# Patient Record
Sex: Female | Born: 1950
Health system: Southern US, Community
[De-identification: ages and names within clinical notes are randomized; demographics above are authoritative.]

## PROBLEM LIST (undated history)

## (undated) DIAGNOSIS — M810 Age-related osteoporosis without current pathological fracture: Secondary | ICD-10-CM

## (undated) DIAGNOSIS — M549 Dorsalgia, unspecified: Secondary | ICD-10-CM

## (undated) DIAGNOSIS — K829 Disease of gallbladder, unspecified: Secondary | ICD-10-CM

## (undated) DIAGNOSIS — E7800 Pure hypercholesterolemia, unspecified: Secondary | ICD-10-CM

## (undated) DIAGNOSIS — Z923 Personal history of irradiation: Secondary | ICD-10-CM

## (undated) DIAGNOSIS — K219 Gastro-esophageal reflux disease without esophagitis: Secondary | ICD-10-CM

## (undated) DIAGNOSIS — R609 Edema, unspecified: Secondary | ICD-10-CM

## (undated) DIAGNOSIS — K5790 Diverticulosis of intestine, part unspecified, without perforation or abscess without bleeding: Secondary | ICD-10-CM

## (undated) DIAGNOSIS — I10 Essential (primary) hypertension: Secondary | ICD-10-CM

## (undated) HISTORY — DX: Edema, unspecified: R60.9

## (undated) HISTORY — PX: BREAST BIOPSY: SHX20

## (undated) HISTORY — DX: Dorsalgia, unspecified: M54.9

## (undated) HISTORY — DX: Disease of gallbladder, unspecified: K82.9

## (undated) HISTORY — DX: Essential (primary) hypertension: I10

## (undated) HISTORY — PX: ABDOMINAL HYSTERECTOMY: SHX81

## (undated) HISTORY — DX: Personal history of irradiation: Z92.3

## (undated) HISTORY — PX: CHOLECYSTECTOMY: SHX55

---

## 1999-05-15 ENCOUNTER — Other Ambulatory Visit: Admission: RE | Admit: 1999-05-15 | Discharge: 1999-05-15 | Payer: Self-pay | Admitting: Family Medicine

## 2000-07-25 ENCOUNTER — Ambulatory Visit (HOSPITAL_COMMUNITY): Admission: RE | Admit: 2000-07-25 | Discharge: 2000-07-25 | Payer: Self-pay | Admitting: Gastroenterology

## 2000-07-25 ENCOUNTER — Encounter: Payer: Self-pay | Admitting: Gastroenterology

## 2002-08-04 ENCOUNTER — Encounter: Admission: RE | Admit: 2002-08-04 | Discharge: 2002-08-04 | Payer: Self-pay | Admitting: Family Medicine

## 2002-08-04 ENCOUNTER — Encounter: Payer: Self-pay | Admitting: Family Medicine

## 2002-08-10 ENCOUNTER — Encounter: Admission: RE | Admit: 2002-08-10 | Discharge: 2002-08-10 | Payer: Self-pay | Admitting: Family Medicine

## 2002-08-10 ENCOUNTER — Encounter: Payer: Self-pay | Admitting: Family Medicine

## 2002-10-18 ENCOUNTER — Encounter (INDEPENDENT_AMBULATORY_CARE_PROVIDER_SITE_OTHER): Payer: Self-pay

## 2002-10-18 ENCOUNTER — Ambulatory Visit (HOSPITAL_COMMUNITY): Admission: RE | Admit: 2002-10-18 | Discharge: 2002-10-18 | Payer: Self-pay | Admitting: Gastroenterology

## 2004-01-17 ENCOUNTER — Encounter (INDEPENDENT_AMBULATORY_CARE_PROVIDER_SITE_OTHER): Payer: Self-pay | Admitting: Specialist

## 2004-01-17 ENCOUNTER — Observation Stay (HOSPITAL_COMMUNITY): Admission: RE | Admit: 2004-01-17 | Discharge: 2004-01-18 | Payer: Self-pay | Admitting: *Deleted

## 2004-12-12 ENCOUNTER — Encounter: Admission: RE | Admit: 2004-12-12 | Discharge: 2004-12-12 | Payer: Self-pay | Admitting: Family Medicine

## 2005-01-10 ENCOUNTER — Emergency Department (HOSPITAL_COMMUNITY): Admission: EM | Admit: 2005-01-10 | Discharge: 2005-01-11 | Payer: Self-pay | Admitting: Emergency Medicine

## 2006-05-19 ENCOUNTER — Other Ambulatory Visit: Admission: RE | Admit: 2006-05-19 | Discharge: 2006-05-19 | Payer: Self-pay | Admitting: Family Medicine

## 2006-06-11 ENCOUNTER — Encounter: Admission: RE | Admit: 2006-06-11 | Discharge: 2006-06-11 | Payer: Self-pay | Admitting: Family Medicine

## 2007-06-18 ENCOUNTER — Encounter: Admission: RE | Admit: 2007-06-18 | Discharge: 2007-06-18 | Payer: Self-pay | Admitting: Family Medicine

## 2008-08-17 ENCOUNTER — Encounter: Admission: RE | Admit: 2008-08-17 | Discharge: 2008-08-17 | Payer: Self-pay | Admitting: Family Medicine

## 2009-08-21 ENCOUNTER — Encounter: Admission: RE | Admit: 2009-08-21 | Discharge: 2009-08-21 | Payer: Self-pay | Admitting: Family Medicine

## 2010-07-17 ENCOUNTER — Ambulatory Visit: Payer: Self-pay | Admitting: Family Medicine

## 2010-08-07 ENCOUNTER — Ambulatory Visit: Payer: Self-pay | Admitting: Family Medicine

## 2010-08-22 ENCOUNTER — Encounter: Admission: RE | Admit: 2010-08-22 | Discharge: 2010-08-22 | Payer: Self-pay | Admitting: Obstetrics and Gynecology

## 2011-03-22 NOTE — Op Note (Signed)
NAME:  Jennifer Tyler, Jennifer Tyler                             ACCOUNT NO.:  [id number]   MEDICAL RECORD NO.:  [id number]                   PATIENT TYPE:  OBV   LOCATION:  0357                                 FACILITY:  [facility]   PHYSICIAN:  Vikki Ports, M.D.         DATE OF BIRTH:  August 27, 1951   DATE OF PROCEDURE:  01/17/2004  DATE OF DISCHARGE:                                 OPERATIVE REPORT   PREOPERATIVE DIAGNOSIS:  Symptomatic cholelithiasis.   POSTOPERATIVE DIAGNOSIS:  Symptomatic cholelithiasis.   PROCEDURE:  Laparoscopic cholecystectomy.   SURGEON:  Vikki Ports, M.D.   ASSESSMENT:  [name]. Earlene Plater, M.D.   ANESTHESIA:  General.   PROCEDURE:  Patient was taken to the operating room and placed in a supine  position.  After adequate general anesthesia was induced using an  endotracheal tube, the abdomen was prepped and draped in a normal sterile  fashion.  Using a supraumbilical incision, I dissected down to the fascia.  This was opened vertically.  An 0 Vicryl purse-string suture was placed  around the fascial defect.  A Hasson trocar was placed in the abdomen, and  the abdomen was insufflated with continuous-flow carbon dioxide.  Under  direct visualization, an 11 mm port was placed in the subxiphoid region, and  two 5 mm ports were placed in the right abdomen.  The gallbladder was  identified and retracted cephalad.  The cystic duct at the junction of the  gallbladder was easily visualized and clipped.  The small ductotomy was  made; however, on inspecting the lumen of the cystic duct, it would not even  allow the pin point of the right angle dissector.  It measured clearly less  than 1 mm, and no catheter could be placed within it distally.  For this  reason, I felt it highly unlikely that the patient would have any  choledocholithiasis, and since cholangiogram could not be performed, the  cystic duct was then triply clipped and divided.  The cystic artery was  identified and dissected in all directions, triply clipped and divided.  The  gallbladder was taken off the gallbladder bed and using Bovie  electrocautery, we removed the umbilical port.  Adequate hemostasis was  insured.  The fascial defect was closed with 0 Vicryl purse-string sutures.  Skin increases were closed with staples.  Patient tolerated the procedure  well and went to the PACU in good condition.                                               Vikki Ports, M.D.    KRH/MEDQ  D:  01/17/2004  T:  01/17/2004  Job:  [id number]

## 2011-03-22 NOTE — Op Note (Signed)
   NAME:  Jennifer Tyler, Jennifer Tyler                             ACCOUNT NO.:  [id number]   MEDICAL RECORD NO.:  [id number]                   PATIENT TYPE:  AMB   LOCATION:  ENDO                                 FACILITY:  [facility]   PHYSICIAN:  John C. Madilyn Fireman, M.D.                 DATE OF BIRTH:  07-Nov-1950   DATE OF PROCEDURE:  10/18/2002  DATE OF DISCHARGE:                                 OPERATIVE REPORT   PROCEDURE:  Colonoscopy with polypectomy.   INDICATIONS FOR PROCEDURE:  Heme-positive stool.   DESCRIPTION OF PROCEDURE:  The patient was placed in the left lateral  decubitus position and placed on the pulse monitor with continuous low-flow  oxygen delivered by nasal cannula.  She was sedated with 75 mcg IV fentanyl  and 6 mg IV Demerol.  The Olympus video colonoscope was inserted into the  rectum and advanced to the cecum, confirmed by transillumination at  McBurney's point and visualization of the ileocecal valve and appendiceal  orifice.  The prep was excellent.  The cecum, ascending, transverse, and  descending colon all appeared normal with no masses, polyps, diverticula, or  other mucosal abnormalities.  Within the sigmoid colon, there was a large,  irregular, friable 2-2.5 cm polyp attached by a long stalk.  This was fairly  easily ensnared and fulgurated with the snare loop and was removed in one  piece through the anus.  The remainder of the sigmoid and rectum appeared  normal with no further polyps, masses, diverticula, or other mucosal  abnormalities.  The scope was then withdrawn, and the patient returned to  the recovery room in stable condition.  She tolerated the procedure well,  and there were no immediate complications.   IMPRESSION:  Large sigmoid colon polyp, rule out malignancy.   PLAN:  Await histology.                                               John C. Madilyn Fireman, M.D.    JCH/MEDQ  D:  10/18/2002  T:  10/18/2002  Job:  [id number]   cc:   Miguel Aschoff, M.D.  [address], [address]  [place]  [place]  [address]  Fax: [phone]

## 2011-06-04 ENCOUNTER — Other Ambulatory Visit: Payer: Self-pay | Admitting: Family Medicine

## 2011-06-04 ENCOUNTER — Other Ambulatory Visit (HOSPITAL_COMMUNITY)
Admission: RE | Admit: 2011-06-04 | Discharge: 2011-06-04 | Disposition: A | Discharge status: Home / Self Care | Payer: 59 | Source: Ambulatory Visit | Attending: Family Medicine | Admitting: Family Medicine

## 2011-06-04 DIAGNOSIS — Z01419 Encounter for gynecological examination (general) (routine) without abnormal findings: Secondary | ICD-10-CM | POA: Insufficient documentation

## 2011-07-26 ENCOUNTER — Other Ambulatory Visit: Payer: Self-pay | Admitting: Obstetrics and Gynecology

## 2011-07-26 DIAGNOSIS — Z1231 Encounter for screening mammogram for malignant neoplasm of breast: Secondary | ICD-10-CM

## 2011-08-28 ENCOUNTER — Ambulatory Visit
Admission: RE | Admit: 2011-08-28 | Discharge: 2011-08-28 | Disposition: A | Discharge status: Home / Self Care | Payer: 59 | Source: Ambulatory Visit | Attending: Obstetrics and Gynecology | Admitting: Obstetrics and Gynecology

## 2011-08-28 DIAGNOSIS — Z1231 Encounter for screening mammogram for malignant neoplasm of breast: Secondary | ICD-10-CM

## 2012-07-27 ENCOUNTER — Other Ambulatory Visit: Payer: Self-pay | Admitting: Family Medicine

## 2012-07-27 DIAGNOSIS — Z1231 Encounter for screening mammogram for malignant neoplasm of breast: Secondary | ICD-10-CM

## 2012-08-28 ENCOUNTER — Ambulatory Visit
Admission: RE | Admit: 2012-08-28 | Discharge: 2012-08-28 | Disposition: A | Discharge status: Home / Self Care | Payer: 59 | Source: Ambulatory Visit | Attending: Family Medicine | Admitting: Family Medicine

## 2012-08-28 DIAGNOSIS — Z1231 Encounter for screening mammogram for malignant neoplasm of breast: Secondary | ICD-10-CM

## 2013-07-28 ENCOUNTER — Other Ambulatory Visit: Payer: Self-pay

## 2013-07-28 DIAGNOSIS — Z1231 Encounter for screening mammogram for malignant neoplasm of breast: Secondary | ICD-10-CM

## 2013-08-31 ENCOUNTER — Ambulatory Visit
Admission: RE | Admit: 2013-08-31 | Discharge: 2013-08-31 | Disposition: A | Discharge status: Home / Self Care | Payer: 59 | Source: Ambulatory Visit

## 2013-08-31 DIAGNOSIS — Z1231 Encounter for screening mammogram for malignant neoplasm of breast: Secondary | ICD-10-CM

## 2014-01-07 ENCOUNTER — Other Ambulatory Visit: Payer: Self-pay | Admitting: Gastroenterology

## 2014-01-07 DIAGNOSIS — R1011 Right upper quadrant pain: Secondary | ICD-10-CM

## 2014-01-13 ENCOUNTER — Ambulatory Visit
Admission: RE | Admit: 2014-01-13 | Discharge: 2014-01-13 | Disposition: A | Discharge status: Home / Self Care | Payer: 59 | Source: Ambulatory Visit | Attending: Gastroenterology | Admitting: Gastroenterology

## 2014-01-13 DIAGNOSIS — R1011 Right upper quadrant pain: Secondary | ICD-10-CM

## 2014-06-14 ENCOUNTER — Other Ambulatory Visit: Payer: Self-pay | Admitting: Nurse Practitioner

## 2014-06-14 DIAGNOSIS — C220 Liver cell carcinoma: Secondary | ICD-10-CM

## 2014-07-29 ENCOUNTER — Other Ambulatory Visit: Payer: Self-pay

## 2014-07-29 DIAGNOSIS — Z1231 Encounter for screening mammogram for malignant neoplasm of breast: Secondary | ICD-10-CM

## 2014-08-18 ENCOUNTER — Ambulatory Visit
Admission: RE | Admit: 2014-08-18 | Discharge: 2014-08-18 | Disposition: A | Discharge status: Home / Self Care | Payer: 59 | Source: Ambulatory Visit | Attending: Nurse Practitioner | Admitting: Nurse Practitioner

## 2014-08-18 DIAGNOSIS — C220 Liver cell carcinoma: Secondary | ICD-10-CM

## 2014-09-01 ENCOUNTER — Ambulatory Visit
Admission: RE | Admit: 2014-09-01 | Discharge: 2014-09-01 | Disposition: A | Discharge status: Home / Self Care | Payer: 59 | Source: Ambulatory Visit

## 2014-09-01 DIAGNOSIS — Z1231 Encounter for screening mammogram for malignant neoplasm of breast: Secondary | ICD-10-CM

## 2014-10-25 ENCOUNTER — Other Ambulatory Visit: Payer: Self-pay | Admitting: Nurse Practitioner

## 2014-10-25 DIAGNOSIS — C220 Liver cell carcinoma: Secondary | ICD-10-CM

## 2014-11-04 HISTORY — PX: BUNIONECTOMY: SHX129

## 2015-02-06 ENCOUNTER — Ambulatory Visit
Admission: RE | Admit: 2015-02-06 | Discharge: 2015-02-06 | Disposition: A | Discharge status: Home / Self Care | Payer: 59 | Source: Ambulatory Visit | Attending: Nurse Practitioner | Admitting: Nurse Practitioner

## 2015-02-06 DIAGNOSIS — C220 Liver cell carcinoma: Secondary | ICD-10-CM

## 2015-02-14 ENCOUNTER — Other Ambulatory Visit: Payer: 59

## 2015-02-14 ENCOUNTER — Other Ambulatory Visit: Payer: Self-pay | Admitting: Nurse Practitioner

## 2015-02-14 DIAGNOSIS — K769 Liver disease, unspecified: Secondary | ICD-10-CM

## 2015-03-06 ENCOUNTER — Other Ambulatory Visit: Payer: Self-pay | Admitting: Nurse Practitioner

## 2015-03-06 DIAGNOSIS — K769 Liver disease, unspecified: Secondary | ICD-10-CM

## 2015-03-07 ENCOUNTER — Ambulatory Visit
Admission: RE | Admit: 2015-03-07 | Discharge: 2015-03-07 | Disposition: A | Discharge status: Home / Self Care | Payer: 59 | Source: Ambulatory Visit | Attending: Nurse Practitioner | Admitting: Nurse Practitioner

## 2015-03-07 ENCOUNTER — Other Ambulatory Visit: Payer: 59

## 2015-03-07 DIAGNOSIS — K769 Liver disease, unspecified: Secondary | ICD-10-CM

## 2015-03-07 MED ORDER — IOPAMIDOL (ISOVUE-300) INJECTION 61%
100.0000 mL | Freq: Once | INTRAVENOUS | Status: AC | PRN
  Administered 2015-03-07: 100 mL via INTRAVENOUS

## 2015-04-13 ENCOUNTER — Ambulatory Visit
Admission: RE | Admit: 2015-04-13 | Discharge: 2015-04-13 | Disposition: A | Discharge status: Home / Self Care | Payer: 59 | Source: Ambulatory Visit | Attending: Nurse Practitioner | Admitting: Nurse Practitioner

## 2015-04-13 DIAGNOSIS — K769 Liver disease, unspecified: Secondary | ICD-10-CM

## 2015-04-13 MED ORDER — GADOXETATE DISODIUM 0.25 MMOL/ML IV SOLN
8.0000 mL | Freq: Once | INTRAVENOUS | Status: AC | PRN
  Administered 2015-04-13: 8 mL via INTRAVENOUS

## 2015-08-01 ENCOUNTER — Other Ambulatory Visit: Payer: Self-pay

## 2015-08-01 DIAGNOSIS — Z1231 Encounter for screening mammogram for malignant neoplasm of breast: Secondary | ICD-10-CM

## 2015-09-06 ENCOUNTER — Ambulatory Visit
Admission: RE | Admit: 2015-09-06 | Discharge: 2015-09-06 | Disposition: A | Discharge status: Home / Self Care | Payer: 59 | Source: Ambulatory Visit

## 2015-09-06 DIAGNOSIS — Z1231 Encounter for screening mammogram for malignant neoplasm of breast: Secondary | ICD-10-CM

## 2016-08-26 ENCOUNTER — Other Ambulatory Visit: Payer: Self-pay | Admitting: Family Medicine

## 2016-08-26 DIAGNOSIS — Z1231 Encounter for screening mammogram for malignant neoplasm of breast: Secondary | ICD-10-CM

## 2016-09-16 ENCOUNTER — Ambulatory Visit
Admission: RE | Admit: 2016-09-16 | Discharge: 2016-09-16 | Disposition: A | Discharge status: Home / Self Care | Payer: 59 | Source: Ambulatory Visit | Attending: Family Medicine | Admitting: Family Medicine

## 2016-09-16 DIAGNOSIS — Z1231 Encounter for screening mammogram for malignant neoplasm of breast: Secondary | ICD-10-CM

## 2017-08-15 ENCOUNTER — Other Ambulatory Visit: Payer: Self-pay | Admitting: Family Medicine

## 2017-08-15 DIAGNOSIS — Z1231 Encounter for screening mammogram for malignant neoplasm of breast: Secondary | ICD-10-CM

## 2017-09-17 ENCOUNTER — Ambulatory Visit
Admission: RE | Admit: 2017-09-17 | Discharge: 2017-09-17 | Disposition: A | Discharge status: Home / Self Care | Payer: Medicare Other | Source: Ambulatory Visit | Attending: Family Medicine | Admitting: Family Medicine

## 2017-09-17 DIAGNOSIS — Z1231 Encounter for screening mammogram for malignant neoplasm of breast: Secondary | ICD-10-CM

## 2018-08-18 ENCOUNTER — Other Ambulatory Visit: Payer: Self-pay | Admitting: Family Medicine

## 2018-08-18 DIAGNOSIS — Z1231 Encounter for screening mammogram for malignant neoplasm of breast: Secondary | ICD-10-CM

## 2018-09-25 ENCOUNTER — Ambulatory Visit
Admission: RE | Admit: 2018-09-25 | Discharge: 2018-09-25 | Disposition: A | Discharge status: Home / Self Care | Payer: Medicare Other | Source: Ambulatory Visit | Attending: Family Medicine | Admitting: Family Medicine

## 2018-09-25 DIAGNOSIS — Z1231 Encounter for screening mammogram for malignant neoplasm of breast: Secondary | ICD-10-CM

## 2018-11-20 ENCOUNTER — Encounter: Payer: Self-pay | Admitting: Family Medicine

## 2018-12-01 ENCOUNTER — Other Ambulatory Visit: Payer: Self-pay | Admitting: Family Medicine

## 2018-12-01 DIAGNOSIS — E2839 Other primary ovarian failure: Secondary | ICD-10-CM

## 2018-12-17 ENCOUNTER — Encounter (INDEPENDENT_AMBULATORY_CARE_PROVIDER_SITE_OTHER): Payer: Self-pay

## 2018-12-24 ENCOUNTER — Encounter (INDEPENDENT_AMBULATORY_CARE_PROVIDER_SITE_OTHER): Payer: Self-pay | Admitting: Family Medicine

## 2018-12-24 ENCOUNTER — Ambulatory Visit (INDEPENDENT_AMBULATORY_CARE_PROVIDER_SITE_OTHER): Payer: Medicare Other | Admitting: Family Medicine

## 2018-12-24 VITALS — BP 122/74 | HR 64 | Temp 97.6°F | Ht 59.0 in | Wt 193.0 lb

## 2018-12-24 DIAGNOSIS — Z6839 Body mass index (BMI) 39.0-39.9, adult: Secondary | ICD-10-CM | POA: Diagnosis not present

## 2018-12-24 DIAGNOSIS — I10 Essential (primary) hypertension: Secondary | ICD-10-CM | POA: Diagnosis not present

## 2018-12-24 DIAGNOSIS — R0602 Shortness of breath: Secondary | ICD-10-CM

## 2018-12-24 DIAGNOSIS — R5383 Other fatigue: Secondary | ICD-10-CM

## 2018-12-24 DIAGNOSIS — E559 Vitamin D deficiency, unspecified: Secondary | ICD-10-CM | POA: Diagnosis not present

## 2018-12-24 DIAGNOSIS — Z1331 Encounter for screening for depression: Secondary | ICD-10-CM | POA: Diagnosis not present

## 2018-12-24 DIAGNOSIS — Z0289 Encounter for other administrative examinations: Secondary | ICD-10-CM

## 2018-12-25 LAB — CBC WITH DIFFERENTIAL/PLATELET
BASOS: 1 %
Basophils Absolute: 0 10*3/uL (ref 0.0–0.2)
EOS (ABSOLUTE): 0 10*3/uL (ref 0.0–0.4)
Eos: 1 %
HEMOGLOBIN: 14.2 g/dL (ref 11.1–15.9)
Hematocrit: 42.5 % (ref 34.0–46.6)
IMMATURE GRANS (ABS): 0 10*3/uL (ref 0.0–0.1)
Immature Granulocytes: 0 %
Lymphocytes Absolute: 1.5 10*3/uL (ref 0.7–3.1)
Lymphs: 33 %
MCH: 28.3 pg (ref 26.6–33.0)
MCHC: 33.4 g/dL (ref 31.5–35.7)
MCV: 85 fL (ref 79–97)
MONOCYTES: 9 %
Monocytes Absolute: 0.4 10*3/uL (ref 0.1–0.9)
NEUTROS ABS: 2.6 10*3/uL (ref 1.4–7.0)
Neutrophils: 56 %
Platelets: 271 10*3/uL (ref 150–450)
RBC: 5.02 x10E6/uL (ref 3.77–5.28)
RDW: 12.7 % (ref 11.7–15.4)
WBC: 4.6 10*3/uL (ref 3.4–10.8)

## 2018-12-25 LAB — T3: T3 TOTAL: 124 ng/dL (ref 71–180)

## 2018-12-25 LAB — T4, FREE: Free T4: 1.32 ng/dL (ref 0.82–1.77)

## 2018-12-25 LAB — INSULIN, RANDOM: INSULIN: 11.9 u[IU]/mL (ref 2.6–24.9)

## 2018-12-25 LAB — HEMOGLOBIN A1C
Est. average glucose Bld gHb Est-mCnc: 100 mg/dL
HEMOGLOBIN A1C: 5.1 % (ref 4.8–5.6)

## 2018-12-25 LAB — TSH: TSH: 0.461 u[IU]/mL (ref 0.450–4.500)

## 2018-12-25 LAB — VITAMIN D 25 HYDROXY (VIT D DEFICIENCY, FRACTURES): VIT D 25 HYDROXY: 22.5 ng/mL — AB (ref 30.0–100.0)

## 2018-12-27 NOTE — Progress Notes (Signed)
Office: [phone]  /  Fax: [phone]   Dear Dr. Harrington Challenger,   Thank you for referring Jennifer Tyler to our clinic. The following note includes my evaluation and treatment recommendations.  HPI:   Chief Complaint: OBESITY    Jennifer Tyler has been referred by Lawerance Cruel, MD for consultation regarding her obesity and obesity related comorbidities.    Jennifer Tyler (MR# [id number]) is a 68 y.o. female who presents on 12/24/2018 for obesity evaluation and treatment. Current BMI is Body mass index is 38.98 kg/m.Jennifer Tyler has been struggling with her weight for many years and has been unsuccessful in either losing weight, maintaining weight loss, or reaching her healthy weight goal.     Jennifer Tyler has a history of phentermine use. She has a birthday party this weekend.     Marlaysia attended our information session and states she is currently in the action stage of change and ready to dedicate time achieving and maintaining a healthier weight. [name] is interested in becoming our patient and working on intensive lifestyle modifications including (but not limited to) diet, exercise and weight loss.    Jennifer Tyler states her family eats meals together she thinks her family will eat healthier with  her her desired weight loss is 23-28 lbs she has been heavy most of  her life she started gaining weight after her babies were born her heaviest weight ever was 230 lbs she has significant food cravings issues  she snacks frequently in the evenings she is trying to eat vegetarian she is frequently drinking liquids with calories she frequently makes poor food choices she frequently eats larger portions than normal  she struggles with emotional eating    Jennifer Tyler feels her energy is lower than it should be. This has worsened with weight gain and has not worsened recently. [name] admits to daytime somnolence and  denies waking up still tired. Patient is at risk for obstructive sleep apnea. Patent has a history of  symptoms of daytime Jennifer. Patient generally gets 5 hours of sleep per night, and states they generally have difficulty falling asleep. Snoring is present. Apneic episodes are not present. Epworth Sleepiness Score is 3.  Dyspnea on exertion Jennifer Tyler notes increasing shortness of breath with exercising and seems to be worsening over time with weight gain. She notes getting out of breath sooner with activity than she used to. This has not gotten worse recently. EKG-Normal sinus rhythm with poor R wave progression. Jennifer Tyler denies orthopnea.  Vitamin D Deficiency Jennifer Tyler has a diagnosis of vitamin D deficiency. She is currently taking OTC Vit D and denies nausea, vomiting or muscle weakness.  Hypertension Jennifer Tyler is a 68 y.o. female with hypertension for approximately 10 years. Jennifer Tyler's blood pressure is normally controlled (controlled today). She denies chest pain, chest pressure, or headaches. She is working on weight loss to help control her blood pressure with the goal of decreasing her risk of heart attack and stroke.   Depression Screen Jennifer Tyler's Food and Mood (modified PHQ-9) score was  Depression screen PHQ 2/9 12/24/2018  Decreased Interest 1  Down, Depressed, Hopeless 0  PHQ - 2 Score 1  Altered sleeping 1  Tired, decreased energy 1  Change in appetite 1  Feeling bad or failure about yourself  1  Trouble concentrating 1  Moving slowly or fidgety/restless 0  Suicidal thoughts 0  PHQ-9 Score 6  Difficult doing work/chores Not difficult at all    ASSESSMENT AND PLAN:  Other Jennifer -  Plan: EKG 12-Lead, CBC with Differential/Platelet, Hemoglobin A1c, Insulin, random, T3, T4, free, TSH  Shortness of breath on exertion  Vitamin D deficiency - Plan: VITAMIN D 25 Hydroxy (Vit-D Deficiency, Fractures)  Essential hypertension  Depression screening  Class 2 severe obesity with serious comorbidity and body mass index (BMI) of 39.0 to 39.9 in adult, unspecified obesity type  ([place])  PLAN:  Jennifer Jennifer Tyler was informed that her Jennifer may be related to obesity, depression or many other causes. Labs will be ordered, and in the meanwhile Jennifer Tyler has agreed to work on diet, exercise and weight loss to help with Jennifer. Proper sleep hygiene was discussed including the need for 7-8 hours of quality sleep each night. A sleep study was not ordered based on symptoms and Epworth score.  Dyspnea on exertion Jennifer Tyler's shortness of breath appears to be obesity related and exercise induced. She has agreed to work on weight loss and gradually increase exercise to treat her exercise induced shortness of breath. If Jennifer Tyler follows our instructions and loses weight without improvement of her shortness of breath, we will plan to refer to pulmonology. We will monitor this condition regularly. Jennifer Tyler agrees to this plan.  Vitamin D Deficiency Jennifer Tyler was informed that low vitamin D levels contributes to Jennifer and are associated with obesity, breast, and colon cancer. She will follow up for routine testing of vitamin D, at least 2-3 times per year. She was informed of the risk of over-replacement of vitamin D and agrees to not increase her dose unless she discusses this with [name] first. We will check Vit D level today. Jennifer Tyler agrees to follow up with our clinic in 2 weeks.  Hypertension We discussed sodium restriction, working on healthy weight loss, and a regular exercise program as the means to achieve improved blood pressure control. Jennifer Tyler agreed with this plan and agreed to follow up as directed. We will continue to monitor her blood pressure as well as her progress with the above lifestyle modifications. Jennifer Tyler will continue her medications as prescribed and will watch for signs of hypotension as she continues her lifestyle modifications. Jennifer Tyler agrees to follow up with our clinic in 2 weeks.  Depression Screen Jennifer Tyler had a mildly positive depression screening. Depression is commonly associated with obesity  and often results in emotional eating behaviors. We will monitor this closely and work on CBT to help improve the non-hunger eating patterns. Referral to Psychology may be required if no improvement is seen as she continues in our clinic.  Obesity Madyson is currently in the action stage of change and her goal is to continue with weight loss efforts. I recommend Mahala begin the structured treatment plan as follows:  She has agreed to follow the Category 2 plan  Arnelle was given the option of 2 oz of lean meat instead of eggs and 1 sweet tea on weekends. Vylette has been instructed to eventually work up to a goal of 150 minutes of combined cardio and strengthening exercise per week for weight loss and overall health benefits. We discussed the following Behavioral Modification Strategies today: increasing lean protein intake, increasing vegetables and work on meal planning and easy cooking plans, and planning for success   She was informed of the importance of frequent follow up visits to maximize her success with intensive lifestyle modifications for her multiple health conditions. She was informed we would discuss her lab results at her next visit unless there is a critical issue that needs to be addressed sooner. Lynisha agreed  to keep her next visit at the agreed upon time to discuss these results.  ALLERGIES: No Known Allergies  MEDICATIONS: Current Outpatient Medications on File Prior to Visit  Medication Sig Dispense Refill  . Ascorbic Acid (VITAMIN C) 100 MG tablet Take 100 mg by mouth daily.    Jennifer Kitchen aspirin EC 81 MG tablet Take 81 mg by mouth daily.    . cholecalciferol (VITAMIN D3) 25 MCG (1000 UT) tablet Take 2,000 Units by mouth daily.    . hydrochlorothiazide (HYDRODIURIL) 25 MG tablet Take 25 mg by mouth daily.    Jennifer Kitchen lisinopril (PRINIVIL,ZESTRIL) 10 MG tablet Take 10 mg by mouth daily.    . magnesium gluconate (MAGONATE) 500 MG tablet Take 500 mg by mouth 2 (two) times daily.    . Multiple  Vitamins-Minerals (MULTIVITAMIN WITH MINERALS) tablet Take 1 tablet by mouth daily.    . naproxen sodium (ALEVE) 220 MG tablet Take 220 mg by mouth.    . vitamin B-12 (CYANOCOBALAMIN) 250 MCG tablet Take 250 mcg by mouth daily.     No current facility-administered medications on file prior to visit.     PAST MEDICAL HISTORY: Past Medical History:  Diagnosis Date  . Back pain   . Gallbladder problem   . Hypertension   . Swelling     PAST SURGICAL HISTORY: Past Surgical History:  Procedure Laterality Date  . ABDOMINAL HYSTERECTOMY    . BUNIONECTOMY  2016   R  . CHOLECYSTECTOMY      SOCIAL HISTORY: Social History   Tobacco Use  . Smoking status: Never Smoker  . Smokeless tobacco: Never Used  Substance Use Topics  . Alcohol use: Not Currently  . Drug use: Never    FAMILY HISTORY: Family History  Problem Relation Age of Onset  . Heart disease Mother   . Breast cancer [name]     ROS: Review of Systems  Constitutional: Positive for malaise/Jennifer. Negative for weight loss.       + Trouble sleeping  HENT:       + Dentures + Dry mouth  Eyes:       + Wear glasses or contacts  Respiratory: Positive for shortness of breath (with exertion).   Cardiovascular: Negative for chest pain and orthopnea.       Negative chest pressure + Leg cramping  Gastrointestinal: Negative for nausea and vomiting.  Musculoskeletal:       Negative muscle weakness  Neurological: Negative for headaches.    PHYSICAL EXAM: Blood pressure 122/74, pulse 64, temperature 97.6 F (36.4 C), temperature source Oral, height 4\' 11"  (1.499 m), weight 193 lb (87.5 kg), SpO2 98 %. Body mass index is 38.98 kg/m. Physical Exam Vitals signs reviewed.  Constitutional:      Appearance: Normal appearance. She is obese.  HENT:     Head: Normocephalic and atraumatic.     Nose: Nose normal.  Eyes:     General: No scleral icterus.    Extraocular Movements: Extraocular movements intact.  Neck:      Musculoskeletal: Normal range of motion and neck supple.     Comments: No thyromegaly present Cardiovascular:     Rate and Rhythm: Normal rate and regular rhythm.     Pulses: Normal pulses.     Heart sounds: Normal heart sounds.  Pulmonary:     Effort: Pulmonary effort is normal. No respiratory distress.     Breath sounds: Normal breath sounds.  Abdominal:     Palpations: Abdomen is soft.  Tenderness: There is no abdominal tenderness.     Comments: + Obesity  Musculoskeletal: Normal range of motion.     Right lower leg: No edema.     Left lower leg: No edema.  Skin:    General: Skin is warm and dry.  Neurological:     Mental Status: She is alert and oriented to person, place, and time.     Coordination: Coordination normal.  Psychiatric:        Mood and Affect: Mood normal.        Behavior: Behavior normal.     RECENT LABS AND TESTS: BMET No results found for: NA, K, CL, CO2, GLUCOSE, BUN, CREATININE, CALCIUM, GFRNONAA, GFRAA Lab Results  Component Value Date   HGBA1C 5.1 12/24/2018   Lab Results  Component Value Date   INSULIN 11.9 12/24/2018   CBC    Component Value Date/Time   WBC 4.6 12/24/2018 1456   RBC 5.02 12/24/2018 1456   HGB 14.2 12/24/2018 1456   HCT 42.5 12/24/2018 1456   PLT 271 12/24/2018 1456   MCV 85 12/24/2018 1456   MCH 28.3 12/24/2018 1456   MCHC 33.4 12/24/2018 1456   RDW 12.7 12/24/2018 1456   LYMPHSABS 1.5 12/24/2018 1456   EOSABS 0.0 12/24/2018 1456   BASOSABS 0.0 12/24/2018 1456   Iron/TIBC/Ferritin/ %Sat No results found for: IRON, TIBC, FERRITIN, IRONPCTSAT Lipid Panel  No results found for: CHOL, TRIG, HDL, CHOLHDL, VLDL, LDLCALC, LDLDIRECT Hepatic Function Panel  No results found for: PROT, ALBUMIN, AST, ALT, ALKPHOS, BILITOT, BILIDIR, IBILI    Component Value Date/Time   TSH 0.461 12/24/2018 1456    ECG  shows NSR with a rate of 66 BPM INDIRECT CALORIMETER done today shows a VO2 of 171 and a REE of 1191.  Her  calculated basal metabolic rate is 8469 thus her basal metabolic rate is worse than expected.       OBESITY BEHAVIORAL INTERVENTION VISIT  Today's visit was # 1   Starting weight: 193 lbs Starting date: 12/24/2018 Today's weight : 193 lbs  Today's date: 12/24/2018 Total lbs lost to date: 0 At least 15 minutes were spent on discussing the following behavioral intervention visit.   ASK: We discussed the diagnosis of obesity with Oneal Deputy today and Lashya agreed to give [name] permission to discuss obesity behavioral modification therapy today.  ASSESS: Dejanae has the diagnosis of obesity and her BMI today is 38.96 Nevaya is in the action stage of change   ADVISE: Cecilia was educated on the multiple health risks of obesity as well as the benefit of weight loss to improve her health. She was advised of the need for long term treatment and the importance of lifestyle modifications to improve her current health and to decrease her risk of future health problems.  AGREE: Multiple dietary modification options and treatment options were discussed and  Consuela agreed to follow the recommendations documented in the above note.  ARRANGE: Nicoletta was educated on the importance of frequent visits to treat obesity as outlined per CMS and USPSTF guidelines and agreed to schedule her next follow up appointment today.  I, Trixie Dredge, am acting as transcriptionist for Ilene Qua, MD   I have reviewed the above documentation for accuracy and completeness, and I agree with the above. - Ilene Qua, MD

## 2019-01-07 ENCOUNTER — Ambulatory Visit (INDEPENDENT_AMBULATORY_CARE_PROVIDER_SITE_OTHER): Payer: Medicare Other | Admitting: Family Medicine

## 2019-01-07 ENCOUNTER — Encounter (INDEPENDENT_AMBULATORY_CARE_PROVIDER_SITE_OTHER): Payer: Self-pay | Admitting: Family Medicine

## 2019-01-07 VITALS — BP 113/75 | HR 78 | Temp 98.4°F | Ht 59.0 in | Wt 193.0 lb

## 2019-01-07 DIAGNOSIS — E559 Vitamin D deficiency, unspecified: Secondary | ICD-10-CM

## 2019-01-07 DIAGNOSIS — E8881 Metabolic syndrome: Secondary | ICD-10-CM | POA: Diagnosis not present

## 2019-01-07 DIAGNOSIS — Z6839 Body mass index (BMI) 39.0-39.9, adult: Secondary | ICD-10-CM | POA: Diagnosis not present

## 2019-01-07 MED ORDER — VITAMIN D (ERGOCALCIFEROL) 1.25 MG (50000 UNIT) PO CAPS: 50000.0000 [IU] | ORAL_CAPSULE | ORAL | 0 refills | Status: DC

## 2019-01-09 NOTE — Progress Notes (Signed)
Office: [phone]  /  Fax: [phone]   HPI:   Chief Complaint: OBESITY Jennifer Tyler is here to discuss her progress with her obesity treatment plan. She is on the Category 2 plan and is following her eating plan approximately 80 % of the time. She states she is doing zumba and aerobics for 30-45 minutes 4 times per week. Jennifer Tyler found if she did steamed vegetables it was too much vegetables. She was trying to decrease fruit but would eat oil and vinegar for salad dressing. She denies hunger and all the food the patient likes.  Her weight is 193 lb (87.5 kg) today and has not lost weight since her last visit. She has lost 0 lbs since starting treatment with [name].  Insulin Resistance Jennifer Tyler has a diagnosis of insulin resistance based on her elevated fasting insulin level >5. Insulin level of 11.9. Although Jennifer Tyler's blood glucose readings are still under good control, insulin resistance puts her at greater risk of metabolic syndrome and diabetes. She is not taking metformin currently and continues to work on diet and exercise to decrease risk of diabetes.  Vitamin D Deficiency Jennifer Tyler has a diagnosis of vitamin D deficiency. She is occasionally taking OTC Vit D 2,000 IU daily. She notes fatigue and denies nausea, vomiting or muscle weakness.  ASSESSMENT AND PLAN:  Insulin resistance  Vitamin D deficiency - Plan: Vitamin D, Ergocalciferol, (DRISDOL) 1.25 MG (50000 UT) CAPS capsule  Class 2 severe obesity with serious comorbidity and body mass index (BMI) of 39.0 to 39.9 in adult, unspecified obesity type (HCC)  PLAN:  Insulin Resistance Jennifer Tyler will continue to work on weight loss, exercise, and decreasing simple carbohydrates in her diet to help decrease the risk of diabetes. We dicussed metformin including benefits and risks. She was informed that eating too many simple carbohydrates or too many calories at one sitting increases the likelihood of GI side effects. Jennifer Tyler declined metformin for now and  prescription was not written today. We will retest Hgb A1c and insulin in 3 months. Jennifer Tyler agreed to follow up with [name] as directed to monitor her progress.  Vitamin D Deficiency Jennifer Tyler was informed that low vitamin D levels contributes to fatigue and are associated with obesity, breast, and colon cancer. Jennifer Tyler agrees to stop OTC Vit D and she agrees to start prescription Vit D @50 ,000 IU every week #4 with no refills. She will follow up for routine testing of vitamin D, at least 2-3 times per year. She was informed of the risk of over-replacement of vitamin D and agrees to not increase her dose unless she discusses this with [name] first. Jennifer Tyler agrees to follow up with our clinic in 2 weeks.  Obesity Jennifer Tyler is currently in the action stage of change. As such, her goal is to continue with weight loss efforts She has agreed to follow the Category 2 plan Jennifer Tyler has been instructed to work up to a goal of 150 minutes of combined cardio and strengthening exercise per week for weight loss and overall health benefits. We discussed the following Behavioral Modification Strategies today: increasing lean protein intake, increasing vegetables and work on meal planning and easy cooking plans, and planning for success   Jennifer Tyler has agreed to follow up with our clinic in 2 weeks. She was informed of the importance of frequent follow up visits to maximize her success with intensive lifestyle modifications for her multiple health conditions.  ALLERGIES: No Known Allergies  MEDICATIONS: Current Outpatient Medications on File Prior to Visit  Medication Sig Dispense Refill  . Ascorbic Acid (VITAMIN C) 100 MG tablet Take 100 mg by mouth daily.    Jennifer Tyler aspirin EC 81 MG tablet Take 81 mg by mouth daily.    . Jennifer Tyler (VITAMIN D3) 25 MCG (1000 UT) tablet Take 2,000 Units by mouth daily.    . hydrochlorothiazide (HYDRODIURIL) 25 MG tablet Take 25 mg by mouth daily.    Jennifer Tyler lisinopril (PRINIVIL,ZESTRIL) 10 MG tablet Take 10 mg by  mouth daily.    . magnesium gluconate (MAGONATE) 500 MG tablet Take 500 mg by mouth 2 (two) times daily.    . Multiple Vitamins-Minerals (MULTIVITAMIN WITH MINERALS) tablet Take 1 tablet by mouth daily.    . naproxen sodium (ALEVE) 220 MG tablet Take 220 mg by mouth.    . vitamin B-12 (CYANOCOBALAMIN) 250 MCG tablet Take 250 mcg by mouth daily.     No current facility-administered medications on file prior to visit.     PAST MEDICAL HISTORY: Past Medical History:  Diagnosis Date  . Back pain   . Gallbladder problem   . Hypertension   . Swelling     PAST SURGICAL HISTORY: Past Surgical History:  Procedure Laterality Date  . ABDOMINAL HYSTERECTOMY    . BUNIONECTOMY  2016   R  . CHOLECYSTECTOMY      SOCIAL HISTORY: Social History   Tobacco Use  . Smoking status: Never Smoker  . Smokeless tobacco: Never Used  Substance Use Topics  . Alcohol use: Not Currently  . Drug use: Never    FAMILY HISTORY: Family History  Problem Relation Age of Onset  . Heart disease Mother   . Breast cancer [name]     ROS: Review of Systems  Constitutional: Positive for malaise/fatigue. Negative for weight loss.  Gastrointestinal: Negative for nausea and vomiting.  Musculoskeletal:       Negative muscle weakness    PHYSICAL EXAM: Blood pressure 113/75, pulse 78, temperature 98.4 F (36.9 C), temperature source Oral, height 4\' 11"  (1.499 m), weight 193 lb (87.5 kg), SpO2 97 %. Body mass index is 38.98 kg/m. Physical Exam Vitals signs reviewed.  Constitutional:      Appearance: Normal appearance. She is obese.  Cardiovascular:     Rate and Rhythm: Normal rate.     Pulses: Normal pulses.  Pulmonary:     Effort: Pulmonary effort is normal.     Breath sounds: Normal breath sounds.  Musculoskeletal: Normal range of motion.  Skin:    General: Skin is warm and dry.  Neurological:     Mental Status: She is alert and oriented to person, place, and time.  Psychiatric:        Mood  and Affect: Mood normal.        Behavior: Behavior normal.     RECENT LABS AND TESTS: BMET No results found for: NA, K, CL, CO2, GLUCOSE, BUN, CREATININE, CALCIUM, GFRNONAA, GFRAA Lab Results  Component Value Date   HGBA1C 5.1 12/24/2018   Lab Results  Component Value Date   INSULIN 11.9 12/24/2018   CBC    Component Value Date/Time   WBC 4.6 12/24/2018 1456   RBC 5.02 12/24/2018 1456   HGB 14.2 12/24/2018 1456   HCT 42.5 12/24/2018 1456   PLT 271 12/24/2018 1456   MCV 85 12/24/2018 1456   MCH 28.3 12/24/2018 1456   MCHC 33.4 12/24/2018 1456   RDW 12.7 12/24/2018 1456   LYMPHSABS 1.5 12/24/2018 1456   EOSABS 0.0 12/24/2018 1456  BASOSABS 0.0 12/24/2018 1456   Iron/TIBC/Ferritin/ %Sat No results found for: IRON, TIBC, FERRITIN, IRONPCTSAT Lipid Panel  No results found for: CHOL, TRIG, HDL, CHOLHDL, VLDL, LDLCALC, LDLDIRECT Hepatic Function Panel  No results found for: PROT, ALBUMIN, AST, ALT, ALKPHOS, BILITOT, BILIDIR, IBILI    Component Value Date/Time   TSH 0.461 12/24/2018 1456      OBESITY BEHAVIORAL INTERVENTION VISIT  Today's visit was # 2   Starting weight: 193 lbs Starting date: 12/24/2018 Today's weight : 193 lbs  Today's date: 01/07/2019 Total lbs lost to date: 0 At least 15 minutes were spent on discussing the following behavioral intervention visit.    01/07/2019  Height 4\' 11"  (1.499 m)  Weight 193 lb (87.5 kg)  BMI (Calculated) 38.96  BLOOD PRESSURE - SYSTOLIC 161  BLOOD PRESSURE - DIASTOLIC 75   Body Fat % 09.6 %  Total Body Water (lbs) 70.6 lbs    ASK: We discussed the diagnosis of obesity with Oneal Deputy today and Elena agreed to give [name] permission to discuss obesity behavioral modification therapy today.  ASSESS: Glorya has the diagnosis of obesity and her BMI today is 38.96 Fawne is in the action stage of change   ADVISE: Jnya was educated on the multiple health risks of obesity as well as the benefit of weight loss to improve her  health. She was advised of the need for long term treatment and the importance of lifestyle modifications to improve her current health and to decrease her risk of future health problems.  AGREE: Multiple dietary modification options and treatment options were discussed and  Williette agreed to follow the recommendations documented in the above note.  ARRANGE: Chika was educated on the importance of frequent visits to treat obesity as outlined per CMS and USPSTF guidelines and agreed to schedule her next follow up appointment today.  I, Trixie Dredge, am acting as transcriptionist for Ilene Qua, MD  I have reviewed the above documentation for accuracy and completeness, and I agree with the above. - Ilene Qua, MD

## 2019-01-27 ENCOUNTER — Ambulatory Visit (INDEPENDENT_AMBULATORY_CARE_PROVIDER_SITE_OTHER): Payer: Medicare Other | Admitting: Family Medicine

## 2019-01-28 ENCOUNTER — Encounter (INDEPENDENT_AMBULATORY_CARE_PROVIDER_SITE_OTHER): Payer: Self-pay

## 2019-02-03 ENCOUNTER — Encounter (INDEPENDENT_AMBULATORY_CARE_PROVIDER_SITE_OTHER): Payer: Self-pay

## 2019-02-03 ENCOUNTER — Other Ambulatory Visit (INDEPENDENT_AMBULATORY_CARE_PROVIDER_SITE_OTHER): Payer: Self-pay | Admitting: Family Medicine

## 2019-02-03 DIAGNOSIS — E559 Vitamin D deficiency, unspecified: Secondary | ICD-10-CM

## 2019-02-08 ENCOUNTER — Encounter (INDEPENDENT_AMBULATORY_CARE_PROVIDER_SITE_OTHER): Payer: Self-pay | Admitting: Family Medicine

## 2019-02-08 ENCOUNTER — Other Ambulatory Visit: Payer: Self-pay

## 2019-02-08 ENCOUNTER — Ambulatory Visit (INDEPENDENT_AMBULATORY_CARE_PROVIDER_SITE_OTHER): Payer: Medicare Other | Admitting: Family Medicine

## 2019-02-08 DIAGNOSIS — Z6838 Body mass index (BMI) 38.0-38.9, adult: Secondary | ICD-10-CM | POA: Diagnosis not present

## 2019-02-08 DIAGNOSIS — E8881 Metabolic syndrome: Secondary | ICD-10-CM | POA: Diagnosis not present

## 2019-02-08 DIAGNOSIS — E559 Vitamin D deficiency, unspecified: Secondary | ICD-10-CM

## 2019-02-08 DIAGNOSIS — E88819 Insulin resistance, unspecified: Secondary | ICD-10-CM

## 2019-02-08 DIAGNOSIS — E66812 Obesity, class 2: Secondary | ICD-10-CM

## 2019-02-08 MED ORDER — VITAMIN D (ERGOCALCIFEROL) 1.25 MG (50000 UNIT) PO CAPS: 50000.0000 [IU] | ORAL_CAPSULE | ORAL | 0 refills | Status: DC

## 2019-02-08 NOTE — Progress Notes (Signed)
Office: [phone]  /  Fax: [phone] TeleHealth Visit:  ALANTRA POPOCA has verbally consented to this TeleHealth visit today. The patient is located at home, the provider is located at the [facility] office. The participants in this visit include the listed provider and patient and provider's assistant. The visit was conducted today via webex.  HPI:   Chief Complaint: OBESITY Yatziry is here to discuss her progress with her obesity treatment plan. She is on the Category 2 plan and is following her eating plan approximately 60 % of the time. She states she is walking for 10-15 minutes 7 times per week. Emaly has been trying for the past 2 weeks. She does ok some days and some days she gets off track. She notes stress has definitely played a part in her difficult time following the plan. She has had some indulgent eating.  We were unable to weigh the patient today for this TeleHealth visit. She feels as if she has maintained her weight since her last visit. She has lost 0 lbs since starting treatment with [name].  Vitamin D Deficiency Jennice has a diagnosis of vitamin D deficiency. She is currently taking prescription Vit D. She notes fatigue and denies nausea, vomiting or muscle weakness.  Insulin Resistance Kazuko has a diagnosis of insulin resistance based on her elevated fasting insulin level >5. Although [name]'s blood glucose readings are still under good control, insulin resistance puts her at greater risk of metabolic syndrome and diabetes. She is not taking metformin currently and notes minimal carbohydrate cravings. She continues to work on diet and exercise to decrease risk of diabetes.  ASSESSMENT AND PLAN:  Vitamin D deficiency - Plan: Vitamin D, Ergocalciferol, (DRISDOL) 1.25 MG (50000 UT) CAPS capsule  Insulin resistance  Class 2 severe obesity with serious comorbidity and body mass index (BMI) of 38.0 to 38.9 in adult, unspecified obesity type ([place])  PLAN:  Vitamin D  Deficiency Kateryna was informed that low vitamin D levels contributes to fatigue and are associated with obesity, breast, and colon cancer. Anaston agrees to continue taking prescription Vit D @50 ,000 IU every week #4 and we will refill for 1 month. She will follow up for routine testing of vitamin D, at least 2-3 times per year. She was informed of the risk of over-replacement of vitamin D and agrees to not increase her dose unless she discusses this with [name] first. Jenette agrees to follow up with our clinic in 2 weeks.  Insulin Resistance Parveen will continue to work on weight loss, exercise, and decreasing simple carbohydrates in her diet to help decrease the risk of diabetes. We dicussed metformin including benefits and risks. She was informed that eating too many simple carbohydrates or too many calories at one sitting increases the likelihood of GI side effects. Graycie declined metformin for now and prescription was not written today. We will repeat labs at the end of May. Tariah agrees to follow up with our clinic in 2 weeks as directed to monitor her progress.  Obesity Dorice is currently in the action stage of change. As such, her goal is to continue with weight loss efforts She has agreed to follow the Category 2 plan Trinh has been instructed to work up to a goal of 150 minutes of combined cardio and strengthening exercise per week for weight loss and overall health benefits. We discussed the following Behavioral Modification Strategies today: increasing lean protein intake, work on meal planning, increase H20 intake, and easy cooking  plans and avoiding temptations   Jerilyn has agreed to follow up with our clinic in 2 weeks. She was informed of the importance of frequent follow up visits to maximize her success with intensive lifestyle modifications for her multiple health conditions.  ALLERGIES: No Known Allergies  MEDICATIONS: Current Outpatient Medications on File Prior to Visit  Medication Sig  Dispense Refill  . Ascorbic Acid (VITAMIN C) 100 MG tablet Take 100 mg by mouth daily.    [name] aspirin EC 81 MG tablet Take 81 mg by mouth daily.    . cholecalciferol (VITAMIN D3) 25 MCG (1000 UT) tablet Take 2,000 Units by mouth daily.    . hydrochlorothiazide (HYDRODIURIL) 25 MG tablet Take 25 mg by mouth daily.    [name] lisinopril (PRINIVIL,ZESTRIL) 10 MG tablet Take 10 mg by mouth daily.    . magnesium gluconate (MAGONATE) 500 MG tablet Take 500 mg by mouth 2 (two) times daily.    . Multiple Vitamins-Minerals (MULTIVITAMIN WITH MINERALS) tablet Take 1 tablet by mouth daily.    . naproxen sodium (ALEVE) 220 MG tablet Take 220 mg by mouth.    . vitamin B-12 (CYANOCOBALAMIN) 250 MCG tablet Take 250 mcg by mouth daily.     No current facility-administered medications on file prior to visit.     PAST MEDICAL HISTORY: Past Medical History:  Diagnosis Date  . Back pain   . Gallbladder problem   . Hypertension   . Swelling     PAST SURGICAL HISTORY: Past Surgical History:  Procedure Laterality Date  . ABDOMINAL HYSTERECTOMY    . BUNIONECTOMY  2016   R  . CHOLECYSTECTOMY      SOCIAL HISTORY: Social History   Tobacco Use  . Smoking status: Never Smoker  . Smokeless tobacco: Never Used  Substance Use Topics  . Alcohol use: Not Currently  . Drug use: Never    FAMILY HISTORY: Family History  Problem Relation Age of Onset  . Heart disease Mother   . Breast cancer [name]     ROS: Review of Systems  Constitutional: Positive for malaise/fatigue. Negative for weight loss.  Gastrointestinal: Negative for nausea and vomiting.  Musculoskeletal:       Negative muscle weakness    PHYSICAL EXAM: Pt in no acute distress  RECENT LABS AND TESTS: BMET No results found for: NA, K, CL, CO2, GLUCOSE, BUN, CREATININE, CALCIUM, GFRNONAA, GFRAA Lab Results  Component Value Date   HGBA1C 5.1 12/24/2018   Lab Results  Component Value Date   INSULIN 11.9 12/24/2018   CBC     Component Value Date/Time   WBC 4.6 12/24/2018 1456   RBC 5.02 12/24/2018 1456   HGB 14.2 12/24/2018 1456   HCT 42.5 12/24/2018 1456   PLT 271 12/24/2018 1456   MCV 85 12/24/2018 1456   MCH 28.3 12/24/2018 1456   MCHC 33.4 12/24/2018 1456   RDW 12.7 12/24/2018 1456   LYMPHSABS 1.5 12/24/2018 1456   EOSABS 0.0 12/24/2018 1456   BASOSABS 0.0 12/24/2018 1456   Iron/TIBC/Ferritin/ %Sat No results found for: IRON, TIBC, FERRITIN, IRONPCTSAT Lipid Panel  No results found for: CHOL, TRIG, HDL, CHOLHDL, VLDL, LDLCALC, LDLDIRECT Hepatic Function Panel  No results found for: PROT, ALBUMIN, AST, ALT, ALKPHOS, BILITOT, BILIDIR, IBILI    Component Value Date/Time   TSH 0.461 12/24/2018 1456      I, Trixie Dredge, am acting as transcriptionist for Ilene Qua, MD  I have reviewed the above documentation for accuracy and completeness, and I  agree with the above. - Ilene Qua, MD

## 2019-02-11 ENCOUNTER — Other Ambulatory Visit: Payer: Medicare Other

## 2019-02-22 ENCOUNTER — Other Ambulatory Visit: Payer: Self-pay

## 2019-02-22 ENCOUNTER — Ambulatory Visit (INDEPENDENT_AMBULATORY_CARE_PROVIDER_SITE_OTHER): Payer: Medicare Other | Admitting: Family Medicine

## 2019-02-22 ENCOUNTER — Encounter (INDEPENDENT_AMBULATORY_CARE_PROVIDER_SITE_OTHER): Payer: Self-pay | Admitting: Family Medicine

## 2019-02-22 DIAGNOSIS — E559 Vitamin D deficiency, unspecified: Secondary | ICD-10-CM | POA: Diagnosis not present

## 2019-02-22 DIAGNOSIS — I10 Essential (primary) hypertension: Secondary | ICD-10-CM

## 2019-02-22 DIAGNOSIS — Z6838 Body mass index (BMI) 38.0-38.9, adult: Secondary | ICD-10-CM | POA: Diagnosis not present

## 2019-02-24 NOTE — Progress Notes (Signed)
Office: [phone]  /  Fax: [phone] TeleHealth Visit:  Jennifer Tyler has verbally consented to this TeleHealth visit today. The patient is located at home, the provider is located at the [employer] [facility] office. The participants in this visit include the listed provider and patient. The visit was conducted today via webex.  HPI:   Chief Complaint: OBESITY Jennifer Tyler is here to discuss her progress with her obesity treatment plan. She is on the Category 2 plan and is following her eating plan approximately 80 % of the time. She states she is walking for 15 minutes 3 times per week. [name] is experiencing a good amount of stress secondary to COVID-19. She is eating 80% of what is on the plan. She is able to eat all of her breakfast and lunch, and most of dinner. She is occasionally stress eating. She is getting used to the meal plan.  We were unable to weigh the patient today for this TeleHealth visit. She feels as if she has lost 2 lbs since her last visit. She has lost 0 lbs since starting treatment with [name].  [name] is a 68 y.o. female with hypertension. Eyonna's blood pressure is controlled at home (135/80). She denies chest pain, chest pressure, or headaches. She is working on weight loss to help control her blood pressure with the goal of decreasing her risk of heart attack and stroke.   Vitamin D Deficiency Jennifer Tyler has a diagnosis of vitamin D deficiency. She is currently taking prescription Vit D. She notes fatigue and denies nausea, vomiting or muscle weakness.  ASSESSMENT AND PLAN:  Essential hypertension  Vitamin D deficiency  Class 2 severe obesity with serious comorbidity and body mass index (BMI) of 38.0 to 38.9 in adult, unspecified obesity type ([place])  PLAN:  Hypertension We discussed sodium restriction, working on healthy weight loss, and a regular exercise program as the means to achieve improved blood pressure control. [name] agreed with this plan and  agreed to follow up as directed. We will continue to monitor her blood pressure as well as her progress with the above lifestyle modifications. [name] agrees to continue her current medications and will watch for signs of hypotension as she continues her lifestyle modifications. [name] agrees to follow up with our clinic in 2 weeks.  Vitamin D Deficiency Jennifer Tyler was informed that low vitamin D levels contributes to fatigue and are associated with obesity, breast, and colon cancer. Jennifer Tyler agrees to continue taking prescription Vit D @50 ,000 IU every week, no refill needed. She will follow up for routine testing of vitamin D, at least 2-3 times per year. She was informed of the risk of over-replacement of vitamin D and agrees to not increase her dose unless she discusses this with [name] first. [name] agrees to follow up with our clinic in 2 weeks.  Obesity Jennifer Tyler is currently in the action stage of change. As such, her goal is to continue with weight loss efforts She has agreed to follow the Category 2 plan [name] has been instructed to work up to a goal of 150 minutes of combined cardio and strengthening exercise per week for weight loss and overall health benefits. We discussed the following Behavioral Modification Strategies today: increasing lean protein intake, increasing vegetables, work on meal planning and easy cooking plans, emotional eating strategies, ways to avoid boredom eating, avoiding temptations, better snacking choices, and planning for success   Jennifer Tyler has agreed to follow up with our clinic in 2 weeks.  She was informed of the importance of frequent follow up visits to maximize her success with intensive lifestyle modifications for her multiple health conditions.  ALLERGIES: No Known Allergies  MEDICATIONS: Current Outpatient Medications on File Prior to Visit  Medication Sig Dispense Refill  . Ascorbic Acid (VITAMIN C) 100 MG tablet Take 100 mg by mouth daily.    [name] aspirin EC 81 MG tablet Take 81  mg by mouth daily.    . cholecalciferol (VITAMIN D3) 25 MCG (1000 UT) tablet Take 2,000 Units by mouth daily.    . hydrochlorothiazide (HYDRODIURIL) 25 MG tablet Take 25 mg by mouth daily.    [name] lisinopril (PRINIVIL,ZESTRIL) 10 MG tablet Take 10 mg by mouth daily.    . magnesium gluconate (MAGONATE) 500 MG tablet Take 500 mg by mouth 2 (two) times daily.    . Multiple Vitamins-Minerals (MULTIVITAMIN WITH MINERALS) tablet Take 1 tablet by mouth daily.    . naproxen sodium (ALEVE) 220 MG tablet Take 220 mg by mouth.    . vitamin B-12 (CYANOCOBALAMIN) 250 MCG tablet Take 250 mcg by mouth daily.    . Vitamin D, Ergocalciferol, (DRISDOL) 1.25 MG (50000 UT) CAPS capsule Take 1 capsule (50,000 Units total) by mouth every 7 (seven) days. 4 capsule 0   No current facility-administered medications on file prior to visit.     PAST MEDICAL HISTORY: Past Medical History:  Diagnosis Date  . Back pain   . Gallbladder problem   . Hypertension   . Swelling     PAST SURGICAL HISTORY: Past Surgical History:  Procedure Laterality Date  . ABDOMINAL HYSTERECTOMY    . BUNIONECTOMY  2016   R  . CHOLECYSTECTOMY      SOCIAL HISTORY: Social History   Tobacco Use  . Smoking status: Never Smoker  . Smokeless tobacco: Never Used  Substance Use Topics  . Alcohol use: Not Currently  . Drug use: Never    FAMILY HISTORY: Family History  Problem Relation Age of Onset  . Heart disease Mother   . Breast cancer [name]     ROS: Review of Systems  Constitutional: Positive for malaise/fatigue and weight loss.  Cardiovascular: Negative for chest pain.       Negative chest pressure  Gastrointestinal: Negative for nausea and vomiting.  Musculoskeletal:       Negative muscle weakness  Neurological: Negative for headaches.    PHYSICAL EXAM: Pt in no acute distress  RECENT LABS AND TESTS: BMET No results found for: NA, K, CL, CO2, GLUCOSE, BUN, CREATININE, CALCIUM, GFRNONAA, GFRAA Lab Results   Component Value Date   HGBA1C 5.1 12/24/2018   Lab Results  Component Value Date   INSULIN 11.9 12/24/2018   CBC    Component Value Date/Time   WBC 4.6 12/24/2018 1456   RBC 5.02 12/24/2018 1456   HGB 14.2 12/24/2018 1456   HCT 42.5 12/24/2018 1456   PLT 271 12/24/2018 1456   MCV 85 12/24/2018 1456   MCH 28.3 12/24/2018 1456   MCHC 33.4 12/24/2018 1456   RDW 12.7 12/24/2018 1456   LYMPHSABS 1.5 12/24/2018 1456   EOSABS 0.0 12/24/2018 1456   BASOSABS 0.0 12/24/2018 1456   Iron/TIBC/Ferritin/ %Sat No results found for: IRON, TIBC, FERRITIN, IRONPCTSAT Lipid Panel  No results found for: CHOL, TRIG, HDL, CHOLHDL, VLDL, LDLCALC, LDLDIRECT Hepatic Function Panel  No results found for: PROT, ALBUMIN, AST, ALT, ALKPHOS, BILITOT, BILIDIR, IBILI    Component Value Date/Time   TSH 0.461 12/24/2018 1456  I, Trixie Dredge, am acting as transcriptionist for Ilene Qua, MD  I have reviewed the above documentation for accuracy and completeness, and I agree with the above. - Ilene Qua, MD

## 2019-03-08 ENCOUNTER — Other Ambulatory Visit: Payer: Self-pay

## 2019-03-08 ENCOUNTER — Ambulatory Visit (INDEPENDENT_AMBULATORY_CARE_PROVIDER_SITE_OTHER): Payer: Medicare Other | Admitting: Family Medicine

## 2019-03-08 ENCOUNTER — Encounter (INDEPENDENT_AMBULATORY_CARE_PROVIDER_SITE_OTHER): Payer: Self-pay | Admitting: Family Medicine

## 2019-03-08 DIAGNOSIS — Z6838 Body mass index (BMI) 38.0-38.9, adult: Secondary | ICD-10-CM

## 2019-03-08 DIAGNOSIS — E8881 Metabolic syndrome: Secondary | ICD-10-CM | POA: Diagnosis not present

## 2019-03-08 DIAGNOSIS — E559 Vitamin D deficiency, unspecified: Secondary | ICD-10-CM | POA: Diagnosis not present

## 2019-03-08 MED ORDER — VITAMIN D (ERGOCALCIFEROL) 1.25 MG (50000 UNIT) PO CAPS: 50000.0000 [IU] | ORAL_CAPSULE | ORAL | 0 refills | Status: DC

## 2019-03-08 NOTE — Progress Notes (Signed)
Office: [phone]  /  Fax: [phone] TeleHealth Visit:  Jennifer Tyler has verbally consented to this TeleHealth visit today. The patient is located at home, the provider is located at the [facility] office. The participants in this visit include the listed provider and patient. The visit was conducted today via audio.   HPI:   Chief Complaint: OBESITY Jennifer Tyler is here to discuss her progress with her obesity treatment plan. She is on the Category 2 plan and is following her eating plan approximately 75-80 % of the time. She states she is walking for 20 minutes 3 times per week. Jennifer Tyler is weighing in at 185 lbs today. She is doing some walking around 20 minutes 3 times per week. She is able to find all the food on the plan. She is doing quite a bit of fruit for snacks. She denies hunger and is occasionally not eating all of the food on the plan.  We were unable to weigh the patient today for this TeleHealth visit. She feels as if she has lost 3 lbs since her last visit. She has lost 0 lbs since starting treatment with [name].  Vitamin D Deficiency Jennifer Tyler has a diagnosis of vitamin D deficiency. She is currently taking prescription Vit D. She notes fatigue and denies nausea, vomiting or muscle weakness.  Insulin Resistance Jennifer Tyler has a diagnosis of insulin resistance based on her elevated fasting insulin level >5. Although Jennifer Tyler's blood glucose readings are still under good control, insulin resistance puts her at greater risk of metabolic syndrome and diabetes. She notes minimal carbohydrate cravings and she is not on medications. She continues to work on diet and exercise to decrease risk of diabetes.  ASSESSMENT AND PLAN:  Vitamin D deficiency - Plan: Vitamin D, Ergocalciferol, (DRISDOL) 1.25 MG (50000 UT) CAPS capsule  Insulin resistance  Class 2 severe obesity with serious comorbidity and body mass index (BMI) of 38.0 to 38.9 in adult, unspecified obesity type ([place])  PLAN:   Vitamin D Deficiency Jennifer Tyler was informed that low vitamin D levels contributes to fatigue and are associated with obesity, breast, and colon cancer. Jennifer Tyler agrees to continue taking prescription Vit D @50 ,000 IU every week #4 and we will refill for 1 month. She will follow up for routine testing of vitamin D, at least 2-3 times per year. She was informed of the risk of over-replacement of vitamin D and agrees to not increase her dose unless she discusses this with [name] first. Jennifer Tyler agrees to follow up with our clinic in 2 weeks.  Insulin Resistance Jennifer Tyler will continue to work on weight loss, exercise, and decreasing simple carbohydrates in her diet to help decrease the risk of diabetes. We dicussed metformin including benefits and risks. She was informed that eating too many simple carbohydrates or too many calories at one sitting increases the likelihood of GI side effects. Jennifer Tyler declined metformin for now and prescription was not written today. We will repeat labs in early June. Jennifer Tyler agrees to follow up with our clinic in 2 weeks as directed to monitor her progress.  Obesity Jennifer Tyler is currently in the action stage of change. As such, her goal is to continue with weight loss efforts She has agreed to follow the Category 2 plan Jennifer Tyler has been instructed to work up to a goal of 150 minutes of combined cardio and strengthening exercise per week for weight loss and overall health benefits. We discussed the following Behavioral Modification Strategies today: increasing lean protein intake,  increasing vegetables and work on meal planning and easy cooking plans, keeping healthy foods in the home, and planning for success   Jennifer Tyler has agreed to follow up with our clinic in 2 weeks. She was informed of the importance of frequent follow up visits to maximize her success with intensive lifestyle modifications for her multiple health conditions.  ALLERGIES: No Known Allergies  MEDICATIONS: Current Outpatient Medications  on File Prior to Visit  Medication Sig Dispense Refill  . Ascorbic Acid (VITAMIN C) 100 MG tablet Take 100 mg by mouth daily.    [name] aspirin EC 81 MG tablet Take 81 mg by mouth daily.    . cholecalciferol (VITAMIN D3) 25 MCG (1000 UT) tablet Take 2,000 Units by mouth daily.    . hydrochlorothiazide (HYDRODIURIL) 25 MG tablet Take 25 mg by mouth daily.    [name] lisinopril (PRINIVIL,ZESTRIL) 10 MG tablet Take 10 mg by mouth daily.    . magnesium gluconate (MAGONATE) 500 MG tablet Take 500 mg by mouth 2 (two) times daily.    . Multiple Vitamins-Minerals (MULTIVITAMIN WITH MINERALS) tablet Take 1 tablet by mouth daily.    . naproxen sodium (ALEVE) 220 MG tablet Take 220 mg by mouth.    . vitamin B-12 (CYANOCOBALAMIN) 250 MCG tablet Take 250 mcg by mouth daily.     No current facility-administered medications on file prior to visit.     PAST MEDICAL HISTORY: Past Medical History:  Diagnosis Date  . Back pain   . Gallbladder problem   . Hypertension   . Swelling     PAST SURGICAL HISTORY: Past Surgical History:  Procedure Laterality Date  . ABDOMINAL HYSTERECTOMY    . BUNIONECTOMY  2016   R  . CHOLECYSTECTOMY      SOCIAL HISTORY: Social History   Tobacco Use  . Smoking status: Never Smoker  . Smokeless tobacco: Never Used  Substance Use Topics  . Alcohol use: Not Currently  . Drug use: Never    FAMILY HISTORY: Family History  Problem Relation Age of Onset  . Heart disease Mother   . Breast cancer [name]     ROS: Review of Systems  Constitutional: Positive for malaise/fatigue and weight loss.  Gastrointestinal: Negative for nausea and vomiting.  Musculoskeletal:       Negative muscle weakness    PHYSICAL EXAM: Pt in no acute distress  RECENT LABS AND TESTS: BMET No results found for: NA, K, CL, CO2, GLUCOSE, BUN, CREATININE, CALCIUM, GFRNONAA, GFRAA Lab Results  Component Value Date   HGBA1C 5.1 12/24/2018   Lab Results  Component Value Date   INSULIN 11.9  12/24/2018   CBC    Component Value Date/Time   WBC 4.6 12/24/2018 1456   RBC 5.02 12/24/2018 1456   HGB 14.2 12/24/2018 1456   HCT 42.5 12/24/2018 1456   PLT 271 12/24/2018 1456   MCV 85 12/24/2018 1456   MCH 28.3 12/24/2018 1456   MCHC 33.4 12/24/2018 1456   RDW 12.7 12/24/2018 1456   LYMPHSABS 1.5 12/24/2018 1456   EOSABS 0.0 12/24/2018 1456   BASOSABS 0.0 12/24/2018 1456   Iron/TIBC/Ferritin/ %Sat No results found for: IRON, TIBC, FERRITIN, IRONPCTSAT Lipid Panel  No results found for: CHOL, TRIG, HDL, CHOLHDL, VLDL, LDLCALC, LDLDIRECT Hepatic Function Panel  No results found for: PROT, ALBUMIN, AST, ALT, ALKPHOS, BILITOT, BILIDIR, IBILI    Component Value Date/Time   TSH 0.461 12/24/2018 1456      I, Trixie Dredge, am acting as transcriptionist for  Ilene Qua, MD  I have reviewed the above documentation for accuracy and completeness, and I agree with the above. - Ilene Qua, MD

## 2019-03-22 ENCOUNTER — Ambulatory Visit (INDEPENDENT_AMBULATORY_CARE_PROVIDER_SITE_OTHER): Payer: Medicare Other | Admitting: Family Medicine

## 2019-03-22 ENCOUNTER — Encounter (INDEPENDENT_AMBULATORY_CARE_PROVIDER_SITE_OTHER): Payer: Self-pay | Admitting: Family Medicine

## 2019-03-22 ENCOUNTER — Other Ambulatory Visit: Payer: Self-pay

## 2019-03-22 DIAGNOSIS — E8881 Metabolic syndrome: Secondary | ICD-10-CM

## 2019-03-22 DIAGNOSIS — Z6838 Body mass index (BMI) 38.0-38.9, adult: Secondary | ICD-10-CM

## 2019-03-22 DIAGNOSIS — E559 Vitamin D deficiency, unspecified: Secondary | ICD-10-CM

## 2019-03-22 MED ORDER — VITAMIN D (ERGOCALCIFEROL) 1.25 MG (50000 UNIT) PO CAPS: 50000.0000 [IU] | ORAL_CAPSULE | ORAL | 0 refills | Status: DC

## 2019-03-22 NOTE — Progress Notes (Signed)
Office: [phone]  /  Fax: [phone] TeleHealth Visit:  Jennifer Tyler has verbally consented to this TeleHealth visit today. The patient is located at home, the provider is located at the [facility] office. The participants in this visit include the listed provider and patient. The visit was conducted today via webex.  HPI:   Chief Complaint: OBESITY Jennifer Tyler is here to discuss her progress with her obesity treatment plan. She is on the Category 2 plan and is following her eating plan approximately 70 % of the time. She states she is walking and riding the stationary bike. Jennifer Tyler weighed in at 185 lbs today. She voices that she celebrated Mother's Day and found it hard to get back on track. She tends to follow the plan 3-4 days then will deviate off the plan.  We were unable to weigh the patient today for this TeleHealth visit. She feels as if she has maintained her weight since her last visit. She has lost 3 lbs since starting treatment with [name].  Vitamin D Deficiency Jennifer Tyler has a diagnosis of vitamin D deficiency. She is currently taking prescription Vit D. She notes fatigue and denies nausea, vomiting or muscle weakness.  Insulin Resistance Jennifer Tyler has a diagnosis of insulin resistance based on her elevated fasting insulin level >5. Although Jennifer Tyler's blood glucose readings are still under good control, insulin resistance puts her at greater risk of metabolic syndrome and diabetes. She is not taking metformin currently and notes carbohydrate cravings for starches at meals. She continues to work on diet and exercise to decrease risk of diabetes.  ASSESSMENT AND PLAN:  Vitamin D deficiency - Plan: Vitamin D, Ergocalciferol, (DRISDOL) 1.25 MG (50000 UT) CAPS capsule  Insulin resistance  Class 2 severe obesity with serious comorbidity and body mass index (BMI) of 38.0 to 38.9 in adult, unspecified obesity type ([place])  PLAN:  Vitamin D Deficiency Jennifer Tyler was informed that low vitamin D  levels contributes to fatigue and are associated with obesity, breast, and colon cancer. Jennifer Tyler agrees to continue taking prescription Vit D @50 ,000 IU every week #4 and we will refill for 1 month. She will follow up for routine testing of vitamin D, at least 2-3 times per year. She was informed of the risk of over-replacement of vitamin D and agrees to not increase her dose unless she discusses this with [name] first. Jennifer Tyler agrees to follow up with our clinic in 2 weeks.  Insulin Resistance Jennifer Tyler will continue to work on weight loss, exercise, and decreasing simple carbohydrates in her diet to help decrease the risk of diabetes. We dicussed metformin including benefits and risks. She was informed that eating too many simple carbohydrates or too many calories at one sitting increases the likelihood of GI side effects. Jennifer Tyler declined metformin for now and prescription was not written today. We will repeat labs at her first in person appointment. Jennifer Tyler agrees to follow up with our clinic in 2 weeks as directed to monitor her progress.  Obesity Jennifer Tyler is currently in the action stage of change. As such, her goal is to continue with weight loss efforts She has agreed to follow the Category 2 plan Jennifer Tyler has been instructed to work up to a goal of 150 minutes of combined cardio and strengthening exercise per week for weight loss and overall health benefits. We discussed the following Behavioral Modification Strategies today: increasing lean protein intake, increasing vegetables, work on meal planning and easy cooking plans and emotional eating strategies, keeping healthy  foods in the home, better snacking choices, and planning for success   Jennifer Tyler has agreed to follow up with our clinic in 2 weeks. She was informed of the importance of frequent follow up visits to maximize her success with intensive lifestyle modifications for her multiple health conditions.  ALLERGIES: No Known Allergies  MEDICATIONS: Current  Outpatient Medications on File Prior to Visit  Medication Sig Dispense Refill  . Ascorbic Acid (VITAMIN C) 100 MG tablet Take 100 mg by mouth daily.    [name] aspirin EC 81 MG tablet Take 81 mg by mouth daily.    . cholecalciferol (VITAMIN D3) 25 MCG (1000 UT) tablet Take 2,000 Units by mouth daily.    . hydrochlorothiazide (HYDRODIURIL) 25 MG tablet Take 25 mg by mouth daily.    [name] lisinopril (PRINIVIL,ZESTRIL) 10 MG tablet Take 10 mg by mouth daily.    . magnesium gluconate (MAGONATE) 500 MG tablet Take 500 mg by mouth 2 (two) times daily.    . Multiple Vitamins-Minerals (MULTIVITAMIN WITH MINERALS) tablet Take 1 tablet by mouth daily.    . naproxen sodium (ALEVE) 220 MG tablet Take 220 mg by mouth.    . vitamin B-12 (CYANOCOBALAMIN) 250 MCG tablet Take 250 mcg by mouth daily.     No current facility-administered medications on file prior to visit.     PAST MEDICAL HISTORY: Past Medical History:  Diagnosis Date  . Back pain   . Gallbladder problem   . Hypertension   . Swelling     PAST SURGICAL HISTORY: Past Surgical History:  Procedure Laterality Date  . ABDOMINAL HYSTERECTOMY    . BUNIONECTOMY  2016   R  . CHOLECYSTECTOMY      SOCIAL HISTORY: Social History   Tobacco Use  . Smoking status: Never Smoker  . Smokeless tobacco: Never Used  Substance Use Topics  . Alcohol use: Not Currently  . Drug use: Never    FAMILY HISTORY: Family History  Problem Relation Age of Onset  . Heart disease Mother   . Breast cancer [name]     ROS: Review of Systems  Constitutional: Positive for malaise/fatigue. Negative for weight loss.  Gastrointestinal: Negative for nausea and vomiting.  Musculoskeletal:       Negative muscle weakness    PHYSICAL EXAM: Pt in no acute distress  RECENT LABS AND TESTS: BMET No results found for: NA, K, CL, CO2, GLUCOSE, BUN, CREATININE, CALCIUM, GFRNONAA, GFRAA Lab Results  Component Value Date   HGBA1C 5.1 12/24/2018   Lab Results   Component Value Date   INSULIN 11.9 12/24/2018   CBC    Component Value Date/Time   WBC 4.6 12/24/2018 1456   RBC 5.02 12/24/2018 1456   HGB 14.2 12/24/2018 1456   HCT 42.5 12/24/2018 1456   PLT 271 12/24/2018 1456   MCV 85 12/24/2018 1456   MCH 28.3 12/24/2018 1456   MCHC 33.4 12/24/2018 1456   RDW 12.7 12/24/2018 1456   LYMPHSABS 1.5 12/24/2018 1456   EOSABS 0.0 12/24/2018 1456   BASOSABS 0.0 12/24/2018 1456   Iron/TIBC/Ferritin/ %Sat No results found for: IRON, TIBC, FERRITIN, IRONPCTSAT Lipid Panel  No results found for: CHOL, TRIG, HDL, CHOLHDL, VLDL, LDLCALC, LDLDIRECT Hepatic Function Panel  No results found for: PROT, ALBUMIN, AST, ALT, ALKPHOS, BILITOT, BILIDIR, IBILI    Component Value Date/Time   TSH 0.461 12/24/2018 1456      I, Trixie Dredge, am acting as transcriptionist for Ilene Qua, MD   I have reviewed the  above documentation for accuracy and completeness, and I agree with the above. - Ilene Qua, MD

## 2019-04-05 ENCOUNTER — Ambulatory Visit (INDEPENDENT_AMBULATORY_CARE_PROVIDER_SITE_OTHER): Payer: Medicare Other | Admitting: Family Medicine

## 2019-04-07 ENCOUNTER — Encounter (INDEPENDENT_AMBULATORY_CARE_PROVIDER_SITE_OTHER): Payer: Self-pay | Admitting: Family Medicine

## 2019-04-07 ENCOUNTER — Other Ambulatory Visit: Payer: Self-pay

## 2019-04-07 ENCOUNTER — Ambulatory Visit (INDEPENDENT_AMBULATORY_CARE_PROVIDER_SITE_OTHER): Payer: Medicare Other | Admitting: Family Medicine

## 2019-04-07 DIAGNOSIS — E8881 Metabolic syndrome: Secondary | ICD-10-CM | POA: Diagnosis not present

## 2019-04-07 DIAGNOSIS — Z6838 Body mass index (BMI) 38.0-38.9, adult: Secondary | ICD-10-CM

## 2019-04-07 DIAGNOSIS — I10 Essential (primary) hypertension: Secondary | ICD-10-CM | POA: Diagnosis not present

## 2019-04-07 NOTE — Progress Notes (Signed)
Office: [phone]  /  Fax: [phone] TeleHealth Visit:  Jennifer Tyler has verbally consented to this TeleHealth visit today. The patient is located at home, the provider is located at the [facility] office. The participants in this visit include the listed provider and patient. The visit was conducted today via Webex.  HPI:   Chief Complaint: OBESITY Jennifer Tyler is here to discuss her progress with her obesity treatment plan. She is on the Category 2 plan and is following her eating plan approximately 70% of the time. She states she is walking/biking 90 minutes 3-4 times per week. Jennifer Tyler states her weight is up to 187 lbs. She was 185 lbs at her last visit. She has been off of the plan somewhat since Mother's Day. She is on the plan for breakfast and lunch but is often off of the plan at dinner. She reports eating carbs with dinner and snacking on crackers, peanut butter crackers, and raisins during the day. She is not eating all of her protein. We were unable to weigh the patient today for this TeleHealth visit. She states her weight today is 187 lbs. She has lost 0 lbs since starting treatment with [name].  Hypertension Jennifer Tyler is a 68 y.o. female with hypertension, well controlled on HCTZ and lisinopril. Jennifer Tyler denies chest pain or shortness of breath on exertion. She is working weight loss to help control her blood pressure with the goal of decreasing her risk of heart attack and stroke. Jennifer Tyler's blood pressure today is 128/80. BP Readings from Last 3 Encounters:  01/07/19 113/75  12/24/18 122/74    Insulin Resistance Jennifer Tyler has a diagnosis of insulin resistance based on her elevated fasting insulin level >5. Although Jennifer Tyler's blood glucose readings are still under good control, insulin resistance puts her at greater risk of metabolic syndrome and diabetes. She is not taking metformin currently and continues to work on diet and exercise to decrease risk of diabetes. Lab Results   Component Value Date   HGBA1C 5.1 12/24/2018    ASSESSMENT AND PLAN:  Essential hypertension  Insulin resistance  Class 2 severe obesity with serious comorbidity and body mass index (BMI) of 38.0 to 38.9 in adult, unspecified obesity type (Jennifer Tyler)  PLAN:  Hypertension We discussed sodium restriction, working on healthy weight loss, and a regular exercise program as the means to achieve improved blood pressure control. Jennifer Tyler agreed with this plan and agreed to follow up as directed. We will continue to monitor her blood pressure as well as her progress with the above lifestyle modifications. She will continue her medications as prescribed and will watch for signs of hypotension as she continues her lifestyle modifications.  Insulin Resistance Jennifer Tyler will continue to work on weight loss, exercise, and decreasing simple carbohydrates in her diet to help decrease the risk of diabetes. We dicussed metformin including benefits and risks. She was informed that eating too many simple carbohydrates or too many calories at one sitting increases the likelihood of GI side effects. Jennifer Tyler will continue her meal plan and follow-up with [name] as directed to monitor her progress.  I spent > than 50% of the 15 minute visit on counseling as documented in the note.  Obesity Jennifer Tyler is currently in the action stage of change. As such, her goal is to continue with weight loss efforts. She has agreed to follow the Category 2 plan. She is to plan snacking during the day to include two 100 calorie snacks. Jennifer Tyler was  sent a MyChart message addressing protein intake. Jennifer Tyler has been instructed to continue her current exercise regimen for weight loss and overall health benefits. We discussed the following Behavioral Modification Strategies today: decreasing simple carbohydrates, better snacking choices, and planning for success.   Jennifer Tyler has agreed to follow-up with our clinic in 2 weeks. She was informed of the importance of  frequent follow-up visits to maximize her success with intensive lifestyle modifications for her multiple health conditions.  ALLERGIES: No Known Allergies  MEDICATIONS: Current Outpatient Medications on File Prior to Visit  Medication Sig Dispense Refill  . Ascorbic Acid (VITAMIN C) 100 MG tablet Take 100 mg by mouth daily.    [name] aspirin EC 81 MG tablet Take 81 mg by mouth daily.    . cholecalciferol (VITAMIN D3) 25 MCG (1000 UT) tablet Take 2,000 Units by mouth daily.    . hydrochlorothiazide (HYDRODIURIL) 25 MG tablet Take 25 mg by mouth daily.    [name] lisinopril (PRINIVIL,ZESTRIL) 10 MG tablet Take 10 mg by mouth daily.    . magnesium gluconate (MAGONATE) 500 MG tablet Take 500 mg by mouth 2 (two) times daily.    . Multiple Vitamins-Minerals (MULTIVITAMIN WITH MINERALS) tablet Take 1 tablet by mouth daily.    . naproxen sodium (ALEVE) 220 MG tablet Take 220 mg by mouth.    . vitamin B-12 (CYANOCOBALAMIN) 250 MCG tablet Take 250 mcg by mouth daily.    . Vitamin D, Ergocalciferol, (DRISDOL) 1.25 MG (50000 UT) CAPS capsule Take 1 capsule (50,000 Units total) by mouth every 7 (seven) days. 4 capsule 0   No current facility-administered medications on file prior to visit.     PAST MEDICAL HISTORY: Past Medical History:  Diagnosis Date  . Back pain   . Gallbladder problem   . Hypertension   . Swelling     PAST SURGICAL HISTORY: Past Surgical History:  Procedure Laterality Date  . ABDOMINAL HYSTERECTOMY    . BUNIONECTOMY  2016   R  . CHOLECYSTECTOMY      SOCIAL HISTORY: Social History   Tobacco Use  . Smoking status: Never Smoker  . Smokeless tobacco: Never Used  Substance Use Topics  . Alcohol use: Not Currently  . Drug use: Never    FAMILY HISTORY: Family History  Problem Relation Age of Onset  . Heart disease Mother   . Breast cancer [name]    ROS: Review of Systems  Respiratory: Negative for shortness of breath.   Cardiovascular: Negative for chest pain.    PHYSICAL EXAM: Pt in no acute distress  RECENT LABS AND TESTS: BMET No results found for: NA, K, CL, CO2, GLUCOSE, BUN, CREATININE, CALCIUM, GFRNONAA, GFRAA Lab Results  Component Value Date   HGBA1C 5.1 12/24/2018   Lab Results  Component Value Date   INSULIN 11.9 12/24/2018   CBC    Component Value Date/Time   WBC 4.6 12/24/2018 1456   RBC 5.02 12/24/2018 1456   HGB 14.2 12/24/2018 1456   HCT 42.5 12/24/2018 1456   PLT 271 12/24/2018 1456   MCV 85 12/24/2018 1456   MCH 28.3 12/24/2018 1456   MCHC 33.4 12/24/2018 1456   RDW 12.7 12/24/2018 1456   LYMPHSABS 1.5 12/24/2018 1456   EOSABS 0.0 12/24/2018 1456   BASOSABS 0.0 12/24/2018 1456   Iron/TIBC/Ferritin/ %Sat No results found for: IRON, TIBC, FERRITIN, IRONPCTSAT Lipid Panel  No results found for: CHOL, TRIG, HDL, CHOLHDL, VLDL, LDLCALC, LDLDIRECT Hepatic Function Panel  No results found for: PROT,  ALBUMIN, AST, ALT, ALKPHOS, BILITOT, BILIDIR, IBILI    Component Value Date/Time   TSH 0.461 12/24/2018 1456   Results for CRESCENTIA, BOUTWELL (MRN [id number]) as of 04/07/2019 13:58  Ref. Range 12/24/2018 14:56  Vitamin D, 25-Hydroxy Latest Ref Range: 30.0 - 100.0 ng/mL 22.5 (L)    I, Michaelene Song, am acting as Location manager for [employer], FNP-C.  I have reviewed the above documentation for accuracy and completeness, and I agree with the above.  - Makinzee Durley, FNP-C.

## 2019-04-08 ENCOUNTER — Encounter (INDEPENDENT_AMBULATORY_CARE_PROVIDER_SITE_OTHER): Payer: Self-pay | Admitting: Family Medicine

## 2019-04-08 DIAGNOSIS — E88819 Insulin resistance, unspecified: Secondary | ICD-10-CM | POA: Insufficient documentation

## 2019-04-08 DIAGNOSIS — E8881 Metabolic syndrome: Secondary | ICD-10-CM | POA: Insufficient documentation

## 2019-04-08 DIAGNOSIS — E66811 Obesity, class 1: Secondary | ICD-10-CM | POA: Insufficient documentation

## 2019-04-08 DIAGNOSIS — Z6833 Body mass index (BMI) 33.0-33.9, adult: Secondary | ICD-10-CM | POA: Insufficient documentation

## 2019-04-08 DIAGNOSIS — I10 Essential (primary) hypertension: Secondary | ICD-10-CM | POA: Insufficient documentation

## 2019-04-08 DIAGNOSIS — E669 Obesity, unspecified: Secondary | ICD-10-CM | POA: Insufficient documentation

## 2019-04-20 ENCOUNTER — Telehealth (INDEPENDENT_AMBULATORY_CARE_PROVIDER_SITE_OTHER): Payer: Medicare Other | Admitting: Physician Assistant

## 2019-04-20 ENCOUNTER — Encounter (INDEPENDENT_AMBULATORY_CARE_PROVIDER_SITE_OTHER): Payer: Self-pay | Admitting: Physician Assistant

## 2019-04-20 ENCOUNTER — Other Ambulatory Visit: Payer: Self-pay

## 2019-04-20 ENCOUNTER — Ambulatory Visit
Admission: RE | Admit: 2019-04-20 | Discharge: 2019-04-20 | Disposition: A | Discharge status: Home / Self Care | Payer: Medicare Other | Source: Ambulatory Visit | Attending: Family Medicine | Admitting: Family Medicine

## 2019-04-20 DIAGNOSIS — Z6838 Body mass index (BMI) 38.0-38.9, adult: Secondary | ICD-10-CM

## 2019-04-20 DIAGNOSIS — E559 Vitamin D deficiency, unspecified: Secondary | ICD-10-CM

## 2019-04-20 DIAGNOSIS — E8881 Metabolic syndrome: Secondary | ICD-10-CM

## 2019-04-20 DIAGNOSIS — E2839 Other primary ovarian failure: Secondary | ICD-10-CM

## 2019-04-21 NOTE — Progress Notes (Signed)
Office: [phone]  /  Fax: [phone] TeleHealth Visit:  Jennifer Tyler has verbally consented to this TeleHealth visit today. The patient is located at home, the provider is located at the [facility] office. The participants in this visit include the listed provider and patient. The visit was conducted today via Webex.  HPI:   Chief Complaint: OBESITY Jennifer Tyler is here to discuss her progress with her obesity treatment plan. She is on the Category 2 plan. She states she is exercising on a treadmill bike 15 minutes 6 times per week. [name] reports her most recent weight to be 188 lbs. She is having a difficult time getting all of the protein in, especially at dinner. We were unable to weigh the patient today for this TeleHealth visit. She feels as if she has gained 1 lb since her last visit. She has lost 0 lbs since starting treatment with [name].  Vitamin D deficiency Jennifer Tyler has a diagnosis of Vitamin D deficiency. She is currently taking prescription Vit D and denies nausea, vomiting or muscle weakness.  Insulin Resistance Jennifer Tyler has a diagnosis of insulin resistance based on her elevated fasting insulin level >5. Although Jennifer Tyler's blood glucose readings are still under good control, insulin resistance puts her at greater risk of metabolic syndrome and diabetes. She is on no medications currently and continues to work on diet and exercise to decrease risk of diabetes. No polyphagia.  ASSESSMENT AND PLAN:  No diagnosis found.  PLAN:  Vitamin D Deficiency Jennifer Tyler was informed that low Vitamin D levels contributes to fatigue and are associated with obesity, breast, and colon cancer. She agrees to continue to take prescription Vit D @ 50,000 IU every week #4 with 0 refills and will follow-up for routine testing of Vitamin D, at least 2-3 times per year. She was informed of the risk of over-replacement of Vitamin D and agrees to not increase her dose unless she discusses this with [name] first.  Jennifer Tyler agrees to follow-up with our clinic in 2 weeks.  Insulin Resistance Jennifer Tyler will continue to work on weight loss, exercise, and decreasing simple carbohydrates in her diet to help decrease the risk of diabetes. We dicussed metformin including benefits and risks. She was informed that eating too many simple carbohydrates or too many calories at one sitting increases the likelihood of GI side effects. Jennifer Tyler will continue with weight loss and follow-up with [name] as directed to monitor her progress.  Obesity Jennifer Tyler is currently in the action stage of change. As such, her goal is to continue with weight loss efforts.  She has agreed to follow the Category 2 plan. Jennifer Tyler has been instructed to work up to a goal of 150 minutes of combined cardio and strengthening exercise per week for weight loss and overall health benefits. We discussed the following Behavioral Modification Strategies today: increasing lean protein intake, work on meal planning and easy cooking plans.  Jennifer Tyler has agreed to follow-up with our clinic in 2 weeks. She was informed of the importance of frequent follow-up visits to maximize her success with intensive lifestyle modifications for her multiple health conditions.  ALLERGIES: No Known Allergies  MEDICATIONS: Current Outpatient Medications on File Prior to Visit  Medication Sig Dispense Refill   Ascorbic Acid (VITAMIN C) 100 MG tablet Take 100 mg by mouth daily.     aspirin EC 81 MG tablet Take 81 mg by mouth daily.     cholecalciferol (VITAMIN D3) 25 MCG (1000 UT) tablet Take 2,000 Units  by mouth daily.     hydrochlorothiazide (HYDRODIURIL) 25 MG tablet Take 25 mg by mouth daily.     lisinopril (PRINIVIL,ZESTRIL) 10 MG tablet Take 10 mg by mouth daily.     magnesium gluconate (MAGONATE) 500 MG tablet Take 500 mg by mouth 2 (two) times daily.     Multiple Vitamins-Minerals (MULTIVITAMIN WITH MINERALS) tablet Take 1 tablet by mouth daily.     naproxen sodium (ALEVE) 220 MG  tablet Take 220 mg by mouth.     vitamin B-12 (CYANOCOBALAMIN) 250 MCG tablet Take 250 mcg by mouth daily.     Vitamin D, Ergocalciferol, (DRISDOL) 1.25 MG (50000 UT) CAPS capsule Take 1 capsule (50,000 Units total) by mouth every 7 (seven) days. 4 capsule 0   No current facility-administered medications on file prior to visit.     PAST MEDICAL HISTORY: Past Medical History:  Diagnosis Date   Back pain    Gallbladder problem    Hypertension    Swelling     PAST SURGICAL HISTORY: Past Surgical History:  Procedure Laterality Date   ABDOMINAL HYSTERECTOMY     BUNIONECTOMY  2016   R   CHOLECYSTECTOMY      SOCIAL HISTORY: Social History   Tobacco Use   Smoking status: Never Smoker   Smokeless tobacco: Never Used  Substance Use Topics   Alcohol use: Not Currently   Drug use: Never    FAMILY HISTORY: Family History  Problem Relation Age of Onset   Heart disease Mother    Breast cancer [name]    ROS: Review of Systems  Gastrointestinal: Negative for nausea and vomiting.  Musculoskeletal:       Negative for muscle weakness.  Endo/Heme/Allergies:       Negative for polyphagia.   PHYSICAL EXAM: Pt in no acute distress  RECENT LABS AND TESTS: BMET No results found for: NA, K, CL, CO2, GLUCOSE, BUN, CREATININE, CALCIUM, GFRNONAA, GFRAA Lab Results  Component Value Date   HGBA1C 5.1 12/24/2018   Lab Results  Component Value Date   INSULIN 11.9 12/24/2018   CBC    Component Value Date/Time   WBC 4.6 12/24/2018 1456   RBC 5.02 12/24/2018 1456   HGB 14.2 12/24/2018 1456   HCT 42.5 12/24/2018 1456   PLT 271 12/24/2018 1456   MCV 85 12/24/2018 1456   MCH 28.3 12/24/2018 1456   MCHC 33.4 12/24/2018 1456   RDW 12.7 12/24/2018 1456   LYMPHSABS 1.5 12/24/2018 1456   EOSABS 0.0 12/24/2018 1456   BASOSABS 0.0 12/24/2018 1456   Iron/TIBC/Ferritin/ %Sat No results found for: IRON, TIBC, FERRITIN, IRONPCTSAT Lipid Panel  No results found for:  CHOL, TRIG, HDL, CHOLHDL, VLDL, LDLCALC, LDLDIRECT Hepatic Function Panel  No results found for: PROT, ALBUMIN, AST, ALT, ALKPHOS, BILITOT, BILIDIR, IBILI    Component Value Date/Time   TSH 0.461 12/24/2018 1456   Results for ANNALICIA, RENFREW (MRN [id number]) as of 04/21/2019 16:05  Ref. Range 12/24/2018 14:56  Vitamin D, 25-Hydroxy Latest Ref Range: 30.0 - 100.0 ng/mL 22.5 (L)    I, Michaelene Song, am acting as Location manager for [employer], PA-C I, [name], PA-C have reviewed above note and agree with its content

## 2019-04-22 MED ORDER — VITAMIN D (ERGOCALCIFEROL) 1.25 MG (50000 UNIT) PO CAPS: 50000.0000 [IU] | ORAL_CAPSULE | ORAL | 0 refills | Status: DC

## 2019-04-28 ENCOUNTER — Other Ambulatory Visit: Payer: Self-pay | Admitting: *Deleted

## 2019-04-28 DIAGNOSIS — Z20822 Contact with and (suspected) exposure to covid-19: Secondary | ICD-10-CM

## 2019-05-03 LAB — NOVEL CORONAVIRUS, NAA: SARS-CoV-2, NAA: NOT DETECTED

## 2019-05-04 ENCOUNTER — Other Ambulatory Visit: Payer: Self-pay

## 2019-05-04 ENCOUNTER — Encounter (INDEPENDENT_AMBULATORY_CARE_PROVIDER_SITE_OTHER): Payer: Self-pay | Admitting: Physician Assistant

## 2019-05-04 ENCOUNTER — Telehealth (INDEPENDENT_AMBULATORY_CARE_PROVIDER_SITE_OTHER): Payer: Medicare Other | Admitting: Physician Assistant

## 2019-05-04 DIAGNOSIS — Z6838 Body mass index (BMI) 38.0-38.9, adult: Secondary | ICD-10-CM | POA: Diagnosis not present

## 2019-05-04 DIAGNOSIS — E559 Vitamin D deficiency, unspecified: Secondary | ICD-10-CM | POA: Diagnosis not present

## 2019-05-05 ENCOUNTER — Encounter (INDEPENDENT_AMBULATORY_CARE_PROVIDER_SITE_OTHER): Payer: Self-pay | Admitting: Physician Assistant

## 2019-05-05 NOTE — Progress Notes (Signed)
Office: [phone]  /  Fax: [phone] TeleHealth Visit:  Jennifer Tyler has verbally consented to this TeleHealth visit today. The patient is located at home, the provider is located at the [facility] office. The participants in this visit include the listed provider and patient. The visit was conducted today via Webex.  HPI:   Chief Complaint: OBESITY Jennifer Tyler is here to discuss her progress with her obesity treatment plan. She is on the Category 2 plan and is following her eating plan approximately 80% of the time. She states she is walking 20-30 minutes 3 times per week. Jennifer Tyler reports her weight to be 187 lbs. She continues to have trouble getting in all of the protein and does not want to eat that amount of meat. We were unable to weigh the patient today for this TeleHealth visit. She feels as if she has maintained her weight since her last visit. She has lost 0 lbs since starting treatment with [name].  Vitamin D deficiency Jennifer Tyler has a diagnosis of Vitamin D deficiency. She is currently taking weekly prescription Vit D and denies nausea, vomiting or muscle weakness.  ASSESSMENT AND PLAN:  Vitamin D deficiency  Class 2 severe obesity with serious comorbidity and body mass index (BMI) of 38.0 to 38.9 in adult, unspecified obesity type ([place])  PLAN:  Vitamin D Deficiency Jennifer Tyler was informed that low Vitamin D levels contributes to fatigue and are associated with obesity, breast, and colon cancer. She agrees to continue taking weekly prescription Vit D and will follow-up for routine testing of Vitamin D, at least 2-3 times per year. She was informed of the risk of over-replacement of Vitamin D and agrees to not increase her dose unless she discusses this with [name] first. Jennifer Tyler agrees to follow-up with our clinic in 2 weeks.  Obesity Jennifer Tyler is currently in the action stage of change. As such, her goal is to continue with weight loss efforts. She has agreed to follow the Pescatarian  plan. Jennifer Tyler has been instructed to work up to a goal of 150 minutes of combined cardio and strengthening exercise per week for weight loss and overall health benefits. We discussed the following Behavioral Modification Strategies today: work on meal planning, easy cooking plans, and keeping healthy foods in the home.  Jennifer Tyler has agreed to follow-up with our clinic in 2 weeks. She was informed of the importance of frequent follow-up visits to maximize her success with intensive lifestyle modifications for her multiple health conditions.  ALLERGIES: No Known Allergies  MEDICATIONS: Current Outpatient Medications on File Prior to Visit  Medication Sig Dispense Refill   Ascorbic Acid (VITAMIN C) 100 MG tablet Take 100 mg by mouth daily.     aspirin EC 81 MG tablet Take 81 mg by mouth daily.     cholecalciferol (VITAMIN D3) 25 MCG (1000 UT) tablet Take 2,000 Units by mouth daily.     hydrochlorothiazide (HYDRODIURIL) 25 MG tablet Take 25 mg by mouth daily.     lisinopril (PRINIVIL,ZESTRIL) 10 MG tablet Take 10 mg by mouth daily.     magnesium gluconate (MAGONATE) 500 MG tablet Take 500 mg by mouth 2 (two) times daily.     Multiple Vitamins-Minerals (MULTIVITAMIN WITH MINERALS) tablet Take 1 tablet by mouth daily.     naproxen sodium (ALEVE) 220 MG tablet Take 220 mg by mouth.     vitamin B-12 (CYANOCOBALAMIN) 250 MCG tablet Take 250 mcg by mouth daily.     Vitamin D, Ergocalciferol, (DRISDOL)  1.25 MG (50000 UT) CAPS capsule Take 1 capsule (50,000 Units total) by mouth every 7 (seven) days. 4 capsule 0   No current facility-administered medications on file prior to visit.     PAST MEDICAL HISTORY: Past Medical History:  Diagnosis Date   Back pain    Gallbladder problem    Hypertension    Swelling     PAST SURGICAL HISTORY: Past Surgical History:  Procedure Laterality Date   ABDOMINAL HYSTERECTOMY     BUNIONECTOMY  2016   R   CHOLECYSTECTOMY      SOCIAL  HISTORY: Social History   Tobacco Use   Smoking status: Never Smoker   Smokeless tobacco: Never Used  Substance Use Topics   Alcohol use: Not Currently   Drug use: Never    FAMILY HISTORY: Family History  Problem Relation Age of Onset   Heart disease Mother    Breast cancer [name]    ROS: Review of Systems  Gastrointestinal: Negative for nausea and vomiting.  Musculoskeletal:       Negative for muscle weakness.   PHYSICAL EXAM: Pt in no acute distress  RECENT LABS AND TESTS: BMET No results found for: NA, K, CL, CO2, GLUCOSE, BUN, CREATININE, CALCIUM, GFRNONAA, GFRAA Lab Results  Component Value Date   HGBA1C 5.1 12/24/2018   Lab Results  Component Value Date   INSULIN 11.9 12/24/2018   CBC    Component Value Date/Time   WBC 4.6 12/24/2018 1456   RBC 5.02 12/24/2018 1456   HGB 14.2 12/24/2018 1456   HCT 42.5 12/24/2018 1456   PLT 271 12/24/2018 1456   MCV 85 12/24/2018 1456   MCH 28.3 12/24/2018 1456   MCHC 33.4 12/24/2018 1456   RDW 12.7 12/24/2018 1456   LYMPHSABS 1.5 12/24/2018 1456   EOSABS 0.0 12/24/2018 1456   BASOSABS 0.0 12/24/2018 1456   Iron/TIBC/Ferritin/ %Sat No results found for: IRON, TIBC, FERRITIN, IRONPCTSAT Lipid Panel  No results found for: CHOL, TRIG, HDL, CHOLHDL, VLDL, LDLCALC, LDLDIRECT Hepatic Function Panel  No results found for: PROT, ALBUMIN, AST, ALT, ALKPHOS, BILITOT, BILIDIR, IBILI    Component Value Date/Time   TSH 0.461 12/24/2018 1456   Results for Jennifer Tyler (MRN [id number]) as of 05/05/2019 12:03  Ref. Range 12/24/2018 14:56  Vitamin D, 25-Hydroxy Latest Ref Range: 30.0 - 100.0 ng/mL 22.5 (L)   I, Michaelene Song, am acting as Location manager for [employer], PA-C I, [name], PA-C have reviewed above note and agree with its content

## 2019-05-19 ENCOUNTER — Encounter (INDEPENDENT_AMBULATORY_CARE_PROVIDER_SITE_OTHER): Payer: Self-pay | Admitting: Physician Assistant

## 2019-05-19 ENCOUNTER — Other Ambulatory Visit: Payer: Self-pay

## 2019-05-19 ENCOUNTER — Ambulatory Visit (INDEPENDENT_AMBULATORY_CARE_PROVIDER_SITE_OTHER): Payer: Medicare Other | Admitting: Physician Assistant

## 2019-05-19 VITALS — BP 125/79 | HR 66 | Temp 98.6°F | Ht 59.0 in | Wt 184.0 lb

## 2019-05-19 DIAGNOSIS — Z6837 Body mass index (BMI) 37.0-37.9, adult: Secondary | ICD-10-CM

## 2019-05-19 DIAGNOSIS — E559 Vitamin D deficiency, unspecified: Secondary | ICD-10-CM

## 2019-05-19 DIAGNOSIS — E8881 Metabolic syndrome: Secondary | ICD-10-CM

## 2019-05-19 MED ORDER — VITAMIN D (ERGOCALCIFEROL) 1.25 MG (50000 UNIT) PO CAPS: 50000.0000 [IU] | ORAL_CAPSULE | ORAL | 0 refills | Status: DC

## 2019-05-20 NOTE — Progress Notes (Signed)
Office: [phone]  /  Fax: [phone]   HPI:   Chief Complaint: OBESITY Jennifer Tyler is here to discuss her progress with her obesity treatment plan. She is on the Pescatarian eating plan and is following her eating plan approximately 80 % of the time. She states she is exercising 0 minutes 0 times per week. Lasean reports that she is enjoying the pescatarian plan and she feels that she is getting all of her food in. Her weight is 184 lb (83.5 kg) today and has had a weight loss of 9 pounds since her last in-office visit. She has lost 9 lbs since starting treatment with [name].  Vitamin D deficiency Jennifer Tyler has a diagnosis of vitamin D deficiency. Jennifer Tyler is currently taking weekly vit D and she denies nausea, vomiting or muscle weakness.  Insulin Resistance Jennifer Tyler has a diagnosis of insulin resistance based on her elevated fasting insulin level >5. Although Vida's blood glucose readings are still under good control, insulin resistance puts her at greater risk of metabolic syndrome and diabetes. She is not taking medications currently and continues to work on diet and exercise to decrease risk of diabetes. Jennifer Tyler denies polyphagia.  ASSESSMENT AND PLAN:  Vitamin D deficiency - Plan: Vitamin D, Ergocalciferol, (DRISDOL) 1.25 MG (50000 UT) CAPS capsule  Insulin resistance  Class 2 severe obesity with serious comorbidity and body mass index (BMI) of 37.0 to 37.9 in adult, unspecified obesity type (Jennifer Tyler)  PLAN:  Vitamin D Deficiency Jennifer Tyler was informed that low vitamin D levels contributes to fatigue and are associated with obesity, breast, and colon cancer. She agrees to continue to take prescription Vit D @50 ,000 IU every week #4 with no refills and will follow up for routine testing of vitamin D, at least 2-3 times per year. She was informed of the risk of over-replacement of vitamin D and agrees to not increase her dose unless she discusses this with [name] first. Jennifer Tyler agrees to follow up as  directed.  Insulin Resistance Jennifer Tyler will continue to work on weight loss, exercise, and decreasing simple carbohydrates in her diet to help decrease the risk of diabetes. She was informed that eating too many simple carbohydrates or too many calories at one sitting increases the likelihood of GI side effects. Jennifer Tyler agreed to follow up with [name] as directed to monitor her progress.  Obesity Jennifer Tyler is currently in the action stage of change. As such, her goal is to continue with weight loss efforts She has agreed to follow the Jennifer Tyler has been instructed to work up to a goal of 150 minutes of combined cardio and strengthening exercise per week for weight loss and overall health benefits. We discussed the following Behavioral Modification Strategies today: keeping healthy foods in the home and work on meal planning and easy cooking plans  Jennifer Tyler has agreed to follow up with our clinic in 3 weeks. She was informed of the importance of frequent follow up visits to maximize her success with intensive lifestyle modifications for her Jennifer health conditions.  ALLERGIES: No Known Allergies  MEDICATIONS: Current Outpatient Medications on File Prior to Visit  Medication Sig Dispense Refill   Ascorbic Acid (VITAMIN C) 100 MG tablet Take 100 mg by mouth daily.     aspirin EC 81 MG tablet Take 81 mg by mouth daily.     cholecalciferol (VITAMIN D3) 25 MCG (1000 UT) tablet Take 2,000 Units by mouth daily.     hydrochlorothiazide (HYDRODIURIL) 25 MG tablet Take 25 mg by  mouth daily.     lisinopril (PRINIVIL,ZESTRIL) 10 MG tablet Take 10 mg by mouth daily.     magnesium gluconate (MAGONATE) 500 MG tablet Take 500 mg by mouth 2 (two) times daily.     Jennifer Vitamins-Minerals (MULTIVITAMIN WITH MINERALS) tablet Take 1 tablet by mouth daily.     naproxen sodium (ALEVE) 220 MG tablet Take 220 mg by mouth.     vitamin B-12 (CYANOCOBALAMIN) 250 MCG tablet Take 250 mcg by mouth daily.      No current facility-administered medications on file prior to visit.     PAST MEDICAL HISTORY: Past Medical History:  Diagnosis Date   Back pain    Gallbladder problem    Hypertension    Swelling     PAST SURGICAL HISTORY: Past Surgical History:  Procedure Laterality Date   ABDOMINAL HYSTERECTOMY     BUNIONECTOMY  2016   R   CHOLECYSTECTOMY      SOCIAL HISTORY: Social History   Tobacco Use   Smoking status: Never Smoker   Smokeless tobacco: Never Used  Substance Use Topics   Alcohol use: Not Currently   Drug use: Never    FAMILY HISTORY: Family History  Problem Relation Age of Onset   Heart disease Mother    Breast cancer [name]     ROS: Review of Systems  Constitutional: Positive for weight loss.  Gastrointestinal: Negative for nausea and vomiting.  Musculoskeletal:       Negative for muscle weakness  Endo/Heme/Allergies:       Negative for polyphagia    PHYSICAL EXAM: Blood pressure 125/79, pulse 66, temperature 98.6 F (37 C), temperature source Oral, height 4\' 11"  (1.499 m), weight 184 lb (83.5 kg), SpO2 99 %. Body mass index is 37.16 kg/m. Physical Exam Vitals signs reviewed.  Constitutional:      Appearance: Normal appearance. She is well-developed. She is obese.  Cardiovascular:     Rate and Rhythm: Normal rate.  Pulmonary:     Effort: Pulmonary effort is normal.  Musculoskeletal: Normal range of motion.  Skin:    General: Skin is warm and dry.  Neurological:     Mental Status: She is alert and oriented to person, place, and time.  Psychiatric:        Mood and Affect: Mood normal.        Behavior: Behavior normal.     RECENT LABS AND TESTS: BMET No results found for: NA, K, CL, CO2, GLUCOSE, BUN, CREATININE, CALCIUM, GFRNONAA, GFRAA Lab Results  Component Value Date   HGBA1C 5.1 12/24/2018   Lab Results  Component Value Date   INSULIN 11.9 12/24/2018   CBC    Component Value Date/Time   WBC 4.6 12/24/2018  1456   RBC 5.02 12/24/2018 1456   HGB 14.2 12/24/2018 1456   HCT 42.5 12/24/2018 1456   PLT 271 12/24/2018 1456   MCV 85 12/24/2018 1456   MCH 28.3 12/24/2018 1456   MCHC 33.4 12/24/2018 1456   RDW 12.7 12/24/2018 1456   LYMPHSABS 1.5 12/24/2018 1456   EOSABS 0.0 12/24/2018 1456   BASOSABS 0.0 12/24/2018 1456   Iron/TIBC/Ferritin/ %Sat No results found for: IRON, TIBC, FERRITIN, IRONPCTSAT Lipid Panel  No results found for: CHOL, TRIG, HDL, CHOLHDL, VLDL, LDLCALC, LDLDIRECT Hepatic Function Panel  No results found for: PROT, ALBUMIN, AST, ALT, ALKPHOS, BILITOT, BILIDIR, IBILI    Component Value Date/Time   TSH 0.461 12/24/2018 1456     Ref. Range 12/24/2018 14:56  Vitamin D,  25-Hydroxy Latest Ref Range: 30.0 - 100.0 ng/mL 22.5 (L)    OBESITY BEHAVIORAL INTERVENTION VISIT  Today's visit was # 8   Starting weight: 193 lbs Starting date: 12/24/2018 Today's weight : 184 lbs Today's date: 05/19/2019 Total lbs lost to date: 9    05/19/2019  Height 4\' 11"  (1.499 m)  Weight 184 lb (83.5 kg)  BMI (Calculated) 37.14  BLOOD PRESSURE - SYSTOLIC 606  BLOOD PRESSURE - DIASTOLIC 79   Body Fat % 00.4 %  Total Body Water (lbs) 69.2 lbs    ASK: We discussed the diagnosis of obesity with Jennifer Tyler today and Jennifer Tyler agreed to give [name] permission to discuss obesity behavioral modification therapy today.  ASSESS: Jennifer Tyler has the diagnosis of obesity and her BMI today is 37.14 Jennifer Tyler is in the action stage of change   ADVISE: Jennifer Tyler was educated on the Jennifer health risks of obesity as well as the benefit of weight loss to improve her health. She was advised of the need for long term treatment and the importance of lifestyle modifications to improve her current health and to decrease her risk of future health problems.  AGREE: Jennifer dietary modification options and treatment options were discussed and  Jennifer Tyler agreed to follow the recommendations documented in the above  note.  ARRANGE: Jennifer Tyler was educated on the importance of frequent visits to treat obesity as outlined per CMS and USPSTF guidelines and agreed to schedule her next follow up appointment today.  Jennifer Tyler, am acting as transcriptionist for [name], PA-C I, [name], PA-C have reviewed above note and agree with its content

## 2019-06-08 ENCOUNTER — Other Ambulatory Visit: Payer: Self-pay

## 2019-06-08 ENCOUNTER — Encounter (INDEPENDENT_AMBULATORY_CARE_PROVIDER_SITE_OTHER): Payer: Self-pay | Admitting: Physician Assistant

## 2019-06-08 ENCOUNTER — Ambulatory Visit (INDEPENDENT_AMBULATORY_CARE_PROVIDER_SITE_OTHER): Payer: Medicare Other | Admitting: Physician Assistant

## 2019-06-08 VITALS — BP 115/74 | HR 65 | Temp 98.8°F | Ht 59.0 in | Wt 184.0 lb

## 2019-06-08 DIAGNOSIS — E559 Vitamin D deficiency, unspecified: Secondary | ICD-10-CM

## 2019-06-08 DIAGNOSIS — Z6837 Body mass index (BMI) 37.0-37.9, adult: Secondary | ICD-10-CM

## 2019-06-08 DIAGNOSIS — E8881 Metabolic syndrome: Secondary | ICD-10-CM | POA: Diagnosis not present

## 2019-06-08 NOTE — Progress Notes (Signed)
Office: [phone]  /  Fax: [phone]   HPI:   Chief Complaint: OBESITY Jennifer Tyler is here to discuss her progress with her obesity treatment plan. She is on the Pescatarian eating plan and is following her eating plan approximately 70% of the time. She states she is walking 15-30 minutes 2 times per week. Jennifer Tyler reports that she continues to enjoy the [employer]. She states she is not always getting all of her protein in but reports her hunger is controlled. Her weight is 184 lb (83.5 kg) today and has not lost weight since her last visit. She has lost 9 lbs since starting treatment with [name].  Vitamin D deficiency Jennifer Tyler has a diagnosis of Vitamin D deficiency. She is currently taking Vit D and denies nausea, vomiting or muscle weakness.  Insulin Resistance Jennifer Tyler has a diagnosis of insulin resistance based on her elevated fasting insulin level >5. Although Jennifer Tyler's blood glucose readings are still under good control, insulin resistance puts her at greater risk of metabolic syndrome and diabetes. She is on no medications currently and continues to work on diet and exercise to decrease risk of diabetes. No polyphagia.  ASSESSMENT AND PLAN:  Vitamin D deficiency - Plan: VITAMIN D 25 Hydroxy (Vit-D Deficiency, Fractures)  Insulin resistance - Plan: Comprehensive metabolic panel, Hemoglobin A1c, Insulin, random  Class 2 severe obesity with serious comorbidity and body mass index (BMI) of 37.0 to 37.9 in adult, unspecified obesity type ([place])  PLAN:  Vitamin D Deficiency Jennifer Tyler was informed that low Vitamin D levels contributes to fatigue and are associated with obesity, breast, and colon cancer. She agrees to continue taking Vit D and will have routine testing of Vitamin D. She was informed of the risk of over-replacement of Vitamin D and agrees to not increase her dose unless she discusses this with [name] first. Jennifer Tyler agrees to follow-up with our clinic in 3 weeks.  Insulin Resistance Jennifer Tyler will  continue to work on weight loss, exercise, and decreasing simple carbohydrates in her diet to help decrease the risk of diabetes. We dicussed metformin including benefits and risks. She was informed that eating too many simple carbohydrates or too many calories at one sitting increases the likelihood of GI side effects. Jennifer Tyler will continue with weight loss and will have labs checked. She will follow-up with [name] as directed to monitor her progress.  Obesity Jennifer Tyler is currently in the action stage of change. As such, her goal is to continue with weight loss efforts. She has agreed to follow the Pescatarian eating plan. Jennifer Tyler has been instructed to work up to a goal of 150 minutes of combined cardio and strengthening exercise per week for weight loss and overall health benefits. We discussed the following Behavioral Modification Strategies today: work on meal planning and easy cooking plans, and keeping healthy foods in the home.  Jennifer Tyler has agreed to follow-up with our clinic in 3 weeks. She was informed of the importance of frequent follow-up visits to maximize her success with intensive lifestyle modifications for her multiple health conditions.  ALLERGIES: No Known Allergies  MEDICATIONS: Current Outpatient Medications on File Prior to Visit  Medication Sig Dispense Refill   Ascorbic Acid (VITAMIN C) 100 MG tablet Take 100 mg by mouth daily.     aspirin EC 81 MG tablet Take 81 mg by mouth daily.     cholecalciferol (VITAMIN D3) 25 MCG (1000 UT) tablet Take 2,000 Units by mouth daily.     hydrochlorothiazide (HYDRODIURIL) 25 MG tablet Take  25 mg by mouth daily.     lisinopril (PRINIVIL,ZESTRIL) 10 MG tablet Take 10 mg by mouth daily.     magnesium gluconate (MAGONATE) 500 MG tablet Take 500 mg by mouth 2 (two) times daily.     Multiple Vitamins-Minerals (MULTIVITAMIN WITH MINERALS) tablet Take 1 tablet by mouth daily.     naproxen sodium (ALEVE) 220 MG tablet Take 220 mg by mouth.      vitamin B-12 (CYANOCOBALAMIN) 250 MCG tablet Take 250 mcg by mouth daily.     Vitamin D, Ergocalciferol, (DRISDOL) 1.25 MG (50000 UT) CAPS capsule Take 1 capsule (50,000 Units total) by mouth every 7 (seven) days. 4 capsule 0   No current facility-administered medications on file prior to visit.     PAST MEDICAL HISTORY: Past Medical History:  Diagnosis Date   Back pain    Gallbladder problem    Hypertension    Swelling     PAST SURGICAL HISTORY: Past Surgical History:  Procedure Laterality Date   ABDOMINAL HYSTERECTOMY     BUNIONECTOMY  2016   R   CHOLECYSTECTOMY      SOCIAL HISTORY: Social History   Tobacco Use   Smoking status: Never Smoker   Smokeless tobacco: Never Used  Substance Use Topics   Alcohol use: Not Currently   Drug use: Never    FAMILY HISTORY: Family History  Problem Relation Age of Onset   Heart disease Mother    Breast cancer [name]    ROS: Review of Systems  Gastrointestinal: Negative for nausea and vomiting.  Musculoskeletal:       Negative for muscle weakness.  Endo/Heme/Allergies:       Negative for polyphagia.   PHYSICAL EXAM: Blood pressure 115/74, pulse 65, temperature 98.8 F (37.1 C), temperature source Oral, height 4\' 11"  (1.499 m), weight 184 lb (83.5 kg), SpO2 98 %. Body mass index is 37.16 kg/m. Physical Exam Vitals signs reviewed.  Constitutional:      Appearance: Normal appearance. She is obese.  Cardiovascular:     Rate and Rhythm: Normal rate.     Pulses: Normal pulses.  Pulmonary:     Effort: Pulmonary effort is normal.     Breath sounds: Normal breath sounds.  Musculoskeletal: Normal range of motion.  Skin:    General: Skin is warm and dry.  Neurological:     Mental Status: She is alert and oriented to person, place, and time.  Psychiatric:        Behavior: Behavior normal.   RECENT LABS AND TESTS: BMET No results found for: NA, K, CL, CO2, GLUCOSE, BUN, CREATININE, CALCIUM, GFRNONAA,  GFRAA Lab Results  Component Value Date   HGBA1C 5.1 12/24/2018   Lab Results  Component Value Date   INSULIN 11.9 12/24/2018   CBC    Component Value Date/Time   WBC 4.6 12/24/2018 1456   RBC 5.02 12/24/2018 1456   HGB 14.2 12/24/2018 1456   HCT 42.5 12/24/2018 1456   PLT 271 12/24/2018 1456   MCV 85 12/24/2018 1456   MCH 28.3 12/24/2018 1456   MCHC 33.4 12/24/2018 1456   RDW 12.7 12/24/2018 1456   LYMPHSABS 1.5 12/24/2018 1456   EOSABS 0.0 12/24/2018 1456   BASOSABS 0.0 12/24/2018 1456   Iron/TIBC/Ferritin/ %Sat No results found for: IRON, TIBC, FERRITIN, IRONPCTSAT Lipid Panel  No results found for: CHOL, TRIG, HDL, CHOLHDL, VLDL, LDLCALC, LDLDIRECT Hepatic Function Panel  No results found for: PROT, ALBUMIN, AST, ALT, ALKPHOS, BILITOT, BILIDIR, IBILI  Component Value Date/Time   TSH 0.461 12/24/2018 1456   Results for SAMIKA, VETSCH (MRN [id number]) as of 06/08/2019 13:45  Ref. Range 12/24/2018 14:56  Vitamin D, 25-Hydroxy Latest Ref Range: 30.0 - 100.0 ng/mL 22.5 (L)   OBESITY BEHAVIORAL INTERVENTION VISIT  Today's visit was #11   Starting weight: 193 lbs Starting date: 12/24/2018 Today's weight: 184 lbs  Today's date: 06/08/2019 Total lbs lost to date: 9 At least 15 minutes were spent on discussing the following behavioral intervention visit.    06/08/2019  Height 4\' 11"  (1.499 m)  Weight 184 lb (83.5 kg)  BMI (Calculated) 37.14  BLOOD PRESSURE - SYSTOLIC 517  BLOOD PRESSURE - DIASTOLIC 74   Body Fat % 00.1 %  Total Body Water (lbs) 67.6 lbs   ASK: We discussed the diagnosis of obesity with Jennifer Tyler today and Jennifer Tyler agreed to give [name] permission to discuss obesity behavioral modification therapy today.  ASSESS: Jennifer Tyler has the diagnosis of obesity and her BMI today is 37.2. Jennifer Tyler is in the action stage of change.   ADVISE: Jennifer Tyler was educated on the multiple health risks of obesity as well as the benefit of weight loss to improve her health. She was advised  of the need for long term treatment and the importance of lifestyle modifications to improve her current health and to decrease her risk of future health problems.  AGREE: Multiple dietary modification options and treatment options were discussed and  Jennifer Tyler agreed to follow the recommendations documented in the above note.  ARRANGE: Jennifer Tyler was educated on the importance of frequent visits to treat obesity as outlined per CMS and USPSTF guidelines and agreed to schedule her next follow up appointment today.  IMichaelene Song, am acting as Location manager for [employer], PA-C

## 2019-06-09 LAB — COMPREHENSIVE METABOLIC PANEL
ALT: 13 IU/L (ref 0–32)
AST: 18 IU/L (ref 0–40)
Albumin/Globulin Ratio: 1.7 (ref 1.2–2.2)
Albumin: 4.3 g/dL (ref 3.8–4.8)
Alkaline Phosphatase: 82 IU/L (ref 39–117)
BUN/Creatinine Ratio: 25 (ref 12–28)
BUN: 13 mg/dL (ref 8–27)
Bilirubin Total: 0.3 mg/dL (ref 0.0–1.2)
CO2: 27 mmol/L (ref 20–29)
Calcium: 9.9 mg/dL (ref 8.7–10.3)
Chloride: 98 mmol/L (ref 96–106)
Creatinine, Ser: 0.51 mg/dL — ABNORMAL LOW (ref 0.57–1.00)
GFR calc Af Amer: 115 mL/min/{1.73_m2} (ref >59)
GFR calc non Af Amer: 100 mL/min/{1.73_m2} (ref >59)
Globulin, Total: 2.5 g/dL (ref 1.5–4.5)
Glucose: 75 mg/dL (ref 65–99)
Potassium: 4.2 mmol/L (ref 3.5–5.2)
Sodium: 139 mmol/L (ref 134–144)
Total Protein: 6.8 g/dL (ref 6.0–8.5)

## 2019-06-09 LAB — INSULIN, RANDOM: INSULIN: 8 u[IU]/mL (ref 2.6–24.9)

## 2019-06-09 LAB — HEMOGLOBIN A1C
Est. average glucose Bld gHb Est-mCnc: 100 mg/dL
Hgb A1c MFr Bld: 5.1 % (ref 4.8–5.6)

## 2019-06-09 LAB — VITAMIN D 25 HYDROXY (VIT D DEFICIENCY, FRACTURES): Vit D, 25-Hydroxy: 37.4 ng/mL (ref 30.0–100.0)

## 2019-06-29 ENCOUNTER — Other Ambulatory Visit: Payer: Self-pay

## 2019-06-29 ENCOUNTER — Ambulatory Visit (INDEPENDENT_AMBULATORY_CARE_PROVIDER_SITE_OTHER): Payer: Medicare Other | Admitting: Physician Assistant

## 2019-06-29 ENCOUNTER — Encounter (INDEPENDENT_AMBULATORY_CARE_PROVIDER_SITE_OTHER): Payer: Self-pay | Admitting: Physician Assistant

## 2019-06-29 VITALS — BP 144/80 | HR 63 | Temp 98.6°F | Ht 59.0 in | Wt 184.0 lb

## 2019-06-29 DIAGNOSIS — E559 Vitamin D deficiency, unspecified: Secondary | ICD-10-CM | POA: Diagnosis not present

## 2019-06-29 DIAGNOSIS — Z6837 Body mass index (BMI) 37.0-37.9, adult: Secondary | ICD-10-CM | POA: Diagnosis not present

## 2019-06-29 DIAGNOSIS — E8881 Metabolic syndrome: Secondary | ICD-10-CM | POA: Diagnosis not present

## 2019-06-29 MED ORDER — VITAMIN D (ERGOCALCIFEROL) 1.25 MG (50000 UNIT) PO CAPS: 50000.0000 [IU] | ORAL_CAPSULE | ORAL | 0 refills | Status: DC

## 2019-06-29 NOTE — Progress Notes (Signed)
Office: [phone]  /  Fax: [phone]   HPI:   Chief Complaint: OBESITY Jennifer Tyler is here to discuss her progress with her obesity treatment plan. She is on the Jennifer Tyler eating plan and is following her eating plan approximately 50% of the time. She states she is exercising 0 minutes 0 times per week. Jennifer Tyler reports that she has not been following the plan because she has been taking care of her grand kids. She is ready to get back on track.  Her weight is 184 lb (83.5 kg) today and has not lost weight since her last visit. She has lost 9 lbs since starting treatment with [name].  Vitamin D deficiency Jennifer Tyler has a diagnosis of Vitamin D deficiency. She is currently taking prescription Vit D and denies nausea, vomiting or muscle weakness.  Insulin Resistance Jennifer Tyler has a diagnosis of insulin resistance based on her elevated fasting insulin level >5. Her last level was improved but not yet normal. Although Jennifer Tyler's blood glucose readings are still under good control, insulin resistance puts her at greater risk of metabolic syndrome and diabetes. She is on no medications currently and continues to work on diet and exercise to decrease risk of diabetes.  ASSESSMENT AND PLAN:  Vitamin D deficiency - Plan: Vitamin D, Ergocalciferol, (DRISDOL) 1.25 MG (50000 UT) CAPS capsule  Insulin resistance  Class 2 severe obesity with serious comorbidity and body mass index (BMI) of 37.0 to 37.9 in adult, unspecified obesity type (Jennifer Tyler)  PLAN:  Vitamin D Deficiency Jennifer Tyler was informed that low Vitamin D levels contributes to fatigue and are associated with obesity, breast, and colon cancer. She agrees to continue to take prescription Vit D @ 50,000 IU every week #4 with 0 refills and will follow-up for routine testing of Vitamin D, at least 2-3 times per year. She was informed of the risk of over-replacement of Vitamin D and agrees to not increase her dose unless she discusses this with [name] first. Jennifer Tyler agrees to  follow-up with our clinic in 3 weeks.  Insulin Resistance Jennifer Tyler will continue to work on weight loss, exercise, and decreasing simple carbohydrates in her diet to help decrease the risk of diabetes. We dicussed metformin including benefits and risks. She was informed that eating too many simple carbohydrates or too many calories at one sitting increases the likelihood of GI side effects. Jennifer Tyler will continue with weight loss and follow-up with [name] as directed to monitor her progress.  Obesity Jennifer Tyler is currently in the action stage of change. As such, her goal is to continue with weight loss efforts. She has agreed to follow the Jennifer Tyler eating plan. Jennifer Tyler has been instructed to work up to a goal of 150 minutes of combined cardio and strengthening exercise per week for weight loss and overall health benefits. We discussed the following Behavioral Modification Strategies today: no skipping meals,  Jennifer Tyler has agreed to follow-up with our clinic in 3 weeks. She was informed of the importance of frequent follow-up visits to maximize her success with intensive lifestyle modifications for her multiple health conditions.  ALLERGIES: No Known Allergies  MEDICATIONS: Current Outpatient Medications on File Prior to Visit  Medication Sig Dispense Refill   Ascorbic Acid (VITAMIN C) 100 MG tablet Take 100 mg by mouth daily.     aspirin EC 81 MG tablet Take 81 mg by mouth daily.     hydrochlorothiazide (HYDRODIURIL) 25 MG tablet Take 25 mg by mouth daily.     lisinopril (PRINIVIL,ZESTRIL) 10 MG tablet Take  10 mg by mouth daily.     magnesium gluconate (MAGONATE) 500 MG tablet Take 500 mg by mouth 2 (two) times daily.     Multiple Vitamins-Minerals (MULTIVITAMIN WITH MINERALS) tablet Take 1 tablet by mouth daily.     naproxen sodium (ALEVE) 220 MG tablet Take 220 mg by mouth.     vitamin B-12 (CYANOCOBALAMIN) 250 MCG tablet Take 250 mcg by mouth daily.     No current facility-administered medications  on file prior to visit.     PAST MEDICAL HISTORY: Past Medical History:  Diagnosis Date   Back pain    Gallbladder problem    Hypertension    Swelling     PAST SURGICAL HISTORY: Past Surgical History:  Procedure Laterality Date   ABDOMINAL HYSTERECTOMY     BUNIONECTOMY  2016   R   CHOLECYSTECTOMY      SOCIAL HISTORY: Social History   Tobacco Use   Smoking status: Never Smoker   Smokeless tobacco: Never Used  Substance Use Topics   Alcohol use: Not Currently   Drug use: Never    FAMILY HISTORY: Family History  Problem Relation Age of Onset   Heart disease Mother    Breast cancer [name]    ROS: Review of Systems  Gastrointestinal: Negative for nausea and vomiting.  Musculoskeletal:       Negative for muscle weakness.   PHYSICAL EXAM: Blood pressure (!) 144/80, pulse 63, temperature 98.6 F (37 C), temperature source Oral, height 4\' 11"  (1.499 m), weight 184 lb (83.5 kg), SpO2 98 %. Body mass index is 37.16 kg/m. Physical Exam Vitals signs reviewed.  Constitutional:      Appearance: Normal appearance. She is obese.  Cardiovascular:     Rate and Rhythm: Normal rate.     Pulses: Normal pulses.  Pulmonary:     Effort: Pulmonary effort is normal.     Breath sounds: Normal breath sounds.  Musculoskeletal: Normal range of motion.  Skin:    General: Skin is warm and dry.  Neurological:     Mental Status: She is alert and oriented to person, place, and time.  Psychiatric:        Behavior: Behavior normal.   RECENT LABS AND TESTS: BMET    Component Value Date/Time   NA 139 06/08/2019 1118   K 4.2 06/08/2019 1118   CL 98 06/08/2019 1118   CO2 27 06/08/2019 1118   GLUCOSE 75 06/08/2019 1118   BUN 13 06/08/2019 1118   CREATININE 0.51 (L) 06/08/2019 1118   CALCIUM 9.9 06/08/2019 1118   GFRNONAA 100 06/08/2019 1118   GFRAA 115 06/08/2019 1118   Lab Results  Component Value Date   HGBA1C 5.1 06/08/2019   HGBA1C 5.1 12/24/2018   Lab  Results  Component Value Date   INSULIN 8.0 06/08/2019   INSULIN 11.9 12/24/2018   CBC    Component Value Date/Time   WBC 4.6 12/24/2018 1456   RBC 5.02 12/24/2018 1456   HGB 14.2 12/24/2018 1456   HCT 42.5 12/24/2018 1456   PLT 271 12/24/2018 1456   MCV 85 12/24/2018 1456   MCH 28.3 12/24/2018 1456   MCHC 33.4 12/24/2018 1456   RDW 12.7 12/24/2018 1456   LYMPHSABS 1.5 12/24/2018 1456   EOSABS 0.0 12/24/2018 1456   BASOSABS 0.0 12/24/2018 1456   Iron/TIBC/Ferritin/ %Sat No results found for: IRON, TIBC, FERRITIN, IRONPCTSAT Lipid Panel  No results found for: CHOL, TRIG, HDL, CHOLHDL, VLDL, LDLCALC, LDLDIRECT Hepatic Function Panel  Component Value Date/Time   PROT 6.8 06/08/2019 1118   ALBUMIN 4.3 06/08/2019 1118   AST 18 06/08/2019 1118   ALT 13 06/08/2019 1118   ALKPHOS 82 06/08/2019 1118   BILITOT 0.3 06/08/2019 1118      Component Value Date/Time   TSH 0.461 12/24/2018 1456   Results for CESILY, COSTENBADER (MRN [id number]) as of 06/29/2019 16:20  Ref. Range 06/08/2019 11:18  Vitamin D, 25-Hydroxy Latest Ref Range: 30.0 - 100.0 ng/mL 37.4   OBESITY BEHAVIORAL INTERVENTION VISIT  Today's visit was #12  Starting weight: 193 lbs Starting date: 12/24/2018 Today's weight: 184 lbs Today's date: 06/29/2019 Total lbs lost to date: 9 At least 15 minutes were spent on discussing the following behavioral intervention visit.    06/29/2019  Height 4\' 11"  (1.499 m)  Weight 184 lb (83.5 kg)  BMI (Calculated) 37.14  BLOOD PRESSURE - SYSTOLIC [id number]  BLOOD PRESSURE - DIASTOLIC 80   Body Fat % 123XX123 %  Total Body Water (lbs) 69.4 lbs   ASK: We discussed the diagnosis of obesity with Jennifer Tyler today and Jennifer Tyler agreed to give [name] permission to discuss obesity behavioral modification therapy today.  ASSESS: Jennifer Tyler has the diagnosis of obesity and her BMI today is 37.3. Jennifer Tyler is in the action stage of change.   ADVISE: Jennifer Tyler was educated on the multiple health risks of obesity as  well as the benefit of weight loss to improve her health. She was advised of the need for long term treatment and the importance of lifestyle modifications to improve her current health and to decrease her risk of future health problems.  AGREE: Multiple dietary modification options and treatment options were discussed and  Jennifer Tyler agreed to follow the recommendations documented in the above note.  ARRANGE: Jennifer Tyler was educated on the importance of frequent visits to treat obesity as outlined per CMS and USPSTF guidelines and agreed to schedule her next follow up appointment today.  Migdalia Dk, am acting as transcriptionist for [name], PA-C I, [name], PA-C have reviewed above note and agree with its content

## 2019-07-06 ENCOUNTER — Other Ambulatory Visit: Payer: Self-pay

## 2019-07-06 DIAGNOSIS — Z20822 Contact with and (suspected) exposure to covid-19: Secondary | ICD-10-CM

## 2019-07-08 LAB — NOVEL CORONAVIRUS, NAA: SARS-CoV-2, NAA: NOT DETECTED

## 2019-07-20 ENCOUNTER — Other Ambulatory Visit: Payer: Self-pay

## 2019-07-20 ENCOUNTER — Encounter (INDEPENDENT_AMBULATORY_CARE_PROVIDER_SITE_OTHER): Payer: Self-pay | Admitting: Physician Assistant

## 2019-07-20 ENCOUNTER — Ambulatory Visit (INDEPENDENT_AMBULATORY_CARE_PROVIDER_SITE_OTHER): Payer: Medicare Other | Admitting: Physician Assistant

## 2019-07-20 VITALS — BP 120/80 | HR 65 | Temp 98.4°F | Ht 59.0 in | Wt 185.0 lb

## 2019-07-20 DIAGNOSIS — E559 Vitamin D deficiency, unspecified: Secondary | ICD-10-CM | POA: Diagnosis not present

## 2019-07-20 DIAGNOSIS — E8881 Metabolic syndrome: Secondary | ICD-10-CM | POA: Diagnosis not present

## 2019-07-20 DIAGNOSIS — Z6837 Body mass index (BMI) 37.0-37.9, adult: Secondary | ICD-10-CM

## 2019-07-20 DIAGNOSIS — E88819 Insulin resistance, unspecified: Secondary | ICD-10-CM

## 2019-07-20 MED ORDER — VITAMIN D (ERGOCALCIFEROL) 1.25 MG (50000 UNIT) PO CAPS: 50000.0000 [IU] | ORAL_CAPSULE | ORAL | 0 refills | Status: DC

## 2019-07-22 NOTE — Progress Notes (Signed)
Office: [phone]  /  Fax: [phone]   HPI:   Chief Complaint: OBESITY Brinklee is here to discuss her progress with her obesity treatment plan. She is on the Pescatarian eating plan and is following her eating plan approximately 50 % of the time. She states she is walking 20 minutes 2 times per week. Olanna reports that she eats on the plan for breakfast and lunch, but she has trouble with dinner, because of what she is cooking for her husband. Her weight is 185 lb (83.9 kg) today and has had a weight gain of 1 pound over a period of 3 weeks since her last visit. She has lost 8 lbs since starting treatment with [name].  Vitamin D deficiency Vernica has a diagnosis of vitamin D deficiency. She is currently taking vit D and denies nausea, vomiting or muscle weakness.  Insulin Resistance Camani has a diagnosis of insulin resistance based on her elevated fasting insulin level >5. Although Ercelle's blood glucose readings are still under good control, insulin resistance puts her at greater risk of metabolic syndrome and diabetes. She is not taking medications currently and continues to work on diet and exercise to decrease risk of diabetes. Aviv denies polyphagia.   ASSESSMENT AND PLAN:  Vitamin D deficiency - Plan: Vitamin D, Ergocalciferol, (DRISDOL) 1.25 MG (50000 UT) CAPS capsule  Insulin resistance  Class 2 severe obesity with serious comorbidity and body mass index (BMI) of 37.0 to 37.9 in adult, unspecified obesity type ([place])  PLAN:  Vitamin D Deficiency Lincy was informed that low vitamin D levels contributes to fatigue and are associated with obesity, breast, and colon cancer. [name] agrees to continue to take prescription Vit D @50 ,000 IU every week #4 with no refills and she will follow up for routine testing of vitamin D, at least 2-3 times per year. She was informed of the risk of over-replacement of vitamin D and agrees to not increase her dose unless she discusses this with [name] first. Elysha  agrees to follow up with our clinic in 2 weeks,  Insulin Resistance Layah will continue to work on weight loss, exercise, and decreasing simple carbohydrates in her diet to help decrease the risk of diabetes. She was informed that eating too many simple carbohydrates or too many calories at one sitting increases the likelihood of GI side effects. Artina agreed to follow up with [name] as directed to monitor her progress.  Obesity Annarose is currently in the action stage of change. As such, her goal is to continue with weight loss efforts She has agreed to follow the [place] has been instructed to work up to a goal of 150 minutes of combined cardio and strengthening exercise per week for weight loss and overall health benefits. We discussed the following Behavioral Modification Strategies today: keeping healthy foods in the home and work on meal planning and easy cooking plans  Maddigan has agreed to follow up with our clinic in 2 weeks. She was informed of the importance of frequent follow up visits to maximize her success with intensive lifestyle modifications for her multiple health conditions.  ALLERGIES: No Known Allergies  MEDICATIONS: Current Outpatient Medications on File Prior to Visit  Medication Sig Dispense Refill   Ascorbic Acid (VITAMIN C) 100 MG tablet Take 100 mg by mouth daily.     aspirin EC 81 MG tablet Take 81 mg by mouth daily.     hydrochlorothiazide (HYDRODIURIL) 25 MG tablet Take 25 mg by mouth daily.  lisinopril (PRINIVIL,ZESTRIL) 10 MG tablet Take 10 mg by mouth daily.     magnesium gluconate (MAGONATE) 500 MG tablet Take 500 mg by mouth 2 (two) times daily.     Multiple Vitamins-Minerals (MULTIVITAMIN WITH MINERALS) tablet Take 1 tablet by mouth daily.     naproxen sodium (ALEVE) 220 MG tablet Take 220 mg by mouth.     vitamin B-12 (CYANOCOBALAMIN) 250 MCG tablet Take 250 mcg by mouth daily.     No current facility-administered medications on  file prior to visit.     PAST MEDICAL HISTORY: Past Medical History:  Diagnosis Date   Back pain    Gallbladder problem    Hypertension    Swelling     PAST SURGICAL HISTORY: Past Surgical History:  Procedure Laterality Date   ABDOMINAL HYSTERECTOMY     BUNIONECTOMY  2016   R   CHOLECYSTECTOMY      SOCIAL HISTORY: Social History   Tobacco Use   Smoking status: Never Smoker   Smokeless tobacco: Never Used  Substance Use Topics   Alcohol use: Not Currently   Drug use: Never    FAMILY HISTORY: Family History  Problem Relation Age of Onset   Heart disease Mother    Breast cancer [name]     ROS: Review of Systems  Constitutional: Negative for weight loss.  Gastrointestinal: Negative for nausea and vomiting.  Musculoskeletal:       Negative for muscle weakness  Endo/Heme/Allergies:       Negative for polyphagia    PHYSICAL EXAM: Blood pressure 120/80, pulse 65, temperature 98.4 F (36.9 C), temperature source Oral, height 4\' 11"  (1.499 m), weight 185 lb (83.9 kg), SpO2 99 %. Body mass index is 37.37 kg/m. Physical Exam Vitals signs reviewed.  Constitutional:      Appearance: Normal appearance. She is well-developed. She is obese.  Cardiovascular:     Rate and Rhythm: Normal rate.  Pulmonary:     Effort: Pulmonary effort is normal.  Musculoskeletal: Normal range of motion.  Skin:    General: Skin is warm and dry.  Neurological:     Mental Status: She is alert and oriented to person, place, and time.  Psychiatric:        Mood and Affect: Mood normal.        Behavior: Behavior normal.     RECENT LABS AND TESTS: BMET    Component Value Date/Time   NA 139 06/08/2019 1118   K 4.2 06/08/2019 1118   CL 98 06/08/2019 1118   CO2 27 06/08/2019 1118   GLUCOSE 75 06/08/2019 1118   BUN 13 06/08/2019 1118   CREATININE 0.51 (L) 06/08/2019 1118   CALCIUM 9.9 06/08/2019 1118   GFRNONAA 100 06/08/2019 1118   GFRAA 115 06/08/2019 1118   Lab  Results  Component Value Date   HGBA1C 5.1 06/08/2019   HGBA1C 5.1 12/24/2018   Lab Results  Component Value Date   INSULIN 8.0 06/08/2019   INSULIN 11.9 12/24/2018   CBC    Component Value Date/Time   WBC 4.6 12/24/2018 1456   RBC 5.02 12/24/2018 1456   HGB 14.2 12/24/2018 1456   HCT 42.5 12/24/2018 1456   PLT 271 12/24/2018 1456   MCV 85 12/24/2018 1456   MCH 28.3 12/24/2018 1456   MCHC 33.4 12/24/2018 1456   RDW 12.7 12/24/2018 1456   LYMPHSABS 1.5 12/24/2018 1456   EOSABS 0.0 12/24/2018 1456   BASOSABS 0.0 12/24/2018 1456   Iron/TIBC/Ferritin/ %Sat No results  found for: IRON, TIBC, FERRITIN, IRONPCTSAT Lipid Panel  No results found for: CHOL, TRIG, HDL, CHOLHDL, VLDL, LDLCALC, LDLDIRECT Hepatic Function Panel     Component Value Date/Time   PROT 6.8 06/08/2019 1118   ALBUMIN 4.3 06/08/2019 1118   AST 18 06/08/2019 1118   ALT 13 06/08/2019 1118   ALKPHOS 82 06/08/2019 1118   BILITOT 0.3 06/08/2019 1118      Component Value Date/Time   TSH 0.461 12/24/2018 1456     Ref. Range 06/08/2019 11:18  Vitamin D, 25-Hydroxy Latest Ref Range: 30.0 - 100.0 ng/mL 37.4    OBESITY BEHAVIORAL INTERVENTION VISIT  Today's visit was # 13   Starting weight: 193 lbs Starting date: 12/24/2018 Today's weight : 185 lbs Today's date: 07/20/2019 Total lbs lost to date: 8    07/20/2019  Height 4\' 11"  (1.499 m)  Weight 185 lb (83.9 kg)  BMI (Calculated) 37.35  BLOOD PRESSURE - SYSTOLIC [id number]  BLOOD PRESSURE - DIASTOLIC 80   Body Fat % 123456 %  Total Body Water (lbs) 68.8 lbs    ASK: We discussed the diagnosis of obesity with Oneal Deputy today and Sonja agreed to give [name] permission to discuss obesity behavioral modification therapy today.  ASSESS: Welcome has the diagnosis of obesity and her BMI today is 37.35 Adriyana is in the action stage of change   ADVISE: Shataya was educated on the multiple health risks of obesity as well as the benefit of weight loss to improve her health. She  was advised of the need for long term treatment and the importance of lifestyle modifications to improve her current health and to decrease her risk of future health problems.  AGREE: Multiple dietary modification options and treatment options were discussed and  Tangia agreed to follow the recommendations documented in the above note.  ARRANGE: Syd was educated on the importance of frequent visits to treat obesity as outlined per CMS and USPSTF guidelines and agreed to schedule her next follow up appointment today.  Corey Skains, am acting as transcriptionist for [name], PA-C I, [name], PA-C have reviewed above note and agree with its content

## 2019-08-03 ENCOUNTER — Ambulatory Visit (INDEPENDENT_AMBULATORY_CARE_PROVIDER_SITE_OTHER): Payer: Medicare Other | Admitting: Physician Assistant

## 2019-08-10 ENCOUNTER — Other Ambulatory Visit: Payer: Self-pay

## 2019-08-10 ENCOUNTER — Ambulatory Visit (INDEPENDENT_AMBULATORY_CARE_PROVIDER_SITE_OTHER): Payer: Medicare Other | Admitting: Physician Assistant

## 2019-08-10 ENCOUNTER — Encounter (INDEPENDENT_AMBULATORY_CARE_PROVIDER_SITE_OTHER): Payer: Self-pay | Admitting: Physician Assistant

## 2019-08-10 VITALS — BP 126/76 | HR 64 | Temp 98.5°F | Ht 59.0 in | Wt 184.0 lb

## 2019-08-10 DIAGNOSIS — E8881 Metabolic syndrome: Secondary | ICD-10-CM | POA: Diagnosis not present

## 2019-08-10 DIAGNOSIS — Z6837 Body mass index (BMI) 37.0-37.9, adult: Secondary | ICD-10-CM

## 2019-08-10 DIAGNOSIS — E559 Vitamin D deficiency, unspecified: Secondary | ICD-10-CM | POA: Diagnosis not present

## 2019-08-10 MED ORDER — VITAMIN D (ERGOCALCIFEROL) 1.25 MG (50000 UNIT) PO CAPS: 50000.0000 [IU] | ORAL_CAPSULE | ORAL | 0 refills | Status: DC

## 2019-08-11 NOTE — Progress Notes (Signed)
Office: [phone]  /  Fax: [phone]   HPI:   Chief Complaint: OBESITY Jennifer Tyler is here to discuss her progress with her obesity treatment plan. She is on the Pescatarian eating plan and is following her eating plan approximately 70% of the time. She states she is walking 20-25 minutes 3 times per week. Jennifer Tyler reports that she is not getting all of her protein in daily. She sometimes only feels like eating 2 meals daily. Her weight is 184 lb (83.5 kg) today and has had a weight loss of 1 pound over a period of 3 weeks since her last visit. She has lost 9 lbs since starting treatment with [name].  Vitamin D deficiency Jennifer Tyler has a diagnosis of Vitamin D deficiency. She is currently taking prescription Vit D and denies nausea, vomiting or muscle weakness.  Insulin Resistance Jennifer Tyler has a diagnosis of insulin resistance based on her elevated fasting insulin level >5. Although Jennifer Tyler's blood glucose readings are still under good control, insulin resistance puts her at greater risk of metabolic syndrome and diabetes. She is on no medications currently and continues to work on diet and exercise to decrease risk of diabetes. No polyphagia.  ASSESSMENT AND PLAN:  Vitamin D deficiency - Plan: Vitamin D, Ergocalciferol, (DRISDOL) 1.25 MG (50000 UT) CAPS capsule  Insulin resistance  Class 2 severe obesity with serious comorbidity and body mass index (BMI) of 37.0 to 37.9 in adult, unspecified obesity type ([place])  PLAN:  Vitamin D Deficiency Jennifer Tyler was informed that low Vitamin D levels contributes to fatigue and are associated with obesity, breast, and colon cancer. She agrees to continue to take prescription Vit D @ 50,000 IU every week #4 with 0 refills and will follow-up for routine testing of Vitamin D, at least 2-3 times per year. She was informed of the risk of over-replacement of Vitamin D and agrees to not increase her dose unless she discusses this with [name] first. Jennifer Tyler agrees to follow-up with our  clinic in 3 weeks.  Insulin Resistance Jennifer Tyler will continue to work on weight loss, exercise, and decreasing simple carbohydrates in her diet to help decrease the risk of diabetes. We dicussed metformin including benefits and risks. She was informed that eating too many simple carbohydrates or too many calories at one sitting increases the likelihood of GI side effects. Jennifer Tyler will continue with weight loss and follow-up with [name] as directed to monitor her progress.  Obesity Jennifer Tyler is currently in the action stage of change. As such, her goal is to continue with weight loss efforts. She has agreed to follow the Pescatarian eating plan. Jennifer Tyler has been instructed to work up to a goal of 150 minutes of combined cardio and strengthening exercise per week for weight loss and overall health benefits. We discussed the following Behavioral Modification Strategies today: work on meal planning and easy cooking plans, and keeping healthy foods in the home.  Jennifer Tyler has agreed to follow-up with our clinic in 3 weeks. She was informed of the importance of frequent follow-up visits to maximize her success with intensive lifestyle modifications for her multiple health conditions.  ALLERGIES: No Known Allergies  MEDICATIONS: Current Outpatient Medications on File Prior to Visit  Medication Sig Dispense Refill   Ascorbic Acid (VITAMIN C) 100 MG tablet Take 100 mg by mouth daily.     aspirin EC 81 MG tablet Take 81 mg by mouth daily.     hydrochlorothiazide (HYDRODIURIL) 25 MG tablet Take 25 mg by mouth daily.  lisinopril (PRINIVIL,ZESTRIL) 10 MG tablet Take 10 mg by mouth daily.     magnesium gluconate (MAGONATE) 500 MG tablet Take 500 mg by mouth 2 (two) times daily.     Multiple Vitamins-Minerals (MULTIVITAMIN WITH MINERALS) tablet Take 1 tablet by mouth daily.     naproxen sodium (ALEVE) 220 MG tablet Take 220 mg by mouth.     vitamin B-12 (CYANOCOBALAMIN) 250 MCG tablet Take 250 mcg by mouth daily.       No current facility-administered medications on file prior to visit.     PAST MEDICAL HISTORY: Past Medical History:  Diagnosis Date   Back pain    Gallbladder problem    Hypertension    Swelling     PAST SURGICAL HISTORY: Past Surgical History:  Procedure Laterality Date   ABDOMINAL HYSTERECTOMY     BUNIONECTOMY  2016   R   CHOLECYSTECTOMY      SOCIAL HISTORY: Social History   Tobacco Use   Smoking status: Never Smoker   Smokeless tobacco: Never Used  Substance Use Topics   Alcohol use: Not Currently   Drug use: Never    FAMILY HISTORY: Family History  Problem Relation Age of Onset   Heart disease Mother    Breast cancer [name]    ROS: Review of Systems  Gastrointestinal: Negative for nausea and vomiting.  Musculoskeletal:       Negative for muscle weakness.  Endo/Heme/Allergies:       Negative for polyphagia.   PHYSICAL EXAM: Blood pressure 126/76, pulse 64, temperature 98.5 F (36.9 C), temperature source Oral, height 4\' 11"  (1.499 m), weight 184 lb (83.5 kg), SpO2 99 %. Body mass index is 37.16 kg/m. Physical Exam Vitals signs reviewed.  Constitutional:      Appearance: Normal appearance. She is obese.  Cardiovascular:     Rate and Rhythm: Normal rate.     Pulses: Normal pulses.  Pulmonary:     Effort: Pulmonary effort is normal.     Breath sounds: Normal breath sounds.  Musculoskeletal: Normal range of motion.  Skin:    General: Skin is warm and dry.  Neurological:     Mental Status: She is alert and oriented to person, place, and time.  Psychiatric:        Behavior: Behavior normal.   RECENT LABS AND TESTS: BMET    Component Value Date/Time   NA 139 06/08/2019 1118   K 4.2 06/08/2019 1118   CL 98 06/08/2019 1118   CO2 27 06/08/2019 1118   GLUCOSE 75 06/08/2019 1118   BUN 13 06/08/2019 1118   CREATININE 0.51 (L) 06/08/2019 1118   CALCIUM 9.9 06/08/2019 1118   GFRNONAA 100 06/08/2019 1118   GFRAA 115 06/08/2019  1118   Lab Results  Component Value Date   HGBA1C 5.1 06/08/2019   HGBA1C 5.1 12/24/2018   Lab Results  Component Value Date   INSULIN 8.0 06/08/2019   INSULIN 11.9 12/24/2018   CBC    Component Value Date/Time   WBC 4.6 12/24/2018 1456   RBC 5.02 12/24/2018 1456   HGB 14.2 12/24/2018 1456   HCT 42.5 12/24/2018 1456   PLT 271 12/24/2018 1456   MCV 85 12/24/2018 1456   MCH 28.3 12/24/2018 1456   MCHC 33.4 12/24/2018 1456   RDW 12.7 12/24/2018 1456   LYMPHSABS 1.5 12/24/2018 1456   EOSABS 0.0 12/24/2018 1456   BASOSABS 0.0 12/24/2018 1456   Iron/TIBC/Ferritin/ %Sat No results found for: IRON, TIBC, FERRITIN, IRONPCTSAT Lipid Panel  No results found for: CHOL, TRIG, HDL, CHOLHDL, VLDL, LDLCALC, LDLDIRECT Hepatic Function Panel     Component Value Date/Time   PROT 6.8 06/08/2019 1118   ALBUMIN 4.3 06/08/2019 1118   AST 18 06/08/2019 1118   ALT 13 06/08/2019 1118   ALKPHOS 82 06/08/2019 1118   BILITOT 0.3 06/08/2019 1118      Component Value Date/Time   TSH 0.461 12/24/2018 1456   Results for SOUA, MCMENEMY (MRN [id number]) as of 08/11/2019 09:19  Ref. Range 06/08/2019 11:18  Vitamin D, 25-Hydroxy Latest Ref Range: 30.0 - 100.0 ng/mL 37.4   OBESITY BEHAVIORAL INTERVENTION VISIT  Today's visit was #14  Starting weight: 193 lbs Starting date: 12/24/2018 Today's weight: 184 lbs  Today's date: 08/10/2019 Total lbs lost to date: 9 At least 15 minutes were spent on discussing the following behavioral intervention visit.    08/10/2019  Height 4\' 11"  (1.499 m)  Weight 184 lb (83.5 kg)  BMI (Calculated) 37.14  BLOOD PRESSURE - SYSTOLIC [id number]  BLOOD PRESSURE - DIASTOLIC 76   Body Fat % 123XX123 %   ASK: We discussed the diagnosis of obesity with Jennifer Tyler today and Jennifer Tyler agreed to give [name] permission to discuss obesity behavioral modification therapy today.  ASSESS: Jennifer Tyler has the diagnosis of obesity and her BMI today is 37.2. Jennifer Tyler is in the action stage of change.    ADVISE: Jennifer Tyler was educated on the multiple health risks of obesity as well as the benefit of weight loss to improve her health. She was advised of the need for long term treatment and the importance of lifestyle modifications to improve her current health and to decrease her risk of future health problems.  AGREE: Multiple dietary modification options and treatment options were discussed and  Jennifer Tyler agreed to follow the recommendations documented in the above note.  ARRANGE: Jennifer Tyler was educated on the importance of frequent visits to treat obesity as outlined per CMS and USPSTF guidelines and agreed to schedule her next follow up appointment today.  Jennifer Tyler, am acting as Location manager for [name], PA-C I, [name], PA-C have reviewed above note and agree with its content

## 2019-08-31 ENCOUNTER — Ambulatory Visit (INDEPENDENT_AMBULATORY_CARE_PROVIDER_SITE_OTHER): Payer: Medicare Other | Admitting: Physician Assistant

## 2019-08-31 ENCOUNTER — Encounter (INDEPENDENT_AMBULATORY_CARE_PROVIDER_SITE_OTHER): Payer: Self-pay | Admitting: Physician Assistant

## 2019-08-31 ENCOUNTER — Other Ambulatory Visit: Payer: Self-pay

## 2019-08-31 VITALS — BP 121/79 | HR 64 | Temp 98.4°F | Ht 59.0 in | Wt 183.0 lb

## 2019-08-31 DIAGNOSIS — Z6837 Body mass index (BMI) 37.0-37.9, adult: Secondary | ICD-10-CM | POA: Diagnosis not present

## 2019-08-31 DIAGNOSIS — F3289 Other specified depressive episodes: Secondary | ICD-10-CM

## 2019-08-31 DIAGNOSIS — E559 Vitamin D deficiency, unspecified: Secondary | ICD-10-CM | POA: Diagnosis not present

## 2019-09-01 NOTE — Progress Notes (Signed)
Office: [phone]  /  Fax: [phone]   HPI:   Chief Complaint: OBESITY Jennifer Tyler is here to discuss her progress with her obesity treatment plan. She is on the Pescatarian eating plan and is following her eating plan approximately 50 % of the time. She states she is walking 30 minutes 2 times per week. Jennifer Tyler reports that she is on the plan for her meals, but she is doing some excessive snacking, due to stress. She states her husband questions her on why she continues her visits when she is not fully eating on the plan, which is upsetting to her. Her weight is 183 lb (83 kg) today and has had a weight loss of 1 pound over a period of 3 weeks since her last visit. She has lost 10 lbs since starting treatment with [name].  Vitamin D deficiency Jennifer Tyler has a diagnosis of vitamin D deficiency. Jennifer Tyler is currently taking vit D and she denies nausea, vomiting or muscle weakness.  Depression with emotional eating behaviors Jennifer Tyler reports that she eats in response to stress. She is struggling with emotional eating and using food for comfort to the extent that it is negatively impacting her health. She often snacks when she is not hungry. Jennifer Tyler sometimes feels she is out of control and then feels guilty that she made poor food choices. She has been working on behavior modification techniques to help reduce her emotional eating and has been somewhat successful. She denies suicidal or homicidal ideations, and she denies depression.  ASSESSMENT AND PLAN:  Vitamin D deficiency  Other depression - with emotional eating  Class 2 severe obesity with serious comorbidity and body mass index (BMI) of 37.0 to 37.9 in adult, unspecified obesity type ([place])  PLAN:  Vitamin D Deficiency Jennifer Tyler was informed that low vitamin D levels contributes to fatigue and are associated with obesity, breast, and colon cancer. [name] will continue to take prescription Vit D @50 ,000 IU every week and she will follow up for routine testing of  vitamin D, at least 2-3 times per year. She was informed of the risk of over-replacement of vitamin D and agrees to not increase her dose unless she discusses this with [name] first.  Depression with Emotional Eating Behaviors We discussed behavior modification techniques today to help Jennifer Tyler deal with her emotional eating and depression. We will refer patient to Dr. Mallie Mussel our bariatric psychologist.  Obesity Jennifer Tyler is currently in the action stage of change. As such, her goal is to continue with weight loss efforts She has agreed to follow the Jennifer Tyler has been instructed to work up to a goal of 150 minutes of combined cardio and strengthening exercise per week for weight loss and overall health benefits. We discussed the following Behavioral Modification Strategies today: dealing with family or coworker sabotage and ways to avoid night time snacking  Jennifer Tyler has agreed to follow up with our clinic in 3 weeks. She was informed of the importance of frequent follow up visits to maximize her success with intensive lifestyle modifications for her multiple health conditions.  ALLERGIES: No Known Allergies  MEDICATIONS: Current Outpatient Medications on File Prior to Visit  Medication Sig Dispense Refill   Ascorbic Acid (VITAMIN C) 100 MG tablet Take 100 mg by mouth daily.     aspirin EC 81 MG tablet Take 81 mg by mouth daily.     hydrochlorothiazide (HYDRODIURIL) 25 MG tablet Take 25 mg by mouth daily.     lisinopril (PRINIVIL,ZESTRIL) 10 MG  tablet Take 10 mg by mouth daily.     magnesium gluconate (MAGONATE) 500 MG tablet Take 500 mg by mouth 2 (two) times daily.     Multiple Vitamins-Minerals (MULTIVITAMIN WITH MINERALS) tablet Take 1 tablet by mouth daily.     naproxen sodium (ALEVE) 220 MG tablet Take 220 mg by mouth.     vitamin B-12 (CYANOCOBALAMIN) 250 MCG tablet Take 250 mcg by mouth daily.     Vitamin D, Ergocalciferol, (DRISDOL) 1.25 MG (50000 UT) CAPS capsule  Take 1 capsule (50,000 Units total) by mouth every 7 (seven) days. 4 capsule 0   No current facility-administered medications on file prior to visit.     PAST MEDICAL HISTORY: Past Medical History:  Diagnosis Date   Back pain    Gallbladder problem    Hypertension    Swelling     PAST SURGICAL HISTORY: Past Surgical History:  Procedure Laterality Date   ABDOMINAL HYSTERECTOMY     BUNIONECTOMY  2016   R   CHOLECYSTECTOMY      SOCIAL HISTORY: Social History   Tobacco Use   Smoking status: Never Smoker   Smokeless tobacco: Never Used  Substance Use Topics   Alcohol use: Not Currently   Drug use: Never    FAMILY HISTORY: Family History  Problem Relation Age of Onset   Heart disease Mother    Breast cancer [name]     ROS: Review of Systems  Constitutional: Positive for weight loss.  Gastrointestinal: Negative for nausea and vomiting.  Musculoskeletal:       Negative for muscle weakness  Psychiatric/Behavioral: Positive for depression. Negative for suicidal ideas.    PHYSICAL EXAM: Blood pressure 121/79, pulse 64, temperature 98.4 F (36.9 C), temperature source Oral, height 4\' 11"  (1.499 m), weight 183 lb (83 kg), SpO2 99 %. Body mass index is 36.96 kg/m. Physical Exam Vitals signs reviewed.  Constitutional:      Appearance: Normal appearance. She is well-developed. She is obese.  Cardiovascular:     Rate and Rhythm: Normal rate.  Pulmonary:     Effort: Pulmonary effort is normal.  Musculoskeletal: Normal range of motion.  Skin:    General: Skin is warm and dry.  Neurological:     Mental Status: She is alert and oriented to person, place, and time.  Psychiatric:        Mood and Affect: Mood normal.        Behavior: Behavior normal.     RECENT LABS AND TESTS: BMET    Component Value Date/Time   NA 139 06/08/2019 1118   K 4.2 06/08/2019 1118   CL 98 06/08/2019 1118   CO2 27 06/08/2019 1118   GLUCOSE 75 06/08/2019 1118   BUN 13  06/08/2019 1118   CREATININE 0.51 (L) 06/08/2019 1118   CALCIUM 9.9 06/08/2019 1118   GFRNONAA 100 06/08/2019 1118   GFRAA 115 06/08/2019 1118   Lab Results  Component Value Date   HGBA1C 5.1 06/08/2019   HGBA1C 5.1 12/24/2018   Lab Results  Component Value Date   INSULIN 8.0 06/08/2019   INSULIN 11.9 12/24/2018   CBC    Component Value Date/Time   WBC 4.6 12/24/2018 1456   RBC 5.02 12/24/2018 1456   HGB 14.2 12/24/2018 1456   HCT 42.5 12/24/2018 1456   PLT 271 12/24/2018 1456   MCV 85 12/24/2018 1456   MCH 28.3 12/24/2018 1456   MCHC 33.4 12/24/2018 1456   RDW 12.7 12/24/2018 1456   LYMPHSABS  1.5 12/24/2018 1456   EOSABS 0.0 12/24/2018 1456   BASOSABS 0.0 12/24/2018 1456   Iron/TIBC/Ferritin/ %Sat No results found for: IRON, TIBC, FERRITIN, IRONPCTSAT Lipid Panel  No results found for: CHOL, TRIG, HDL, CHOLHDL, VLDL, LDLCALC, LDLDIRECT Hepatic Function Panel     Component Value Date/Time   PROT 6.8 06/08/2019 1118   ALBUMIN 4.3 06/08/2019 1118   AST 18 06/08/2019 1118   ALT 13 06/08/2019 1118   ALKPHOS 82 06/08/2019 1118   BILITOT 0.3 06/08/2019 1118      Component Value Date/Time   TSH 0.461 12/24/2018 1456     Ref. Range 06/08/2019 11:18  Vitamin D, 25-Hydroxy Latest Ref Range: 30.0 - 100.0 ng/mL 37.4    OBESITY BEHAVIORAL INTERVENTION VISIT  Today's visit was # 15   Starting weight: 193 lbs Starting date: 12/24/2018 Today's weight : 183 lbs Today's date: 08/31/2019 Total lbs lost to date: 10    08/31/2019  Height 4\' 11"  (1.499 m)  Weight 183 lb (83 kg)  BMI (Calculated) 36.94  BLOOD PRESSURE - SYSTOLIC [id number]  BLOOD PRESSURE - DIASTOLIC 79   Body Fat % 123456 %  Total Body Water (lbs) 67.4 lbs    ASK: We discussed the diagnosis of obesity with Jennifer Tyler today and Jennifer Tyler agreed to give [name] permission to discuss obesity behavioral modification therapy today.  ASSESS: Bryant has the diagnosis of obesity and her BMI today is 36.94 Jennifer Tyler is in the  action stage of change   ADVISE: Jennifer Tyler was educated on the multiple health risks of obesity as well as the benefit of weight loss to improve her health. She was advised of the need for long term treatment and the importance of lifestyle modifications to improve her current health and to decrease her risk of future health problems.  AGREE: Multiple dietary modification options and treatment options were discussed and  Jennifer Tyler agreed to follow the recommendations documented in the above note.  ARRANGE: Jennifer Tyler was educated on the importance of frequent visits to treat obesity as outlined per CMS and USPSTF guidelines and agreed to schedule her next follow up appointment today.  Corey Skains, am acting as transcriptionist for Jennifer Potash, PA-C I, Jennifer Potash, PA-C have reviewed above note and agree with its content

## 2019-09-09 NOTE — Progress Notes (Signed)
Office: [phone]  /  Fax: [phone]    Date: September 21, 2019  Time Seen: 10:57am Duration: 33 minutes Provider: [name], PsyD Type of Session: Intake for Individual Therapy  Type of Contact: Face-to-face  Informed Consent for In-Person Services During COVID-19: During today's appointment, information about the decision to initiate in-person services in light of the Jennifer Tyler public health crisis was discussed. Jennifer Tyler and this provider agreed to meet in person for some or all future appointments. If there is Jennifer resurgence of the pandemic or other health concerns arise, telepsychological services may be initiated and any related concerns will be discussed and an attempt to address them will be made. Jennifer Tyler verbally acknowledged understanding that if necessary, this provider may determine there is Jennifer need to initiate telepsychological services for everyone's well-being. Jennifer Tyler expressed understanding she may request to initiate telepsychological services, and that request will be respected as long as it is feasible and clinically appropriate. Regarding telepsychological services, Jennifer Tyler acknowledged she is ultimately responsible for understanding her insurance benefits as it relates to reimbursement of telepsychological services. Moreover, the risks for opting for in-person services was discussed. Jennifer Tyler verbally acknowledged understanding that by coming to the office, she is assuming the risk of exposure to the coronavirus or other public risk, and the risk may increase if Jennifer Tyler travels by public transportation, cab, or ridesharing service. To obtain in-person services, Jennifer Tyler verbally agreed to taking certain precautions (e.g., screening prior to appointment; universal masking; social distancing of 6 feet; proper hand hygiene; no visitors) set forth by Jennifer Tyler to keep everyone safe from exposure, sickness, and possible death. This information was shared by front desk staff either at the time of  scheduling and/or during the check-in process. Jennifer Tyler expressed understanding that should she not adhere to these safeguards, it may result in starting/returning to Jennifer telepsychological service arrangement and/or the exploration of other options for treatment. Jennifer Tyler acknowledged understanding that Jennifer Tyler will follow the protocol set forth by Jennifer Tyler should Jennifer patient present with Jennifer fever or other symptoms or disclose recent exposure, which will include rescheduling the appointment. Furthermore, Jennifer Tyler acknowledged understanding that precautions may change if additional local, state or federal orders or guidelines are published. This provider also shared that if Jennifer Tyler tests positive for the coronavirus, this provider may be required to notify local health authorities that Jennifer Tyler was in the Jennifer Tyler clinic. Only minimum information necessary for data collection will be disclosed. This provider will follow Jennifer Tyler's disclosure policy should this provider or staff test positive for the coronavirus. To avoid handling of paper/writing instruments and increasing likelihood of touching, verbal consent was obtained by Jennifer Tyler during today's appointment prior to proceeding. Jennifer Tyler provided verbal consent to proceed, and acknowledged understanding that by verbally consenting to proceed, she is agreeable to all information noted above.   Informed Consent: The provider's role was explained to Jennifer Tyler. The provider reviewed and discussed issues of confidentiality, privacy, and limits therein (e.g., reporting obligations). In addition to verbal informed consent, written informed consent for psychological services was obtained from Jennifer Tyler prior to the initial intake interview. Written consent included information concerning the practice, financial arrangements, and confidentiality and patients' rights. Since the clinic is not Jennifer 24/7 crisis center, mental health emergency resources were shared, and  the provider explained MyChart, e-mail, voicemail, and/or other messaging systems should be utilized only for non-emergency reasons. This provider also explained that information obtained during appointments will be placed in Jennifer Tyler's medical  record in Jennifer confidential manner and relevant information will be shared with other providers at Jennifer Tyler that she meets with for coordination of care. Jennifer Tyler verbally acknowledged understanding of the aforementioned, and agreed to use mental health emergency resources discussed if needed. Moreover, Jennifer Tyler agreed information may be shared with other Jennifer Tyler providers as needed for coordination of care. By signing the service agreement document, Jennifer Tyler provided written consent for coordination of care.   Chief Complaint/HPI: Jennifer Tyler was referred by Jennifer Potash, PA-C due to depression with emotional eating behaviors. Per the note for the visit with Jennifer Potash, PA-C on August 31, 2019, "Jennifer Tyler reports that she eats in response to stress. She is struggling with emotional eating and using food for comfort to the extent that it is negatively impacting her health. She often snacks when she is not hungry. Jennifer Tyler sometimes feels she is out of control and then feels guilty that she made poor food choices. She has been working on behavior modification techniques to help reduce her emotional eating and has been somewhat successful. She denies suicidal or homicidal ideations, and she denies depression." During the initial appointment with Jennifer [facility] at [facility] on December 24, 2018, Jennifer Tyler reported experiencing the following: significant food cravings issues , snacking frequently in the evenings, frequently drinking liquids with calories, frequently making poor food choices, frequently eating larger portions than normal  and struggling with emotional eating. Jennifer Tyler's Food and Mood (modified PHQ-9) score was 6.   During today's  appointment, Jennifer Tyler shared she has hit Jennifer plateau with her weight loss and described experiencing fluctuations with her eating habits. [place] was verbally administered Jennifer questionnaire assessing various behaviors related to emotional eating. Jennifer Tyler endorsed the following: overeat when you are celebrating, experience food cravings on Jennifer regular basis, eat certain foods when you are anxious, stressed, depressed, or your feelings are hurt, use food to help you cope with emotional situations, find food is comforting to you, overeat when you are angry or upset, overeat when you are worried about something and eat as Jennifer reward. She shared she craves crunchy foods, such as crackers. Emmabelle is unsure of the onset of emotional eating and described the current frequency of emotional eating as "twice Jennifer week." In addition, Jennifer Tyler denied Jennifer history of binge eating. Jennifer Tyler denied Jennifer history of restricting food intake, purging and engagement in other compensatory strategies, and has never been diagnosed with an eating disorder. She also denied Jennifer history of treatment for emotional eating. Moreover, Jennifer Tyler indicated being upset about different things triggers emotional eating. She is unsure what makes emotional eating better. She further described engaging in grazing behaviors. Furthermore, Jennifer Tyler denied other problems of concern.    Mental Status Examination:  Appearance: neat Behavior: cooperative Mood: euthymic Affect: mood congruent Speech: normal in rate, volume, and tone Eye Contact: appropriate Psychomotor Activity: appropriate Thought Process: linear, logical, and goal directed  Content/Perceptual Disturbances: denies suicidal and homicidal ideation, plan, and intent and no hallucinations, delusions, bizarre thinking or behavior reported or observed Orientation: time, person, place and purpose of appointment Cognition/Sensorium: memory, attention, language, and fund of knowledge intact  Insight: good Judgment: good  Family &  Psychosocial History: Jennifer Tyler reported she is married and she has two adult sons and one adult step-daughter. She indicated she retired in 2018. Additionally, Jennifer Tyler shared her highest level of education obtained is Jennifer high school diploma "and Jennifer couple years at [employer] [facility]." Currently, Sherrilyn's social support system consists  of her husband, children, sisters, neighbors, and friends. Moreover, Perla stated she resides with her husband.   Medical History:  Past Medical History:  Diagnosis Date   Back pain    Gallbladder problem    Hypertension    Swelling    Past Surgical History:  Procedure Laterality Date   ABDOMINAL HYSTERECTOMY     BUNIONECTOMY  2016   R   CHOLECYSTECTOMY     Current Outpatient Medications on File Prior to Visit  Medication Sig Dispense Refill   Ascorbic Acid (VITAMIN C) 100 MG tablet Take 100 mg by mouth daily.     aspirin EC 81 MG tablet Take 81 mg by mouth daily.     hydrochlorothiazide (HYDRODIURIL) 25 MG tablet Take 25 mg by mouth daily.     lisinopril (PRINIVIL,ZESTRIL) 10 MG tablet Take 10 mg by mouth daily.     magnesium gluconate (MAGONATE) 500 MG tablet Take 500 mg by mouth 2 (two) times daily.     Multiple Vitamins-Minerals (MULTIVITAMIN WITH MINERALS) tablet Take 1 tablet by mouth daily.     naproxen sodium (ALEVE) 220 MG tablet Take 220 mg by mouth.     vitamin B-12 (CYANOCOBALAMIN) 250 MCG tablet Take 250 mcg by mouth daily.     Vitamin D, Ergocalciferol, (DRISDOL) 1.25 MG (50000 UT) CAPS capsule Take 1 capsule (50,000 Units total) by mouth every 7 (seven) days. 4 capsule 0   No current facility-administered medications on file prior to visit.   Quintella denied Jennifer history of head injuries and loss of consciousness. She recalled experiencing "transient global amnesia" approximately 10 years ago. Grae believes it was secondary to stress and she received medical attention.   Mental Health History: Malon denied Jennifer history of therapeutic services. Verlee denied Jennifer  history of hospitalizations for psychiatric concerns, and has never met with Jennifer psychiatrist. Darcell stated she has never been prescribed psychotropic medications. Lashuna endorsed Jennifer family history of mental health related concerns. She shared her older sister was diagnosed with schizophrenia. Danise denied Jennifer trauma history, including psychological, physical  and sexual abuse, as well as neglect.   Alexxis described her typical mood as "alright," but described sometimes becoming annoyed. Aside from concerns noted above and endorsed on the PHQ-9 and GAD-7, Tallula reported experiencing decreased motivation and decreased self-esteem due to eating habits and weight. Danean endorsed current alcohol use. She noted she consumes Jennifer glass of wine "once or twice Jennifer month." She denied tobacco use. She denied illicit/recreational substance use. Regarding caffeine intake, Joellyn reported she consumes Jennifer cup of coffee daily. Furthermore, Aritha denied experiencing the following: hopelessness, memory concerns, hallucinations and delusions, paranoia, symptoms of mania (e.g., expansive mood, flighty ideas, decreased need for sleep, engagement in risky behaviors), angry outbursts, social withdrawal, crying spells and panic attacks. She also denied history of and current suicidal ideation, plan, and intent; history of and current homicidal ideation, plan, and intent; and history of and current engagement in self-harm.  The following strengths were reported by Kaeleigh: ability to follow-through with tasks, good wife, good friend, good mother, and caring. The following strengths were observed by this provider: ability to express thoughts and feelings during the therapeutic session, ability to establish and benefit from Jennifer therapeutic relationship, ability to learn and practice coping skills, willingness to work toward established goal(s) with the clinic and ability to engage in reciprocal conversation.  Legal History: Tarren denied Jennifer history of legal  involvement.   Structured Assessment Results: The Patient Health Questionnaire-9 (PHQ-9)  is Jennifer self-report measure that assesses symptoms and severity of depression over the course of the last two weeks. Vira obtained Jennifer score of 8 suggesting mild depression. Reya finds the endorsed symptoms to be not difficult at all. Little interest or pleasure in doing things 2  Feeling down, depressed, or hopeless 0  Trouble falling or staying asleep, or sleeping too much 3  Feeling tired or having little energy 0  Poor appetite or overeating 1  Feeling bad about yourself --- or that you are Jennifer failure or have let yourself or your family down 2  Trouble concentrating on things, such as reading the newspaper or watching television 0  Moving or speaking so slowly that other people could have noticed? Or the opposite --- being so fidgety or restless that you have been moving around Jennifer lot more than usual 0  Thoughts that you would be better off dead or hurting yourself in some way 0  PHQ-9 Score 8    The Generalized Anxiety Disorder-7 (GAD-7) is Jennifer brief self-report measure that assesses symptoms of anxiety over the course of the last two weeks. Tayten obtained Jennifer score of 2 suggesting minimal anxiety. Cayli finds the endorsed symptoms to be not difficult at all. Feeling nervous, anxious, on edge 0  Not being able to stop or control worrying 0  Worrying too much about different things 0  Trouble relaxing 0  Being so restless that it's hard to sit still 0  Becoming easily annoyed or irritable 2  Feeling afraid as if something awful might happen 0  GAD-7 Score 2   Interventions: Jennifer chart review was conducted prior to the clinical intake interview. The PHQ-9, and GAD-7 were verbally administered and Jennifer clinical intake interview was completed. In addition, Harriet was verbally administered Jennifer Mood and Food questionnaire to assess various behaviors related to emotional eating. Throughout session, empathic reflections and  validation was provided. Continuing treatment with this provider was discussed and Jennifer treatment goal was established. Psychoeducation regarding emotional versus physical hunger was provided. Pavneet was given Jennifer handout to utilize between now and the next appointment to increase awareness of hunger patterns and subsequent eating.   Provisional DSM-5 Diagnosis: 311 (F32.8) Other Specified Depressive Disorder, Emotional Eating Behaviors  Plan: Jaki appears able and willing to participate as evidenced by collaboration on Jennifer treatment goal, engagement in reciprocal conversation, and asking questions as needed for clarification. The next appointment will be scheduled in 2-3 weeks. The following treatment goal was established: decrease emotional eating. For the aforementioned goal, Marciel can benefit from individual therapy sessions that are brief in duration for approximately four to six sessions. The treatment modality will be individual therapeutic services, including an eclectic therapeutic approach utilizing techniques from Cognitive Behavioral Therapy, Patient Centered Therapy, Dialectical Behavior Therapy, Acceptance and Commitment Therapy, Interpersonal Therapy, and Cognitive Restructuring. Therapeutic approach will include various interventions as appropriate, such as validation, support, mindfulness, thought defusion, reframing, psychoeducation, values assessment, and role playing. This provider will regularly review the treatment plan and medical chart to keep informed of status changes. Lazara expressed understanding and agreement with the initial treatment plan of care.

## 2019-09-21 ENCOUNTER — Other Ambulatory Visit: Payer: Self-pay

## 2019-09-21 ENCOUNTER — Ambulatory Visit (INDEPENDENT_AMBULATORY_CARE_PROVIDER_SITE_OTHER): Payer: Medicare Other | Admitting: Psychology

## 2019-09-21 ENCOUNTER — Ambulatory Visit (INDEPENDENT_AMBULATORY_CARE_PROVIDER_SITE_OTHER): Payer: Medicare Other | Admitting: Physician Assistant

## 2019-09-21 ENCOUNTER — Encounter (INDEPENDENT_AMBULATORY_CARE_PROVIDER_SITE_OTHER): Payer: Self-pay | Admitting: Physician Assistant

## 2019-09-21 VITALS — BP 127/81 | HR 65 | Temp 98.1°F | Ht 59.0 in | Wt 183.0 lb

## 2019-09-21 DIAGNOSIS — Z6837 Body mass index (BMI) 37.0-37.9, adult: Secondary | ICD-10-CM

## 2019-09-21 DIAGNOSIS — F3289 Other specified depressive episodes: Secondary | ICD-10-CM

## 2019-09-21 DIAGNOSIS — E8881 Metabolic syndrome: Secondary | ICD-10-CM | POA: Diagnosis not present

## 2019-09-21 DIAGNOSIS — E559 Vitamin D deficiency, unspecified: Secondary | ICD-10-CM

## 2019-09-21 MED ORDER — VITAMIN D (ERGOCALCIFEROL) 1.25 MG (50000 UNIT) PO CAPS: 50000.0000 [IU] | ORAL_CAPSULE | ORAL | 0 refills | Status: DC

## 2019-09-21 MED ORDER — BUPROPION HCL ER (SR) 150 MG PO TB12: 150.0000 mg | ORAL_TABLET | Freq: Every day | ORAL | 0 refills | Status: DC

## 2019-09-22 NOTE — Progress Notes (Signed)
Office: [phone]  /  Fax: [phone]   HPI:   Chief Complaint: OBESITY Jennifer Tyler is here to discuss her progress with her obesity treatment plan. She is on the Jennifer Tyler eating plan and is following her eating plan approximately 50 % of the time. She states she is walking 20 minutes 2 times per week. Jennifer Tyler reports that she is overeating her snack calories, due to carbohydrate cravings. She is not eating all of the food on the plan. Her weight is 183 lb (83 kg) today and she has maintained weight since her last visit. She has lost 10 lbs since starting treatment with [name].  Insulin Resistance Jennifer Tyler has a diagnosis of insulin resistance based on her elevated fasting insulin level >5. Although Jennifer Tyler's blood glucose readings are still under good control, insulin resistance puts her at greater risk of metabolic syndrome and diabetes. She is not on medications currently. Jennifer Tyler denies polyphagia, but she reports cravings. Jennifer Tyler continues to work on diet and exercise to decrease risk of diabetes.  Vitamin D deficiency Jennifer Tyler has a diagnosis of vitamin D deficiency. Jennifer Tyler is currently taking vit D and she denies nausea, vomiting or muscle weakness. Jennifer Tyler has no excessive fatigue.  Depression with emotional eating behaviors Jennifer Tyler reports cravings for carbohydrates. She is struggling with emotional eating and using food for comfort to the extent that it is negatively impacting her health. She often snacks when she is not hungry. Jennifer Tyler sometimes feels she is out of control and then feels guilty that she made poor food choices. She has been working on behavior modification techniques to help reduce her emotional eating and has been somewhat successful. Her blood pressure is normal. She shows no sign of suicidal or homicidal ideations.  ASSESSMENT AND PLAN:  Vitamin D deficiency - Plan: Vitamin D, Ergocalciferol, (DRISDOL) 1.25 MG (50000 UT) CAPS capsule  Insulin resistance  Other depression - with emotional  eating - Plan: buPROPion (WELLBUTRIN SR) 150 MG 12 hr tablet  Class 2 severe obesity with serious comorbidity and body mass index (BMI) of 37.0 to 37.9 in adult, unspecified obesity type (Jennifer Tyler)  PLAN:  Insulin Resistance Jennifer Tyler will continue to work on weight loss, exercise, and decreasing simple carbohydrates in her diet to help decrease the risk of diabetes. She was informed that eating too many simple carbohydrates or too many calories at one sitting increases the likelihood of GI side effects. Jennifer Tyler agreed to follow up with [name] as directed to monitor her progress.  Vitamin D Deficiency Jennifer Tyler was informed that low vitamin D levels contributes to fatigue and are associated with obesity, breast, and colon cancer. Jennifer Tyler agrees to continue to take prescription Vit D @50 ,000 IU every week #4 with no refills and she will follow up for routine testing of vitamin D, at least 2-3 times per year. She was informed of the risk of over-replacement of vitamin D and agrees to not increase her dose unless she discusses this with [name] first. Jennifer Tyler agrees to follow up with our clinic in 3 weeks.  Depression with Emotional Eating Behaviors We discussed behavior modification techniques today to help Jennifer Tyler deal with her emotional eating and depression. She has agreed to start Wellbutrin SR 150 mg qAM #30 with no refills and follow up as directed.  Obesity Jennifer Tyler is currently in the action stage of change. As such, her goal is to continue with weight loss efforts She has agreed to follow the Jennifer Tyler eating plan Jennifer Tyler has been instructed to work up to a  goal of 150 minutes of combined cardio and strengthening exercise per week for weight loss and overall health benefits. We discussed the following Behavioral Modification Strategies today: better snacking choices and ways to avoid night time snacking  Jennifer Tyler has agreed to follow up with our clinic in 3 weeks. She was informed of the importance of frequent follow up visits to  maximize her success with intensive lifestyle modifications for her multiple health conditions.  ALLERGIES: No Known Allergies  MEDICATIONS: Current Outpatient Medications on File Prior to Visit  Medication Sig Dispense Refill   Ascorbic Acid (VITAMIN C) 100 MG tablet Take 100 mg by mouth daily.     aspirin EC 81 MG tablet Take 81 mg by mouth daily.     hydrochlorothiazide (HYDRODIURIL) 25 MG tablet Take 25 mg by mouth daily.     lisinopril (PRINIVIL,ZESTRIL) 10 MG tablet Take 10 mg by mouth daily.     magnesium gluconate (MAGONATE) 500 MG tablet Take 500 mg by mouth 2 (two) times daily.     Multiple Vitamins-Minerals (MULTIVITAMIN WITH MINERALS) tablet Take 1 tablet by mouth daily.     naproxen sodium (ALEVE) 220 MG tablet Take 220 mg by mouth.     vitamin B-12 (CYANOCOBALAMIN) 250 MCG tablet Take 250 mcg by mouth daily.     No current facility-administered medications on file prior to visit.     PAST MEDICAL HISTORY: Past Medical History:  Diagnosis Date   Back pain    Gallbladder problem    Hypertension    Swelling     PAST SURGICAL HISTORY: Past Surgical History:  Procedure Laterality Date   ABDOMINAL HYSTERECTOMY     BUNIONECTOMY  2016   R   CHOLECYSTECTOMY      SOCIAL HISTORY: Social History   Tobacco Use   Smoking status: Never Smoker   Smokeless tobacco: Never Used  Substance Use Topics   Alcohol use: Not Currently   Drug use: Never    FAMILY HISTORY: Family History  Problem Relation Age of Onset   Heart disease Mother    Breast cancer [name]     ROS: Review of Systems  Constitutional: Negative for malaise/fatigue and weight loss.  Gastrointestinal: Negative for nausea and vomiting.  Musculoskeletal:       Negative for muscle weakness  Endo/Heme/Allergies:       Positive for cravings Negative for polyphagia  Psychiatric/Behavioral: Positive for depression. Negative for suicidal ideas.    PHYSICAL EXAM: Blood pressure  127/81, pulse 65, temperature 98.1 F (36.7 C), temperature source Oral, height 4\' 11"  (1.499 m), weight 183 lb (83 kg), SpO2 99 %. Body mass index is 36.96 kg/m. Physical Exam Vitals signs reviewed.  Constitutional:      Appearance: Normal appearance. She is well-developed. She is obese.  Cardiovascular:     Rate and Rhythm: Normal rate.  Pulmonary:     Effort: Pulmonary effort is normal.  Musculoskeletal: Normal range of motion.  Skin:    General: Skin is warm and dry.  Neurological:     Mental Status: She is alert and oriented to person, place, and time.  Psychiatric:        Mood and Affect: Mood normal.        Behavior: Behavior normal.        Thought Content: Thought content does not include homicidal or suicidal ideation.     RECENT LABS AND TESTS: BMET    Component Value Date/Time   NA 139 06/08/2019 1118   K  4.2 06/08/2019 1118   CL 98 06/08/2019 1118   CO2 27 06/08/2019 1118   GLUCOSE 75 06/08/2019 1118   BUN 13 06/08/2019 1118   CREATININE 0.51 (L) 06/08/2019 1118   CALCIUM 9.9 06/08/2019 1118   GFRNONAA 100 06/08/2019 1118   GFRAA 115 06/08/2019 1118   Lab Results  Component Value Date   HGBA1C 5.1 06/08/2019   HGBA1C 5.1 12/24/2018   Lab Results  Component Value Date   INSULIN 8.0 06/08/2019   INSULIN 11.9 12/24/2018   CBC    Component Value Date/Time   WBC 4.6 12/24/2018 1456   RBC 5.02 12/24/2018 1456   HGB 14.2 12/24/2018 1456   HCT 42.5 12/24/2018 1456   PLT 271 12/24/2018 1456   MCV 85 12/24/2018 1456   MCH 28.3 12/24/2018 1456   MCHC 33.4 12/24/2018 1456   RDW 12.7 12/24/2018 1456   LYMPHSABS 1.5 12/24/2018 1456   EOSABS 0.0 12/24/2018 1456   BASOSABS 0.0 12/24/2018 1456   Iron/TIBC/Ferritin/ %Sat No results found for: IRON, TIBC, FERRITIN, IRONPCTSAT Lipid Panel  No results found for: CHOL, TRIG, HDL, CHOLHDL, VLDL, LDLCALC, LDLDIRECT Hepatic Function Panel     Component Value Date/Time   PROT 6.8 06/08/2019 1118   ALBUMIN  4.3 06/08/2019 1118   AST 18 06/08/2019 1118   ALT 13 06/08/2019 1118   ALKPHOS 82 06/08/2019 1118   BILITOT 0.3 06/08/2019 1118      Component Value Date/Time   TSH 0.461 12/24/2018 1456     Ref. Range 06/08/2019 11:18  Vitamin D, 25-Hydroxy Latest Ref Range: 30.0 - 100.0 ng/mL 37.4    OBESITY BEHAVIORAL INTERVENTION VISIT  Today's visit was # 16   Starting weight: 193 lbs Starting date: 12/24/2018 Today's weight : 183 lbs  Today's date: 09/21/2019 Total lbs lost to date: 10    09/21/2019  Height 4\' 11"  (1.499 m)  Weight 183 lb (83 kg)  BMI (Calculated) 36.94  BLOOD PRESSURE - SYSTOLIC [id number]  BLOOD PRESSURE - DIASTOLIC 81   Body Fat % [id number] %  Total Body Water (lbs) 66.2 lbs    ASK: We discussed the diagnosis of obesity with Jennifer Tyler today and Jennifer Tyler agreed to give [name] permission to discuss obesity behavioral modification therapy today.  ASSESS: Jennifer Tyler has the diagnosis of obesity and her BMI today is 36.94 Jennifer Tyler is in the action stage of change   ADVISE: Jennifer Tyler was educated on the multiple health risks of obesity as well as the benefit of weight loss to improve her health. She was advised of the need for long term treatment and the importance of lifestyle modifications to improve her current health and to decrease her risk of future health problems.  AGREE: Multiple dietary modification options and treatment options were discussed and  Jennifer Tyler agreed to follow the recommendations documented in the above note.  ARRANGE: Jennifer Tyler was educated on the importance of frequent visits to treat obesity as outlined per CMS and USPSTF guidelines and agreed to schedule her next follow up appointment today.  Corey Skains, am acting as transcriptionist for [name], PA-C I, [name], PA-C have reviewed above note and agree with its content

## 2019-09-27 NOTE — Progress Notes (Unsigned)
  Office: [phone]  /  Fax: [phone]    Date: October 07, 2019   Time Seen:*** Duration: *** minutes Provider: [name], Psy.D. Type of Session: Individual Therapy  Type of Contact: Face-to-face  Session Content: Jennifer Tyler is a 68 y.o. female presenting for a follow-up appointment to address the previously established treatment goal of decreasing emotional eating. The session was initiated with the administration of the PHQ-9 and GAD-7, as well as a brief check-in. *** [name] was receptive to today's session as evidenced by openness to sharing, responsiveness to feedback, and ***.  Mental Status Examination:  Appearance: {Appearance:22431} Behavior: {Behavior:22445} Mood: {gbmood:21757} Affect: {Affect:22436} Speech: {Speech:22432} Eye Contact: {Eye Contact:22433} Psychomotor Activity: {Motor Activity:22434} Thought Process: {thought process:22448}  Content/Perceptual Disturbances: {disturbances:22451} Orientation: {Orientation:22437} Cognition/Sensorium: {gbcognition:22449} Insight: {Insight:22446} Judgment: {Insight:22446}  Structured Assessment Results: The Patient Health Questionnaire-9 (PHQ-9) is a self-report measure that assesses symptoms and severity of depression over the course of the last two weeks. [name] obtained a score of *** suggesting {GBPHQ9SEVERITY:21752}. [name] finds the endorsed symptoms to be {gbphq9difficulty:21754}. Little interest or pleasure in doing things ***  Feeling down, depressed, or hopeless ***  Trouble falling or staying asleep, or sleeping too much ***  Feeling tired or having little energy ***  Poor appetite or overeating ***  Feeling bad about yourself --- or that you are a failure or have let yourself or your family down ***  Trouble concentrating on things, such as reading the newspaper or watching television ***  Moving or speaking so slowly that other people could have noticed? Or the opposite --- being so fidgety or restless that you  have been moving around a lot more than usual ***  Thoughts that you would be better off dead or hurting yourself in some way ***  PHQ-9 Score ***    The Generalized Anxiety Disorder-7 (GAD-7) is a brief self-report measure that assesses symptoms of anxiety over the course of the last two weeks. [name] obtained a score of *** suggesting {gbgad7severity:21753}. [name] finds the endorsed symptoms to be {gbphq9difficulty:21754}. Feeling nervous, anxious, on edge ***  Not being able to stop or control worrying ***  Worrying too much about different things ***  Trouble relaxing ***  Being so restless that it's hard to sit still ***  Becoming easily annoyed or irritable ***  Feeling afraid as if something awful might happen ***  GAD-7 Score ***   Interventions:  {Interventions:22172}  DSM-5 Diagnosis: 311 (F32.8) Other Specified Depressive Disorder, Emotional Eating Behaviors  Treatment Goal & Progress: During the initial appointment with this provider, the following treatment goal was established: decrease emotional eating. [name] has demonstrated progress in her goal as evidenced by {gbtxprogress:22839}. [name] also reported {gbtxprogress2:22951}.  Plan: [name] continues to appear able and willing to participate as evidenced by engagement in reciprocal conversation, and asking questions for clarification as appropriate.*** The next appointment will be scheduled in {gbweeks:21758}. The next session will focus on reviewing learned skills, and working towards the established treatment goal.***

## 2019-10-07 ENCOUNTER — Ambulatory Visit (INDEPENDENT_AMBULATORY_CARE_PROVIDER_SITE_OTHER): Payer: Medicare Other | Admitting: Psychology

## 2019-10-07 NOTE — Progress Notes (Signed)
Office: [phone]  /  Fax: [phone]    Date: October 12, 2019   Time Seen:11:30am Duration: 24 minutes Provider: [name], Psy.D. Type of Session: Individual Therapy  Type of Contact: Face-to-face  Session Content: Jennifer Tyler is a 68 y.o. female presenting for a follow-up appointment to address the previously established treatment goal of decreasing emotional eating. The session was initiated with the administration of the PHQ-9 and GAD-7, as well as a brief check-in. Catalena shared about Thanksgiving and noted, "We didn't over do it." She reported she is "doing pretty good" with the structured meal plan, but acknowledged she does not always consume the noted protein amount. It was recommended she discuss the aforementioned further with Dr. Leafy Ro during their next appointment (10/25/2019); she agreed. Psychoeducation regarding triggers for emotional eating was provided. [place] was provided a handout, and encouraged to utilize the handout between now and the next appointment to increase awareness of triggers and frequency. Panayiota agreed. This provider also discussed behavioral strategies for specific triggers, such as placing the utensil down when conversing to avoid mindless eating. Khaliyah was receptive to today's appointment as evidenced by openness to sharing, responsiveness to feedback, and willingness to explore triggers for emotional eating.  Mental Status Examination:  Appearance: neat Behavior: appropriate to circumstances Mood: euthymic Affect: mood congruent Speech: normal in rate, volume, and tone Eye Contact: appropriate Psychomotor Activity: appropriate Gait: normal Thought Process: linear, logical, and goal directed  Thought Content/Perception: no hallucinations, delusions, bizarre thinking or behavior reported or observed and no evidence of suicidal and homicidal ideation, plan, and intent Orientation: time, person, place and purpose of appointment Memory/Concentration:  memory, attention, language, and fund of knowledge intact  Insight/Judgment: good  Structured Assessments Results: The Patient Health Questionnaire-9 (PHQ-9) is a self-report measure that assesses symptoms and severity of depression over the course of the last two weeks. Seher obtained a score of 2 suggesting minimal depression. Alia finds the endorsed symptoms to be not difficult at all. [0= Not at all; 1= Several days; 2= More than half the days; 3= Nearly every day] Little interest or pleasure in doing things 0  Feeling down, depressed, or hopeless 1  Trouble falling or staying asleep, or sleeping too much 1  Feeling tired or having little energy 0  Poor appetite or overeating 0  Feeling bad about yourself --- or that you are a failure or have let yourself or your family down 0  Trouble concentrating on things, such as reading the newspaper or watching television 0  Moving or speaking so slowly that other people could have noticed? Or the opposite --- being so fidgety or restless that you have been moving around a lot more than usual 0  Thoughts that you would be better off dead or hurting yourself in some way 0  PHQ-9 Score 2    The Generalized Anxiety Disorder-7 (GAD-7) is a brief self-report measure that assesses symptoms of anxiety over the course of the last two weeks. Quanika obtained a score of 2 suggesting minimal anxiety. Shanele finds the endorsed symptoms to be not difficult at all. [0= Not at all; 1= Several days; 2= Over half the days; 3= Nearly every day] Feeling nervous, anxious, on edge 0  Not being able to stop or control worrying 0  Worrying too much about different things 0  Trouble relaxing 0  Being so restless that it's hard to sit still 0  Becoming easily annoyed or irritable 2  Feeling afraid as if something awful might  happen 0  GAD-7 Score 2   Interventions:  Conducted a brief chart review Verbally administered PHQ-9 and GAD-7 for symptom monitoring Provided  empathic reflections and validation Reviewed content from the previous session Employed supportive psychotherapy interventions to facilitate reduced distress and to improve coping skills with identified stressors Employed motivational interviewing skills to assess patient's willingness/desire to adhere to recommended medical treatments and assignments Psychoeducation provided regarding triggers for emotional eating  DSM-5 Diagnosis: 311 (F32.8) Other Specified Depressive Disorder, Emotional Eating Behaviors  Treatment Goal & Progress: During the initial appointment with this provider, the following treatment goal was established: decrease emotional eating. Progress is limited, as [name] has just begun treatment with this provider; however, she is receptive to the interaction and interventions and rapport is being established.   Plan: Due to the upcoming holidays and this provider being out of the office in the coming weeks, the next appointment will be scheduled in approximately one month. The next session will focus on working towards the established treatment goal.

## 2019-10-12 ENCOUNTER — Other Ambulatory Visit: Payer: Self-pay

## 2019-10-12 ENCOUNTER — Ambulatory Visit (INDEPENDENT_AMBULATORY_CARE_PROVIDER_SITE_OTHER): Payer: Medicare Other | Admitting: Family Medicine

## 2019-10-12 ENCOUNTER — Encounter (INDEPENDENT_AMBULATORY_CARE_PROVIDER_SITE_OTHER): Payer: Self-pay | Admitting: Family Medicine

## 2019-10-12 ENCOUNTER — Ambulatory Visit (INDEPENDENT_AMBULATORY_CARE_PROVIDER_SITE_OTHER): Payer: Medicare Other | Admitting: Psychology

## 2019-10-12 VITALS — BP 134/72 | HR 65 | Temp 98.3°F | Ht 59.0 in | Wt 179.0 lb

## 2019-10-12 DIAGNOSIS — Z6836 Body mass index (BMI) 36.0-36.9, adult: Secondary | ICD-10-CM

## 2019-10-12 DIAGNOSIS — E559 Vitamin D deficiency, unspecified: Secondary | ICD-10-CM

## 2019-10-12 DIAGNOSIS — F3289 Other specified depressive episodes: Secondary | ICD-10-CM

## 2019-10-12 MED ORDER — BUPROPION HCL ER (SR) 150 MG PO TB12: 150.0000 mg | ORAL_TABLET | Freq: Every day | ORAL | 0 refills | Status: DC

## 2019-10-12 MED ORDER — VITAMIN D (ERGOCALCIFEROL) 1.25 MG (50000 UNIT) PO CAPS: 50000.0000 [IU] | ORAL_CAPSULE | ORAL | 0 refills | Status: DC

## 2019-10-12 NOTE — Progress Notes (Signed)
Office: [phone]  /  Fax: [phone]   HPI:   Chief Complaint: OBESITY Jennifer Tyler is here to discuss her progress with her obesity treatment plan. She is on the Pescatarian eating plan and is following her eating plan approximately 80% of the time. She states she is walking 1 mile 30 minutes 7 times per week. Jennifer Tyler continues to do well with weight loss on her plan. Hunger is controlled and she is meal planning well. She sometimes doesn't eat all of her protein. Her weight is 179 lb (81.2 kg) today and has had a weight loss of 4 pounds over a period of 3 weeks since her last visit. She has lost 14 lbs since starting treatment with [name].  Depression with emotional eating behaviors Jennifer Tyler is struggling with emotional eating and using food for comfort to the extent that it is negatively impacting her health. She often snacks when she is not hungry. Jennifer Tyler sometimes feels she is out of control and then feels guilty that she made poor food choices. She has been working on behavior modification techniques to help reduce her emotional eating and has been somewhat successful. Jennifer Tyler started Wellbutrin and she feels this has helped with cravings and emotional eating. Blood pressure is stable. She shows no sign of suicidal or homicidal ideations.  Depression screen [facility] 2/9 12/24/2018  Decreased Interest 1  Down, Depressed, Hopeless 0  PHQ - 2 Score 1  Altered sleeping 1  Tired, decreased energy 1  Change in appetite 1  Feeling bad or failure about yourself  1  Trouble concentrating 1  Moving slowly or fidgety/restless 0  Suicidal thoughts 0  PHQ-9 Score 6  Difficult doing work/chores Not difficult at all   Vitamin D deficiency Jennifer Tyler has a diagnosis of Vitamin D deficiency, which is not yet at goal. She is currently stable on prescription Vit D and denies nausea, vomiting or muscle weakness.  ASSESSMENT AND PLAN:  Vitamin D deficiency - Plan: Vitamin D, Ergocalciferol, (DRISDOL) 1.25 MG (50000 UT) CAPS  capsule  Other depression - with emotional eating - Plan: buPROPion (WELLBUTRIN SR) 150 MG 12 hr tablet  Class 2 severe obesity with serious comorbidity and body mass index (BMI) of 36.0 to 36.9 in adult, unspecified obesity type ([place])  PLAN:  Emotional Eating Behaviors (other depression) Behavior modification techniques were discussed today to help Jennifer Tyler deal with her emotional/non-hunger eating behaviors. Jennifer Tyler was given a refill on her Wellbutrin 150 mg #30 with 0 refills and agrees to follow-up with our clinic in 2 weeks.  Vitamin D Deficiency Jennifer Tyler was informed that low Vitamin D levels contributes to fatigue and are associated with obesity, breast, and colon cancer. She agrees to continue to take prescription Vit D @ 50,000 IU every week #4 with 0 refills and will follow-up for routine testing of Vitamin D, at least 2-3 times per year. She was informed of the risk of over-replacement of Vitamin D and agrees to not increase her dose unless she discusses this with [name] first. Jennifer Tyler agrees to follow-up with our clinic in 2 weeks.  Obesity Jennifer Tyler is currently in the action stage of change. As such, her goal is to continue with weight loss efforts. She has agreed to follow the Pescatarian eating plan. Jennifer Tyler has been instructed to work up to a goal of 150 minutes of combined cardio and strengthening exercise per week for weight loss and overall health benefits. We discussed the following Behavioral Modification Strategies today: increasing lean protein intake.  Jennifer Tyler has agreed to follow-up with our clinic in 2 weeks. She was informed of the importance of frequent follow-up visits to maximize her success with intensive lifestyle modifications for her multiple health conditions.  ALLERGIES: No Known Allergies  MEDICATIONS: Current Outpatient Medications on File Prior to Visit  Medication Sig Dispense Refill  . Ascorbic Acid (VITAMIN C) 100 MG tablet Take 100 mg by mouth daily.    [name] aspirin EC 81  MG tablet Take 81 mg by mouth daily.    . hydrochlorothiazide (HYDRODIURIL) 25 MG tablet Take 25 mg by mouth daily.    [name] lisinopril (PRINIVIL,ZESTRIL) 10 MG tablet Take 10 mg by mouth daily.    . magnesium gluconate (MAGONATE) 500 MG tablet Take 500 mg by mouth 2 (two) times daily.    . Multiple Vitamins-Minerals (MULTIVITAMIN WITH MINERALS) tablet Take 1 tablet by mouth daily.    . naproxen sodium (ALEVE) 220 MG tablet Take 220 mg by mouth.    . vitamin B-12 (CYANOCOBALAMIN) 250 MCG tablet Take 250 mcg by mouth daily.     No current facility-administered medications on file prior to visit.     PAST MEDICAL HISTORY: Past Medical History:  Diagnosis Date  . Back pain   . Gallbladder problem   . Hypertension   . Swelling     PAST SURGICAL HISTORY: Past Surgical History:  Procedure Laterality Date  . ABDOMINAL HYSTERECTOMY    . BUNIONECTOMY  2016   R  . CHOLECYSTECTOMY      SOCIAL HISTORY: Social History   Tobacco Use  . Smoking status: Never Smoker  . Smokeless tobacco: Never Used  Substance Use Topics  . Alcohol use: Not Currently  . Drug use: Never    FAMILY HISTORY: Family History  Problem Relation Age of Onset  . Heart disease Mother   . Breast cancer [name]    ROS: Review of Systems  Gastrointestinal: Negative for nausea and vomiting.  Musculoskeletal:       Negative for muscle weakness.  Psychiatric/Behavioral: Positive for depression (emotional eating). Negative for suicidal ideas.       Negative for homicidal ideas.   PHYSICAL EXAM: Blood pressure 134/72, pulse 65, temperature 98.3 F (36.8 C), temperature source Oral, height 4\' 11"  (1.499 m), weight 179 lb (81.2 kg), SpO2 94 %. Body mass index is 36.15 kg/m. Physical Exam Vitals signs reviewed.  Constitutional:      Appearance: Normal appearance. She is obese.  Cardiovascular:     Rate and Rhythm: Normal rate.     Pulses: Normal pulses.  Pulmonary:     Effort: Pulmonary effort is normal.      Breath sounds: Normal breath sounds.  Musculoskeletal: Normal range of motion.  Skin:    General: Skin is warm and dry.  Neurological:     Mental Status: She is alert and oriented to person, place, and time.  Psychiatric:        Behavior: Behavior normal.   RECENT LABS AND TESTS: BMET    Component Value Date/Time   NA 139 06/08/2019 1118   K 4.2 06/08/2019 1118   CL 98 06/08/2019 1118   CO2 27 06/08/2019 1118   GLUCOSE 75 06/08/2019 1118   BUN 13 06/08/2019 1118   CREATININE 0.51 (L) 06/08/2019 1118   CALCIUM 9.9 06/08/2019 1118   GFRNONAA 100 06/08/2019 1118   GFRAA 115 06/08/2019 1118   Lab Results  Component Value Date   HGBA1C 5.1 06/08/2019   HGBA1C 5.1 12/24/2018  Lab Results  Component Value Date   INSULIN 8.0 06/08/2019   INSULIN 11.9 12/24/2018   CBC    Component Value Date/Time   WBC 4.6 12/24/2018 1456   RBC 5.02 12/24/2018 1456   HGB 14.2 12/24/2018 1456   HCT 42.5 12/24/2018 1456   PLT 271 12/24/2018 1456   MCV 85 12/24/2018 1456   MCH 28.3 12/24/2018 1456   MCHC 33.4 12/24/2018 1456   RDW 12.7 12/24/2018 1456   LYMPHSABS 1.5 12/24/2018 1456   EOSABS 0.0 12/24/2018 1456   BASOSABS 0.0 12/24/2018 1456   Iron/TIBC/Ferritin/ %Sat No results found for: IRON, TIBC, FERRITIN, IRONPCTSAT Lipid Panel  No results found for: CHOL, TRIG, HDL, CHOLHDL, VLDL, LDLCALC, LDLDIRECT Hepatic Function Panel     Component Value Date/Time   PROT 6.8 06/08/2019 1118   ALBUMIN 4.3 06/08/2019 1118   AST 18 06/08/2019 1118   ALT 13 06/08/2019 1118   ALKPHOS 82 06/08/2019 1118   BILITOT 0.3 06/08/2019 1118      Component Value Date/Time   TSH 0.461 12/24/2018 1456   Results for YOSHA, MELARA (MRN [id number]) as of 10/12/2019 11:53  Ref. Range 06/08/2019 11:18  Vitamin D, 25-Hydroxy Latest Ref Range: 30.0 - 100.0 ng/mL 37.4   OBESITY BEHAVIORAL INTERVENTION VISIT  Today's visit was #17  Starting weight: 193 lbs Starting date: 12/24/2018 Today's weight: 179  lbs  Today's date: 10/12/2019 Total lbs lost to date: 14  At least 15 minutes were spent on discussing the following behavioral intervention visit.    10/12/2019  Height 4\' 11"  (1.499 m)  Weight 179 lb (81.2 kg)  BMI (Calculated) 36.13  BLOOD PRESSURE - SYSTOLIC [id number]  BLOOD PRESSURE - DIASTOLIC 72   Body Fat % 123456 %  Total Body Water (lbs) 63.6 lbs   ASK: We discussed the diagnosis of obesity with Jennifer Tyler today and Jennifer Tyler agreed to give [name] permission to discuss obesity behavioral modification therapy today.  ASSESS: Jennifer Tyler has the diagnosis of obesity and her BMI today is 36.3. Jennifer Tyler is in the action stage of change.   ADVISE: Jennifer Tyler was educated on the multiple health risks of obesity as well as the benefit of weight loss to improve her health. She was advised of the need for long term treatment and the importance of lifestyle modifications to improve her current health and to decrease her risk of future health problems.  AGREE: Multiple dietary modification options and treatment options were discussed and  Jennifer Tyler agreed to follow the recommendations documented in the above note.  ARRANGE: Aveline was educated on the importance of frequent visits to treat obesity as outlined per CMS and USPSTF guidelines and agreed to schedule her next follow up appointment today.  I, Michaelene Song, am acting as Location manager for Dennard Nip, MD I have reviewed the above documentation for accuracy and completeness, and I agree with the above. -Dennard Nip, MD

## 2019-10-25 ENCOUNTER — Ambulatory Visit (INDEPENDENT_AMBULATORY_CARE_PROVIDER_SITE_OTHER): Payer: Medicare Other | Admitting: Family Medicine

## 2019-11-04 ENCOUNTER — Other Ambulatory Visit: Payer: Self-pay

## 2019-11-04 DIAGNOSIS — Z20822 Contact with and (suspected) exposure to covid-19: Secondary | ICD-10-CM

## 2019-11-06 LAB — NOVEL CORONAVIRUS, NAA: SARS-CoV-2, NAA: NOT DETECTED

## 2019-11-08 NOTE — Progress Notes (Signed)
Office: [phone]  /  Fax: [phone]    Date: November 11, 2019   Time Seen: 2:07pm Duration: 20 minutes Provider: [name], Psy.D. Type of Session: Individual Therapy  Type of Contact: Face-to-face  Session Content: Quadirah is a 69 y.o. female presenting for a follow-up appointment to address the previously established treatment goal of decreasing emotional eating. The session was initiated with the administration of the PHQ-9 and GAD-7, as well as a brief check-in. Senaida shared, "Everything is fine." She reported she deviated from the meal plan during the holidays, but described making better choices and engaging in portion control. She also reported a reduction in emotional eating. Positive reinforcement was provided. Regarding sleep, Merilyn reported feeling "frustrated" when she is unable to stay asleep. She explained she is experiencing worry thoughts about a property she is trying to sell, which impacts her sleep. Thus, today's appointment focused on a grounding technique (5-4-3-2-1) to assist with coping. She was led through the exercise and her experience was processed. She was provided a handout for the exercise. Moreover, termination planning was discussed. Anari was receptive to a follow-up appointment in 3-4 weeks and an additional follow-up/termination appointment in 3-4 weeks after that. Overall,  Chardai was receptive to today's appointment as evidenced by openness to sharing, responsiveness to feedback, and willingness to engage in the grounding exercise.  Mental Status Examination:  Appearance: well groomed and appropriate hygiene  Behavior: appropriate to circumstances Mood: euthymic Affect: mood congruent Speech: normal in rate, volume, and tone Eye Contact: appropriate Psychomotor Activity: appropriate Gait: normal Thought Process: linear, logical, and goal directed  Thought Content/Perception: no hallucinations, delusions, bizarre thinking or behavior reported or observed  and no evidence of suicidal and homicidal ideation, plan, and intent Orientation: time, person, place and purpose of appointment Memory/Concentration: memory, attention, language, and fund of knowledge intact  Insight/Judgment: good  Structured Assessments Results: The Patient Health Questionnaire-9 (PHQ-9) is a self-report measure that assesses symptoms and severity of depression over the course of the last two weeks. Dema obtained a score of 3 suggesting minimal depression. Toye finds the endorsed symptoms to be somewhat difficult. [0= Not at all; 1= Several days; 2= More than half the days; 3= Nearly every day] Little interest or pleasure in doing things 0  Feeling down, depressed, or hopeless 0  Trouble falling or staying asleep, or sleeping too much 3  Feeling tired or having little energy 0  Poor appetite or overeating 0  Feeling bad about yourself --- or that you are a failure or have let yourself or your family down 0  Trouble concentrating on things, such as reading the newspaper or watching television 0  Moving or speaking so slowly that other people could have noticed? Or the opposite --- being so fidgety or restless that you have been moving around a lot more than usual 0  Thoughts that you would be better off dead or hurting yourself in some way 0  PHQ-9 Score 3    The Generalized Anxiety Disorder-7 (GAD-7) is a brief self-report measure that assesses symptoms of anxiety over the course of the last two weeks. Mechele obtained a score of 6 suggesting mild anxiety. Tayzlee finds the endorsed symptoms to be not difficult at all. [0= Not at all; 1= Several days; 2= Over half the days; 3= Nearly every day] Feeling nervous, anxious, on edge 2  Not being able to stop or control worrying 0  Worrying too much about different things 0  Trouble relaxing 0  Being so restless that it's hard to sit still 0  Becoming easily annoyed or irritable 2  Feeling afraid as if something awful might happen 2   GAD-7 Score 6   Interventions:  Conducted a brief chart review Verbally administered PHQ-9 and GAD-7 for symptom monitoring Provided empathic reflections and validation Employed supportive psychotherapy interventions to facilitate reduced distress and to improve coping skills with identified stressors Employed motivational interviewing skills to assess patient's willingness/desire to adhere to recommended medical treatments and assignments Psychoeducation regarding grounding  Engaged patient in a grounding exercise Discussed termination planning  DSM-5 Diagnosis: 311 (F32.8) Other Specified Depressive Disorder, Emotional Eating Behaviors  Treatment Goal & Progress: During the initial appointment with this provider, the following treatment goal was established: decrease emotional eating. Bobi has demonstrated progress in her goal as evidenced by increased awareness of hunger patterns, increased awareness of triggers for emotional eating and reduction in emotional eating. Noelani also continues to demonstrate willingness to engage in learned skill(s).  Plan: The next appointment will be scheduled in three weeks. The next session will focus on working towards the established treatment goal.

## 2019-11-11 ENCOUNTER — Other Ambulatory Visit: Payer: Self-pay

## 2019-11-11 ENCOUNTER — Ambulatory Visit (INDEPENDENT_AMBULATORY_CARE_PROVIDER_SITE_OTHER): Payer: Medicare Other | Admitting: Psychology

## 2019-11-11 ENCOUNTER — Ambulatory Visit (INDEPENDENT_AMBULATORY_CARE_PROVIDER_SITE_OTHER): Payer: Medicare Other | Admitting: Family Medicine

## 2019-11-11 VITALS — BP 144/73 | HR 72 | Temp 97.6°F | Ht 59.0 in | Wt 173.0 lb

## 2019-11-11 DIAGNOSIS — F3289 Other specified depressive episodes: Secondary | ICD-10-CM | POA: Diagnosis not present

## 2019-11-11 DIAGNOSIS — E559 Vitamin D deficiency, unspecified: Secondary | ICD-10-CM | POA: Diagnosis not present

## 2019-11-11 DIAGNOSIS — Z6835 Body mass index (BMI) 35.0-35.9, adult: Secondary | ICD-10-CM

## 2019-11-11 DIAGNOSIS — E538 Deficiency of other specified B group vitamins: Secondary | ICD-10-CM | POA: Diagnosis not present

## 2019-11-11 DIAGNOSIS — R5383 Other fatigue: Secondary | ICD-10-CM

## 2019-11-11 DIAGNOSIS — R739 Hyperglycemia, unspecified: Secondary | ICD-10-CM

## 2019-11-11 DIAGNOSIS — I10 Essential (primary) hypertension: Secondary | ICD-10-CM

## 2019-11-11 MED ORDER — VITAMIN D (ERGOCALCIFEROL) 1.25 MG (50000 UNIT) PO CAPS: 50000.0000 [IU] | ORAL_CAPSULE | ORAL | 0 refills | Status: DC

## 2019-11-12 LAB — COMPREHENSIVE METABOLIC PANEL
ALT: 18 IU/L (ref 0–32)
AST: 19 IU/L (ref 0–40)
Albumin/Globulin Ratio: 1.9 (ref 1.2–2.2)
Albumin: 4.7 g/dL (ref 3.8–4.8)
Alkaline Phosphatase: 90 IU/L (ref 39–117)
BUN/Creatinine Ratio: 13 (ref 12–28)
BUN: 7 mg/dL — ABNORMAL LOW (ref 8–27)
Bilirubin Total: 0.5 mg/dL (ref 0.0–1.2)
CO2: 27 mmol/L (ref 20–29)
Calcium: 10.3 mg/dL (ref 8.7–10.3)
Chloride: 95 mmol/L — ABNORMAL LOW (ref 96–106)
Creatinine, Ser: 0.55 mg/dL — ABNORMAL LOW (ref 0.57–1.00)
GFR calc Af Amer: 111 mL/min/{1.73_m2} (ref >59)
GFR calc non Af Amer: 97 mL/min/{1.73_m2} (ref >59)
Globulin, Total: 2.5 g/dL (ref 1.5–4.5)
Glucose: 79 mg/dL (ref 65–99)
Potassium: 4 mmol/L (ref 3.5–5.2)
Sodium: 134 mmol/L (ref 134–144)
Total Protein: 7.2 g/dL (ref 6.0–8.5)

## 2019-11-12 LAB — CBC WITH DIFFERENTIAL/PLATELET
Basophils Absolute: 0 10*3/uL (ref 0.0–0.2)
Basos: 0 %
EOS (ABSOLUTE): 0 10*3/uL (ref 0.0–0.4)
Eos: 0 %
Hematocrit: 44.7 % (ref 34.0–46.6)
Hemoglobin: 15.2 g/dL (ref 11.1–15.9)
Immature Grans (Abs): 0 10*3/uL (ref 0.0–0.1)
Immature Granulocytes: 0 %
Lymphocytes Absolute: 1.2 10*3/uL (ref 0.7–3.1)
Lymphs: 25 %
MCH: 29.3 pg (ref 26.6–33.0)
MCHC: 34 g/dL (ref 31.5–35.7)
MCV: 86 fL (ref 79–97)
Monocytes Absolute: 0.4 10*3/uL (ref 0.1–0.9)
Monocytes: 9 %
Neutrophils Absolute: 3.2 10*3/uL (ref 1.4–7.0)
Neutrophils: 66 %
Platelets: 278 10*3/uL (ref 150–450)
RBC: 5.18 x10E6/uL (ref 3.77–5.28)
RDW: 12.9 % (ref 11.7–15.4)
WBC: 4.9 10*3/uL (ref 3.4–10.8)

## 2019-11-12 LAB — LIPID PANEL WITH LDL/HDL RATIO
Cholesterol, Total: 220 mg/dL — ABNORMAL HIGH (ref 100–199)
HDL: 72 mg/dL (ref >39)
LDL Chol Calc (NIH): 136 mg/dL — ABNORMAL HIGH (ref 0–99)
LDL/HDL Ratio: 1.9 ratio (ref 0.0–3.2)
Triglycerides: 67 mg/dL (ref 0–149)
VLDL Cholesterol Cal: 12 mg/dL (ref 5–40)

## 2019-11-12 LAB — VITAMIN D 25 HYDROXY (VIT D DEFICIENCY, FRACTURES): Vit D, 25-Hydroxy: 36.7 ng/mL (ref 30.0–100.0)

## 2019-11-12 LAB — INSULIN, RANDOM: INSULIN: 8.5 u[IU]/mL (ref 2.6–24.9)

## 2019-11-16 NOTE — Progress Notes (Signed)
Chief Complaint:   OBESITY VONNE JOANIS is here to discuss her progress with her obesity treatment plan along with follow-up of her obesity related diagnoses. Lima is on the [employer] and states she is following her eating plan approximately 80% of the time. Quinci states she is walking for 30 minutes 2 times per week.  Today's visit was #: 43 Starting weight: 193 lbs Starting date: 12/24/2018 Today's weight: 173 lbs Today's date: 11/11/2019 Total lbs lost to date: 20 lbs Total lbs lost since last in-office visit: 6 lbs  Interim History: Manuella states that taking Wellbutrin helps with cravings, so it has decreased the amount of snacking she does.  She enjoyed the holidays.  Subjective:   1. Vitamin D deficiency She's Vitamin D level was 22.5 on 12/24/2018. She is currently taking vit D. She denies nausea, vomiting or muscle weakness.  2. Essential hypertension Review: taking HCTZ and lisinopril as instructed with no medication side effects noted.  No chest pain, shortness of breath, headache, dizziness, or edema.  Will monitor with initiation of Wellbutrin.   BP Readings from Last 3 Encounters:  11/11/19 (!) 144/73  10/12/19 134/72  09/21/19 127/81   3. Vitamin B12 deficiency She is not a vegetarian.  She does not have a previous diagnosis of pernicious anemia.  She does not have a history of weight loss surgery.   4. Hyperglycemia Bradleigh has a history of some elevated blood glucose readings without a diagnosis of diabetes. She denies polyphagia.  5. Other fatigue Mirka feels her energy is lower than it should be.   6. Other depression, with emotional eating  Mayline is struggling with emotional eating and using food for comfort to the extent that it is negatively impacting her health. She often snacks when she is not hungry. Markeya sometimes feels she is out of control and then feels guilty that she made poor food choices. She has been working on behavior modification techniques  to help reduce her emotional eating and has been somewhat successful. She shows no sign of suicidal or homicidal ideations.  Assessment/Plan:   1. Vitamin D deficiency Low Vitamin D level contributes to fatigue and are associated with obesity, breast, and colon cancer. She agrees to continue to take prescription Vitamin D @50 ,000 IU every week and will follow-up for routine testing of vitamin D, at least 2-3 times per year to avoid over-replacement.  Orders - Vitamin D (25 hydroxy) - Vitamin D, Ergocalciferol, (DRISDOL) 1.25 MG (50000 UT) CAPS capsule; Take 1 capsule (50,000 Units total) by mouth every 7 (seven) days.  Dispense: 4 capsule; Refill: 0  2. Essential hypertension Allisen is working on healthy weight loss and exercise to improve blood pressure control. We will watch for signs of hypotension as she continues her lifestyle modifications.   Orders - Comprehensive Metabolic Panel (CMET) - CBC w/Diff/Platelet - Insulin, random - Lipid Panel With LDL/HDL Ratio  3. Vitamin B12 deficiency The diagnosis was reviewed with the patient. Counseling provided today, see below. We will continue to monitor. Orders and follow up as documented in patient record.  Counseling . The body needs vitamin B12: to make red blood cells; to make DNA; and to help the nerves work properly so they can carry messages from the brain to the body.  . The main causes of vitamin B12 deficiency include dietary deficiency, digestive diseases, pernicious anemia, and having a surgery in which part of the stomach or small intestine is removed.  . Certain  medicines can make it harder for the body to absorb vitamin B12. These medicines include: heartburn medications; some antibiotics; some medications used to treat diabetes, gout, and high cholesterol.  . In some cases, there are no symptoms of this condition. If the condition leads to anemia or nerve damage, various symptoms can occur, such as weakness or fatigue, shortness  of breath, and numbness or tingling in your hands and feet.   . Treatment:  o May include taking vitamin B12 supplements.  o Avoid alcohol.  o Eat lots of healthy foods that contain vitamin B12: - Beef, pork, chicken, [place], and organ meats, such as liver.  - Seafood: This includes clams, rainbow trout, salmon, tuna, and haddock. Eggs.  - Cereal and dairy products that are fortified: This means that vitamin B12 has been added to the food.   4. Hyperglycemia Fasting labs will be obtained and results with be discussed with Ival in 2 weeks at her follow up visit. In the meanwhile Yumi was started on a lower simple carbohydrate diet and will work on weight loss efforts.  5. Other fatigue Skyra was informed fatigue may be related to obesity, depression or many other causes. Labs will be ordered, and in the meanwhile Symphanie has agreed to work on diet, exercise and weight loss.  6. Other depression, with emotional eating  Behavior modification techniques were discussed today to help Jisel deal with her emotional/non-hunger eating behaviors.  Orders and follow up as documented in patient record.   7. Class 2 severe obesity with serious comorbidity and body mass index (BMI) of 35.0 to 35.9 in adult, unspecified obesity type ([facility]) Brena is currently in the action stage of change. As such, her goal is to continue with weight loss efforts. She has agreed to on the [employer].   We discussed the following exercise goals today: Older adults should follow the adult guidelines. When older adults cannot meet the adult guidelines, they should be as physically active as their abilities and conditions will allow.  Older adults should do exercises that maintain or improve balance if they are at risk of falling.   We discussed the following behavioral modification strategies today: increasing lean protein intake, increasing water intake and planning for success.  CHELCE NOTCH has agreed to follow-up with our  clinic in 2 weeks. She was informed of the importance of frequent follow-up visits to maximize her success with intensive lifestyle modifications for her multiple health conditions.   Alleya was informed we would discuss her lab results at her next visit unless there is a critical issue that needs to be addressed sooner. Tabathia agreed to keep her next visit at the agreed upon time to discuss these results.  Objective:   Blood pressure (!) 144/73, pulse 72, temperature 97.6 F (36.4 C), temperature source Oral, height 4\' 11"  (1.499 m), weight 173 lb (78.5 kg), SpO2 99 %. Body mass index is 34.94 kg/m.  General: Cooperative, alert, well developed, in no acute distress. HEENT: Conjunctivae and lids unremarkable. Neck: No thyromegaly.  Cardiovascular: Regular rhythm.  Lungs: Normal work of breathing. Extremities: No edema.  Neurologic: No focal deficits.   Lab Results  Component Value Date   CREATININE 0.55 (L) 11/11/2019   BUN 7 (L) 11/11/2019   NA 134 11/11/2019   K 4.0 11/11/2019   CL 95 (L) 11/11/2019   CO2 27 11/11/2019   Lab Results  Component Value Date   ALT 18 11/11/2019   AST 19 11/11/2019  ALKPHOS 90 11/11/2019   BILITOT 0.5 11/11/2019   Lab Results  Component Value Date   HGBA1C 5.1 06/08/2019   HGBA1C 5.1 12/24/2018   Lab Results  Component Value Date   INSULIN 8.5 11/11/2019   INSULIN 8.0 06/08/2019   INSULIN 11.9 12/24/2018   Lab Results  Component Value Date   TSH 0.461 12/24/2018   Lab Results  Component Value Date   CHOL 220 (H) 11/11/2019   HDL 72 11/11/2019   LDLCALC 136 (H) 11/11/2019   TRIG 67 11/11/2019   Lab Results  Component Value Date   WBC 4.9 11/11/2019   HGB 15.2 11/11/2019   HCT 44.7 11/11/2019   MCV 86 11/11/2019   PLT 278 11/11/2019   Obesity Behavioral Intervention:   Approximately 15 minutes were spent on the discussion below.  ASK: We discussed the diagnosis of obesity with Oneal Deputy today and Nazariah agreed to give [name]  permission to discuss obesity behavioral modification therapy today.  ASSESS: Violanda has the diagnosis of obesity and her BMI today is 35.1. Brendalynn is in the action stage of change.   ADVISE: Merel was educated on the multiple health risks of obesity as well as the benefit of weight loss to improve her health. She was advised of the need for long term treatment and the importance of lifestyle modifications to improve her current health and to decrease her risk of future health problems.  AGREE: Multiple dietary modification options and treatment options were discussed and Taiylor agreed to follow the recommendations documented in the above note.  ARRANGE: Miha was educated on the importance of frequent visits to treat obesity as outlined per CMS and USPSTF guidelines and agreed to schedule her next follow up appointment today.  Attestation Statements:   Reviewed by clinician on day of visit: allergies, medications, problem list, medical history, surgical history, family history, social history, and previous encounter notes.  I, Water quality scientist, CMA, am acting as Location manager for [employer], DO.  I have reviewed the above documentation for accuracy and completeness, and I agree with the above. Briscoe Deutscher, DO

## 2019-11-17 ENCOUNTER — Encounter (INDEPENDENT_AMBULATORY_CARE_PROVIDER_SITE_OTHER): Payer: Self-pay | Admitting: Family Medicine

## 2019-11-17 NOTE — Progress Notes (Signed)
  Office: [phone]  /  Fax: [phone]    Date: December 01, 2019   Time Seen: 1:59pm Duration: 26 minutes Provider: [name], Psy.D. Type of Session: Individual Therapy  Type of Contact: Face-to-face  Session Content: Jennifer Tyler is a 69 y.o. female presenting for a follow-up appointment to address the previously established treatment goal of decreasing emotional eating. The session was initiated a brief check-in. Jennifer Tyler shared about recent events, including the resolution of a recent stressor. She also discussed having the "mindset" of deviating from the meal plan when she loses weight. This was explored further and processed. It was reflected she may be using food in those moments to reward herself; she agreed. Thus, other ways to reward herself were explored. She noted a plan to identify personal items she would like for herself and set goals for herself to earn those rewards. To further assist with coping, psychoeducation regarding pleasurable activities, including its impact on emotional eating and overall well-being was provided. Jennifer Tyler was provided with a handout with various options of pleasurable activities, and was encouraged to engage in one activity a day and additional activities as needed when triggered to emotionally eat. Jennifer Tyler agreed. Overall, Jennifer Tyler was receptive to today's appointment as evidenced by openness to sharing, responsiveness to feedback, and willingness to implement discussed strategies .  Mental Status Examination:  Appearance: well groomed and appropriate hygiene  Behavior: appropriate to circumstances Mood: euthymic Affect: mood congruent Speech: normal in rate, volume, and tone Eye Contact: appropriate Psychomotor Activity: appropriate Gait: normal Thought Process: linear, logical, and goal directed  Thought Content/Perception: no hallucinations, delusions, bizarre thinking or behavior reported or observed and no evidence of suicidal and homicidal ideation, plan,  and intent Orientation: time, person, place and purpose of appointment Memory/Concentration: memory, attention, language, and fund of knowledge intact  Insight/Judgment: good  Interventions:  Conducted a brief chart review Provided empathic reflections and validation Employed supportive psychotherapy interventions to facilitate reduced distress and to improve coping skills with identified stressors Employed motivational interviewing skills to assess patient's willingness/desire to adhere to recommended medical treatments and assignments Engaged patient in problem solving Psychoeducation provided regarding pleasurable activities  DSM-5 Diagnosis: 311 (F32.8) Other Specified Depressive Disorder, Emotional Eating Behaviors  Treatment Goal & Progress: During the initial appointment with this provider, the following treatment goal was established: decrease emotional eating. Jennifer Tyler has demonstrated progress in her goal as evidenced by increased awareness of hunger patterns, increased awareness of triggers for emotional eating and reduction in emotional eating. Jennifer Tyler also demonstrates willingness to engage in pleasurable activities.  Plan: The next appointment will be scheduled in one month, via Webex. The next session will focus on reviewing learned skills and termination.

## 2019-12-01 ENCOUNTER — Other Ambulatory Visit: Payer: Self-pay

## 2019-12-01 ENCOUNTER — Ambulatory Visit (INDEPENDENT_AMBULATORY_CARE_PROVIDER_SITE_OTHER): Payer: Medicare Other | Admitting: Family Medicine

## 2019-12-01 ENCOUNTER — Ambulatory Visit (INDEPENDENT_AMBULATORY_CARE_PROVIDER_SITE_OTHER): Payer: Medicare Other | Admitting: Psychology

## 2019-12-01 ENCOUNTER — Encounter (INDEPENDENT_AMBULATORY_CARE_PROVIDER_SITE_OTHER): Payer: Self-pay | Admitting: Family Medicine

## 2019-12-01 VITALS — BP 118/73 | HR 68 | Temp 98.3°F | Ht 59.0 in | Wt 171.0 lb

## 2019-12-01 DIAGNOSIS — E538 Deficiency of other specified B group vitamins: Secondary | ICD-10-CM | POA: Diagnosis not present

## 2019-12-01 DIAGNOSIS — F3289 Other specified depressive episodes: Secondary | ICD-10-CM

## 2019-12-01 DIAGNOSIS — E7849 Other hyperlipidemia: Secondary | ICD-10-CM | POA: Diagnosis not present

## 2019-12-01 DIAGNOSIS — Z6834 Body mass index (BMI) 34.0-34.9, adult: Secondary | ICD-10-CM

## 2019-12-01 DIAGNOSIS — I10 Essential (primary) hypertension: Secondary | ICD-10-CM | POA: Diagnosis not present

## 2019-12-01 DIAGNOSIS — E559 Vitamin D deficiency, unspecified: Secondary | ICD-10-CM

## 2019-12-01 DIAGNOSIS — E669 Obesity, unspecified: Secondary | ICD-10-CM

## 2019-12-01 MED ORDER — VITAMIN D (ERGOCALCIFEROL) 1.25 MG (50000 UNIT) PO CAPS: 50000.0000 [IU] | ORAL_CAPSULE | ORAL | 0 refills | Status: DC

## 2019-12-02 NOTE — Progress Notes (Signed)
Chief Complaint:   OBESITY Jennifer Tyler is here to discuss her progress with her obesity treatment plan along with follow-up of her obesity related diagnoses. Jennifer Tyler is on the [employer] and states she is following her eating plan approximately 70% of the time. Jennifer Tyler states she is walking for 30 minutes 3 times per week.  Today's visit was #: 11 Starting weight: 193 lbs Starting date: 12/24/2018 Today's weight: 171 lbs Today's date: 12/01/2019 Total lbs lost to date: 22 lbs Total lbs lost since last in-office visit: 2 lbs  Interim History: Jennifer Tyler says that since last visit she had one celebration meal of fried shrimp.  She is not always getting in all of her protein.  Subjective:   1. Vitamin D deficiency Jennifer Tyler's Vitamin D level was 36.7 on 11/11/2019. She is currently taking vit D. She denies nausea, vomiting or muscle weakness.  2. Essential hypertension Review: taking medications as instructed, no medication side effects noted, no chest pain on exertion, no dyspnea on exertion, no swelling of ankles.  She is taking lisinopril and HCTZ for blood pressure control.  BP Readings from Last 3 Encounters:  12/01/19 118/73  11/11/19 (!) 144/73  10/12/19 134/72   3. Vitamin B12 deficiency She is not a vegetarian.  She does not have a previous diagnosis of pernicious anemia.  She does not have a history of weight loss surgery.  She is taking vitamin B12.   4. Other hyperlipidemia Jennifer Tyler has hyperlipidemia and has been trying to improve her cholesterol levels with intensive lifestyle modification including a low saturated fat diet, exercise and weight loss. She denies any chest pain, claudication or myalgias.  Lab Results  Component Value Date   ALT 18 11/11/2019   AST 19 11/11/2019   ALKPHOS 90 11/11/2019   BILITOT 0.5 11/11/2019   Lab Results  Component Value Date   CHOL 220 (H) 11/11/2019   HDL 72 11/11/2019   LDLCALC 136 (H) 11/11/2019   TRIG 67 11/11/2019   5. Other depression,  with emotional eating  Jennifer Tyler is struggling with emotional eating and using food for comfort to the extent that it is negatively impacting her health. She has been working on behavior modification techniques to help reduce her emotional eating and has been minimally successful. She shows no sign of suicidal or homicidal ideations.  She is taking Wellbutrin 150 mg daily.  Assessment/Plan:   1. Vitamin D deficiency Low Vitamin D level contributes to fatigue and are associated with obesity, breast, and colon cancer. She agrees to continue to take prescription Vitamin D @50 ,000 IU every week and will follow-up for routine testing of Vitamin D, at least 2-3 times per year to avoid over-replacement.  Orders - Vitamin D, Ergocalciferol, (DRISDOL) 1.25 MG (50000 UNIT) CAPS capsule; Take 1 capsule (50,000 Units total) by mouth every 7 (seven) days.  Dispense: 4 capsule; Refill: 0  2. Essential hypertension Jennifer Tyler is working on healthy weight loss and exercise to improve blood pressure control. We will watch for signs of hypotension as she continues her lifestyle modifications.  3. Vitamin B12 deficiency The diagnosis was reviewed with the patient. Counseling provided today, see below. We will continue to monitor. Orders and follow up as documented in patient record.  Counseling . The body needs vitamin B12: to make red blood cells; to make DNA; and to help the nerves work properly so they can carry messages from the brain to the body.  . The main causes of vitamin B12 deficiency  include dietary deficiency, digestive diseases, pernicious anemia, and having a surgery in which part of the stomach or small intestine is removed.  . Certain medicines can make it harder for the body to absorb vitamin B12. These medicines include: heartburn medications; some antibiotics; some medications used to treat diabetes, gout, and high cholesterol.  . In some cases, there are no symptoms of this condition. If the condition  leads to anemia or nerve damage, various symptoms can occur, such as weakness or fatigue, shortness of breath, and numbness or tingling in your hands and feet.   . Treatment:  o May include taking vitamin B12 supplements.  o Avoid alcohol.  o Eat lots of healthy foods that contain vitamin B12: - Beef, pork, chicken, [place], and organ meats, such as liver.  - Seafood: This includes clams, rainbow trout, salmon, tuna, and haddock. Eggs.  - Cereal and dairy products that are fortified: This means that vitamin B12 has been added to the food.   4. Other hyperlipidemia Cardiovascular risk and specific lipid/LDL goals reviewed.  We discussed several lifestyle modifications today and Jennifer Tyler will continue to work on diet, exercise and weight loss efforts. Orders and follow up as documented in patient record.   Counseling Intensive lifestyle modifications are the first line treatment for this issue. . Dietary changes: Increase soluble fiber. Decrease simple carbohydrates. . Exercise changes: Moderate to vigorous-intensity aerobic activity 150 minutes per week if tolerated. . Lipid-lowering medications: see documented in medical record.  5. Other depression, with emotional eating  Behavior modification techniques were discussed today to help Jennifer Tyler deal with her emotional/non-hunger eating behaviors.  Orders and follow up as documented in patient record.   6. Class 1 obesity with serious comorbidity and body mass index (BMI) of 34.0 to 34.9 in adult, unspecified obesity type Jennifer Tyler is currently in the action stage of change. As such, her goal is to continue with weight loss efforts. She has agreed to the [employer].   Exercise goals: Start using resistance bands.  Behavioral modification strategies: increasing lean protein intake and celebration eating strategies.  Jennifer Tyler has agreed to follow-up with our clinic in 2 weeks. She was informed of the importance of frequent follow-up visits to maximize her  success with intensive lifestyle modifications for her multiple health conditions.   Objective:   Blood pressure 118/73, pulse 68, temperature 98.3 F (36.8 C), temperature source Oral, height 4\' 11"  (1.499 m), weight 171 lb (77.6 kg), SpO2 97 %. Body mass index is 34.54 kg/m.  General: Cooperative, alert, well developed, in no acute distress. HEENT: Conjunctivae and lids unremarkable. Cardiovascular: Regular rhythm.  Lungs: Normal work of breathing. Neurologic: No focal deficits.   Lab Results  Component Value Date   CREATININE 0.55 (L) 11/11/2019   BUN 7 (L) 11/11/2019   NA 134 11/11/2019   K 4.0 11/11/2019   CL 95 (L) 11/11/2019   CO2 27 11/11/2019   Lab Results  Component Value Date   ALT 18 11/11/2019   AST 19 11/11/2019   ALKPHOS 90 11/11/2019   BILITOT 0.5 11/11/2019   Lab Results  Component Value Date   HGBA1C 5.1 06/08/2019   HGBA1C 5.1 12/24/2018   Lab Results  Component Value Date   INSULIN 8.5 11/11/2019   INSULIN 8.0 06/08/2019   INSULIN 11.9 12/24/2018   Lab Results  Component Value Date   TSH 0.461 12/24/2018   Lab Results  Component Value Date   CHOL 220 (H) 11/11/2019   HDL 72 11/11/2019  LDLCALC 136 (H) 11/11/2019   TRIG 67 11/11/2019   Lab Results  Component Value Date   WBC 4.9 11/11/2019   HGB 15.2 11/11/2019   HCT 44.7 11/11/2019   MCV 86 11/11/2019   PLT 278 11/11/2019   Attestation Statements:   Reviewed by clinician on day of visit: allergies, medications, problem list, medical history, surgical history, family history, social history, and previous encounter notes.  I performed a medically necessary appropriate examination and/or evaluation. I discussed the assessment and treatment plan with the patient. The patient was provided an opportunity to ask questions and all were answered. The patient agreed with the plan and demonstrated an understanding of the instructions. Clinical information was updated and documented in the  EMR. Time spent on visit including pre-visit chart review and post-visit care was 45 minutes.  I, Water quality scientist, CMA, am acting as Location manager for [employer], DO.  I have reviewed the above documentation for accuracy and completeness, and I agree with the above. Briscoe Deutscher, DO

## 2019-12-06 ENCOUNTER — Other Ambulatory Visit (INDEPENDENT_AMBULATORY_CARE_PROVIDER_SITE_OTHER): Payer: Self-pay | Admitting: Family Medicine

## 2019-12-06 ENCOUNTER — Other Ambulatory Visit: Payer: Self-pay | Admitting: Family Medicine

## 2019-12-06 DIAGNOSIS — Z1231 Encounter for screening mammogram for malignant neoplasm of breast: Secondary | ICD-10-CM

## 2019-12-06 DIAGNOSIS — F3289 Other specified depressive episodes: Secondary | ICD-10-CM

## 2019-12-06 MED ORDER — BUPROPION HCL ER (SR) 150 MG PO TB12: 150.0000 mg | ORAL_TABLET | Freq: Every day | ORAL | 0 refills | Status: DC

## 2019-12-15 NOTE — Progress Notes (Signed)
Office: [phone]  /  Fax: [phone]    Date: December 29, 2019   Appointment Start Time: 2:06pm Duration: 21 minutes Provider: [name], Psy.D. Type of Session: Individual Therapy  Location of Patient: Home Location of Provider: Provider's Home Type of Contact: Telepsychological Visit via Telephone Call  Session Content: This provider called Jennifer Tyler at 2:02pm as she did not present for the WebEx appointment. Directions on connecting were provided; however, Jennifer Tyler continued to report challenges connecting. As such, the appointment proceeded via a regular telephone call and today's appointment was initiated 6 minutes late. Prior to initiating telepsychological services, Jennifer Tyler completed an informed consent document, which included the development of a safety plan (i.e., an emergency contact, nearest emergency room, and emergency resources) in the event of an emergency/crisis. Jennifer Tyler expressed understanding of the rationale of the safety plan. Jennifer Tyler verbally acknowledged understanding she is ultimately responsible for understanding her insurance benefits for telepsychological and in-person services. This provider also reviewed confidentiality, as it relates to telepsychological services, as well as the rationale for telepsychological services (i.e., to reduce exposure risk to COVID-19). Jennifer Tyler  acknowledged understanding that appointments cannot be recorded without both party consent and she is aware she is responsible for securing confidentiality on her end of the session. Jennifer Tyler verbally consented to proceed.   Jennifer Tyler is a 69 y.o. female presenting via Telephone Call for a follow-up appointment to address the previously established treatment goal of decreasing emotional eating. Today's appointment was a telepsychological visit due to COVID-19. Jennifer Tyler provided verbal consent for today's telepsychological appointment and she is aware she is responsible for securing confidentiality on her end of the session.  Prior to proceeding with today's appointment, Jennifer Tyler's physical location at the time of this appointment was obtained as well a phone number she could be reached at in the event of technical difficulties. Jennifer Tyler and this provider participated in today's telepsychological service.   This provider conducted a brief check-in. Jennifer Tyler reported "not much" has changed since the last appointment. She reported she is following her meal plan "70%" of the time and she continues to report a reduction in emotional eating. Regarding protein, Jennifer Tyler noted an increase in intake since her last appointment with [name], PA-C on December 22, 2019. A plan was developed to help Jennifer Tyler cope with emotional eating in the future using learned skills. She wrote down the following plan: focus on hydration, be prepared with snacks congruent to the meal plan, pause to ask questions when triggered to eat (e.g., Am I really hungry?; Is there something bothering me? Will I feel better if I eat?), and engage in pleasurable activities and/or other coping skills after going through the aforementioned questions. Jennifer Tyler was receptive to today's appointment as evidenced by openness to sharing, responsiveness to feedback, and willingness to continue engaging in learned skills.  Mental Status Examination:  Appearance: unable to assess  Behavior: unable to assess Mood: euthymic Affect: unable to fully assess Speech: normal in rate, volume, and tone Eye Contact: unable to assess  Psychomotor Activity: unable to assess  Gait: unable to assess Thought Process: linear, logical, and goal directed  Thought Content/Perception: no hallucinations, delusions, bizarre thinking or behavior reported or observed and no evidence of suicidal and homicidal ideation, plan, and intent Orientation: time, person, place and purpose of appointment Memory/Concentration: memory, attention, language, and fund of knowledge intact  Insight/Judgment: good  Interventions:   Conducted a brief chart review Provided empathic reflections and validation Employed supportive psychotherapy interventions to facilitate reduced distress and  to improve coping skills with identified stressors Employed motivational interviewing skills to assess patient's willingness/desire to adhere to recommended medical treatments and assignments Reviewed learned skills  DSM-5 Diagnosis: 311 (F32.8) Other Specified Depressive Disorder, Emotional Eating Behaviors  Treatment Goal & Progress: During the initial appointment with this provider, the following treatment goal was established: decrease emotional eating. Jennifer Tyler demonstrated progress in her goal as evidenced by increased awareness of hunger patterns, increased awareness of triggers for emotional eating and reduction in emotional eating. Jennifer Tyler also continues to demonstrate willingness to engage in learned skill(s).  Plan: As previously planned, today was Jennifer Tyler's last appointment with this provider. She acknowledged understanding that she may request a follow-up appointment with this provider in the future as long as she is still established with the clinic. No further follow-up planned by this provider.

## 2019-12-22 ENCOUNTER — Ambulatory Visit (INDEPENDENT_AMBULATORY_CARE_PROVIDER_SITE_OTHER): Payer: Medicare Other | Admitting: Physician Assistant

## 2019-12-22 ENCOUNTER — Encounter (INDEPENDENT_AMBULATORY_CARE_PROVIDER_SITE_OTHER): Payer: Self-pay | Admitting: Physician Assistant

## 2019-12-22 ENCOUNTER — Other Ambulatory Visit: Payer: Self-pay

## 2019-12-22 VITALS — BP 131/77 | Temp 98.3°F | Ht 59.0 in | Wt 172.0 lb

## 2019-12-22 DIAGNOSIS — I10 Essential (primary) hypertension: Secondary | ICD-10-CM

## 2019-12-22 DIAGNOSIS — E669 Obesity, unspecified: Secondary | ICD-10-CM

## 2019-12-22 DIAGNOSIS — Z6834 Body mass index (BMI) 34.0-34.9, adult: Secondary | ICD-10-CM | POA: Diagnosis not present

## 2019-12-22 NOTE — Progress Notes (Signed)
Chief Complaint:   OBESITY Jennifer Tyler is here to discuss her progress with her obesity treatment plan along with follow-up of her obesity related diagnoses. Jennifer Tyler is on the [employer] and states she is following her eating plan approximately 70% of the time. Jennifer Tyler states she is walking for 30 minutes and stretching for 10 minutes 4 times per week.  Today's visit was #: 20 Starting weight: 193 lbs Starting date: 12/24/2018 Today's weight: 172 lbs Today's date: 12/22/2019 Total lbs lost to date: 21 lbs Total lbs lost since last in-office visit: 0  Interim History: Jennifer Tyler is not eating enough protein, especially at dinner.  She has started doing some resistance training with bands.  Subjective:   1. Essential hypertension Review: taking medications as instructed, no medication side effects noted, no chest pain on exertion, no dyspnea on exertion, no swelling of ankles.  She is taking lisinopril and HCTZ.  Blood pressure is normal.   BP Readings from Last 3 Encounters:  12/22/19 131/77  12/01/19 118/73  11/11/19 (!) 144/73   Assessment/Plan:   1. Essential hypertension Jennifer Tyler is working on healthy weight loss and exercise to improve blood pressure control. We will watch for signs of hypotension as she continues her lifestyle modifications.  2. Class 1 obesity with serious comorbidity and body mass index (BMI) of 34.0 to 34.9 in adult, unspecified obesity type Jennifer Tyler is currently in the action stage of change. As such, her goal is to continue with weight loss efforts. She has agreed to the [employer].   Exercise goals: Older adults should follow the adult guidelines. When older adults cannot meet the adult guidelines, they should be as physically active as their abilities and conditions will allow.  Older adults should do exercises that maintain or improve balance if they are at risk of falling.   Behavioral modification strategies: increasing lean protein intake and meal planning  and cooking strategies.  Jennifer Tyler has agreed to follow-up with our clinic in 3 weeks. She was informed of the importance of frequent follow-up visits to maximize her success with intensive lifestyle modifications for her multiple health conditions.   Objective:   Blood pressure 131/77, temperature 98.3 F (36.8 C), temperature source Oral, height 4\' 11"  (1.499 m), weight 172 lb (78 kg). Body mass index is 34.74 kg/m.  General: Cooperative, alert, well developed, in no acute distress. HEENT: Conjunctivae and lids unremarkable. Cardiovascular: Regular rhythm.  Lungs: Normal work of breathing. Neurologic: No focal deficits.   Lab Results  Component Value Date   CREATININE 0.55 (L) 11/11/2019   BUN 7 (L) 11/11/2019   NA 134 11/11/2019   K 4.0 11/11/2019   CL 95 (L) 11/11/2019   CO2 27 11/11/2019   Lab Results  Component Value Date   ALT 18 11/11/2019   AST 19 11/11/2019   ALKPHOS 90 11/11/2019   BILITOT 0.5 11/11/2019   Lab Results  Component Value Date   HGBA1C 5.1 06/08/2019   HGBA1C 5.1 12/24/2018   Lab Results  Component Value Date   INSULIN 8.5 11/11/2019   INSULIN 8.0 06/08/2019   INSULIN 11.9 12/24/2018   Lab Results  Component Value Date   TSH 0.461 12/24/2018   Lab Results  Component Value Date   CHOL 220 (H) 11/11/2019   HDL 72 11/11/2019   LDLCALC 136 (H) 11/11/2019   TRIG 67 11/11/2019   Lab Results  Component Value Date   WBC 4.9 11/11/2019   HGB 15.2 11/11/2019   HCT  44.7 11/11/2019   MCV 86 11/11/2019   PLT 278 11/11/2019   Obesity Behavioral Intervention Documentation for Insurance:   Approximately 15 minutes were spent on the discussion below.  ASK: We discussed the diagnosis of obesity with Jennifer Tyler today and Jennifer Tyler agreed to give [name] permission to discuss obesity behavioral modification therapy today.  ASSESS: Jennifer Tyler has the diagnosis of obesity and her BMI today is 34.9. Jennifer Tyler is in the action stage of change.   ADVISE: Jennifer Tyler was educated on  the multiple health risks of obesity as well as the benefit of weight loss to improve her health. She was advised of the need for long term treatment and the importance of lifestyle modifications to improve her current health and to decrease her risk of future health problems.  AGREE: Multiple dietary modification options and treatment options were discussed and Jennifer Tyler agreed to follow the recommendations documented in the above note.  ARRANGE: Jennifer Tyler was educated on the importance of frequent visits to treat obesity as outlined per CMS and USPSTF guidelines and agreed to schedule her next follow up appointment today.  Attestation Statements:   Reviewed by clinician on day of visit: allergies, medications, problem list, medical history, surgical history, family history, social history, and previous encounter notes.  I, Water quality scientist, CMA, am acting as Location manager for [employer], PA-C.  I have reviewed the above documentation for accuracy and completeness, and I agree with the above. [name], PA-C

## 2019-12-29 ENCOUNTER — Other Ambulatory Visit: Payer: Self-pay

## 2019-12-29 ENCOUNTER — Ambulatory Visit (INDEPENDENT_AMBULATORY_CARE_PROVIDER_SITE_OTHER): Payer: Medicare Other | Admitting: Psychology

## 2019-12-29 DIAGNOSIS — F5089 Other specified eating disorder: Secondary | ICD-10-CM

## 2019-12-29 DIAGNOSIS — F3289 Other specified depressive episodes: Secondary | ICD-10-CM

## 2020-01-03 ENCOUNTER — Encounter (INDEPENDENT_AMBULATORY_CARE_PROVIDER_SITE_OTHER): Payer: Self-pay | Admitting: Physician Assistant

## 2020-01-03 NOTE — Telephone Encounter (Signed)
Please review

## 2020-01-11 ENCOUNTER — Ambulatory Visit (INDEPENDENT_AMBULATORY_CARE_PROVIDER_SITE_OTHER): Payer: Medicare Other | Admitting: Physician Assistant

## 2020-01-12 ENCOUNTER — Other Ambulatory Visit: Payer: Self-pay

## 2020-01-12 ENCOUNTER — Ambulatory Visit
Admission: RE | Admit: 2020-01-12 | Discharge: 2020-01-12 | Disposition: A | Discharge status: Home / Self Care | Payer: Medicare Other | Source: Ambulatory Visit | Attending: Family Medicine | Admitting: Family Medicine

## 2020-01-12 DIAGNOSIS — Z1231 Encounter for screening mammogram for malignant neoplasm of breast: Secondary | ICD-10-CM

## 2020-01-18 ENCOUNTER — Encounter (INDEPENDENT_AMBULATORY_CARE_PROVIDER_SITE_OTHER): Payer: Self-pay | Admitting: Family Medicine

## 2020-01-18 ENCOUNTER — Other Ambulatory Visit: Payer: Self-pay

## 2020-01-18 ENCOUNTER — Other Ambulatory Visit (INDEPENDENT_AMBULATORY_CARE_PROVIDER_SITE_OTHER): Payer: Self-pay | Admitting: Family Medicine

## 2020-01-18 ENCOUNTER — Ambulatory Visit (INDEPENDENT_AMBULATORY_CARE_PROVIDER_SITE_OTHER): Payer: Medicare Other | Admitting: Family Medicine

## 2020-01-18 VITALS — BP 134/74 | HR 64 | Temp 97.8°F | Ht 59.0 in | Wt 179.0 lb

## 2020-01-18 DIAGNOSIS — E559 Vitamin D deficiency, unspecified: Secondary | ICD-10-CM

## 2020-01-18 DIAGNOSIS — F3289 Other specified depressive episodes: Secondary | ICD-10-CM

## 2020-01-18 DIAGNOSIS — Z6836 Body mass index (BMI) 36.0-36.9, adult: Secondary | ICD-10-CM

## 2020-01-18 MED ORDER — VITAMIN D (ERGOCALCIFEROL) 1.25 MG (50000 UNIT) PO CAPS: 50000.0000 [IU] | ORAL_CAPSULE | ORAL | 0 refills | Status: DC

## 2020-01-18 MED ORDER — BUPROPION HCL ER (SR) 150 MG PO TB12: 150.0000 mg | ORAL_TABLET | Freq: Two times a day (BID) | ORAL | 0 refills | Status: DC

## 2020-01-18 NOTE — Progress Notes (Signed)
Chief Complaint:   OBESITY Maetta is here to discuss her progress with her obesity treatment plan along with follow-up of her obesity related diagnoses. Aniston is on the [employer] and states she is following her eating plan approximately 50% of the time. Robi states she is using stretch bands and walking for 20-30 minutes 4 times per week.  Today's visit was #: 21 Starting weight: 193 lbs Starting date: 12/24/2018 Today's weight: 179 lbs Today's date: 01/18/2020 Total lbs lost to date: 14 Total lbs lost since last in-office visit: 0  Interim History: Sharonlee reports increased stress eating over the past few weeks. She is eating the food on the plan, but also indulging in extra carbohydrates.  Subjective:   1. Vitamin D deficiency Deyna's Vit D level is not at goal. She is on prescription Vit D 50,000 IU weekly.  2. Other depression, with emotional eating  Chrisanna reports bupropion helped with cravings but the effect wore off. Her blood pressure is stable. She has had increased cravings over the past few week. Her mood is stable overall.  Assessment/Plan:   1. Vitamin D deficiency Low Vitamin D level contributes to fatigue and are associated with obesity, breast, and colon cancer. We will refill prescription Vitamin D for 1 month. Breezie will follow-up for routine testing of Vitamin D, at least 2-3 times per year to avoid over-replacement.  - Vitamin D, Ergocalciferol, (DRISDOL) 1.25 MG (50000 UNIT) CAPS capsule; Take 1 capsule (50,000 Units total) by mouth every 7 (seven) days.  Dispense: 4 capsule; Refill: 0  2. Other depression, with emotional eating  Behavior modification techniques were discussed today to help Iwalani deal with her emotional/non-hunger eating behaviors. Sparkle agreed to increase bupropion SR to 150 mg BID with no refills. Orders and follow up as documented in patient record.   - buPROPion (WELLBUTRIN SR) 150 MG 12 hr tablet; Take 1 tablet (150 mg total) by mouth 2  (two) times daily.  Dispense: 60 tablet; Refill: 0  3. Class 2 severe obesity with serious comorbidity and body mass index (BMI) of 36.0 to 36.9 in adult, unspecified obesity type (HCC) Deriana is currently in the action stage of change. As such, her goal is to continue with weight loss efforts. She has agreed to the [employer].   Exercise goals: As is.  Behavioral modification strategies: increasing lean protein intake, decreasing simple carbohydrates, better snacking choices, emotional eating strategies, dealing with family or coworker sabotage and planning for success.  Gracelee has agreed to follow-up with our clinic in 3 weeks. She was informed of the importance of frequent follow-up visits to maximize her success with intensive lifestyle modifications for her multiple health conditions.   Objective:   Blood pressure 134/74, pulse 64, temperature 97.8 F (36.6 C), temperature source Oral, height 4\' 11"  (1.499 m), weight 179 lb (81.2 kg), SpO2 98 %. Body mass index is 36.15 kg/m.  General: Cooperative, alert, well developed, in no acute distress. HEENT: Conjunctivae and lids unremarkable. Cardiovascular: Regular rhythm.  Lungs: Normal work of breathing. Neurologic: No focal deficits.   Lab Results  Component Value Date   CREATININE 0.55 (L) 11/11/2019   BUN 7 (L) 11/11/2019   NA 134 11/11/2019   K 4.0 11/11/2019   CL 95 (L) 11/11/2019   CO2 27 11/11/2019   Lab Results  Component Value Date   ALT 18 11/11/2019   AST 19 11/11/2019   ALKPHOS 90 11/11/2019   BILITOT 0.5 11/11/2019   Lab  Results  Component Value Date   HGBA1C 5.1 06/08/2019   HGBA1C 5.1 12/24/2018   Lab Results  Component Value Date   INSULIN 8.5 11/11/2019   INSULIN 8.0 06/08/2019   INSULIN 11.9 12/24/2018   Lab Results  Component Value Date   TSH 0.461 12/24/2018   Lab Results  Component Value Date   CHOL 220 (H) 11/11/2019   HDL 72 11/11/2019   LDLCALC 136 (H) 11/11/2019   TRIG 67  11/11/2019   Lab Results  Component Value Date   WBC 4.9 11/11/2019   HGB 15.2 11/11/2019   HCT 44.7 11/11/2019   MCV 86 11/11/2019   PLT 278 11/11/2019   No results found for: IRON, TIBC, FERRITIN  Obesity Behavioral Intervention Documentation for Insurance:   Approximately 15 minutes were spent on the discussion below.  ASK: We discussed the diagnosis of obesity with [place] today and Maaliyah agreed to give [name] permission to discuss obesity behavioral modification therapy today.  ASSESS: Lannis has the diagnosis of obesity and her BMI today is 36.13. Shaelan is in the action stage of change.   ADVISE: Archisha was educated on the multiple health risks of obesity as well as the benefit of weight loss to improve her health. She was advised of the need for long term treatment and the importance of lifestyle modifications to improve her current health and to decrease her risk of future health problems.  AGREE: Multiple dietary modification options and treatment options were discussed and Tanayah agreed to follow the recommendations documented in the above note.  ARRANGE: Melis was educated on the importance of frequent visits to treat obesity as outlined per CMS and USPSTF guidelines and agreed to schedule her next follow up appointment today.  Attestation Statements:   Reviewed by clinician on day of visit: allergies, medications, problem list, medical history, surgical history, family history, social history, and previous encounter notes.   Wilhemena Durie, am acting as Location manager for [employer], FNP-C.  I have reviewed the above documentation for accuracy and completeness, and I agree with the above. - Georgianne Fick, FNP

## 2020-01-19 ENCOUNTER — Encounter (INDEPENDENT_AMBULATORY_CARE_PROVIDER_SITE_OTHER): Payer: Self-pay | Admitting: Family Medicine

## 2020-01-19 DIAGNOSIS — E559 Vitamin D deficiency, unspecified: Secondary | ICD-10-CM | POA: Insufficient documentation

## 2020-01-19 DIAGNOSIS — F329 Major depressive disorder, single episode, unspecified: Secondary | ICD-10-CM | POA: Insufficient documentation

## 2020-01-19 DIAGNOSIS — F32A Depression, unspecified: Secondary | ICD-10-CM | POA: Insufficient documentation

## 2020-02-10 ENCOUNTER — Other Ambulatory Visit (INDEPENDENT_AMBULATORY_CARE_PROVIDER_SITE_OTHER): Payer: Self-pay | Admitting: Family Medicine

## 2020-02-10 ENCOUNTER — Other Ambulatory Visit: Payer: Self-pay

## 2020-02-10 ENCOUNTER — Encounter (INDEPENDENT_AMBULATORY_CARE_PROVIDER_SITE_OTHER): Payer: Self-pay | Admitting: Family Medicine

## 2020-02-10 ENCOUNTER — Ambulatory Visit (INDEPENDENT_AMBULATORY_CARE_PROVIDER_SITE_OTHER): Payer: Medicare Other | Admitting: Family Medicine

## 2020-02-10 VITALS — BP 131/75 | HR 62 | Temp 98.2°F | Ht 59.0 in | Wt 174.0 lb

## 2020-02-10 DIAGNOSIS — E559 Vitamin D deficiency, unspecified: Secondary | ICD-10-CM | POA: Diagnosis not present

## 2020-02-10 DIAGNOSIS — F3289 Other specified depressive episodes: Secondary | ICD-10-CM | POA: Diagnosis not present

## 2020-02-10 DIAGNOSIS — Z6835 Body mass index (BMI) 35.0-35.9, adult: Secondary | ICD-10-CM

## 2020-02-10 MED ORDER — VITAMIN D (ERGOCALCIFEROL) 1.25 MG (50000 UNIT) PO CAPS: 50000.0000 [IU] | ORAL_CAPSULE | ORAL | 0 refills | Status: DC

## 2020-02-10 MED ORDER — BUPROPION HCL ER (SR) 150 MG PO TB12: 150.0000 mg | ORAL_TABLET | Freq: Two times a day (BID) | ORAL | 0 refills | Status: DC

## 2020-02-10 NOTE — Progress Notes (Signed)
Chief Complaint:   OBESITY Jennifer Tyler is here to discuss her progress with her obesity treatment plan along with follow-up of her obesity related diagnoses. Jennifer Tyler is on practicing portion control and making smarter food choices, such as increasing vegetables and decreasing simple carbohydrates and states she is following her eating plan approximately 70% of the time. Jennifer Tyler states she is walking for 30 minutes 3 times per week.  Today's visit was #: 22 Starting weight: 193 lbs Starting date: 12/24/2018 Today's weight: 174 lbs Today's date: 02/10/2020 Total lbs lost to date: 19 Total lbs lost since last in-office visit: 5  Interim History: Jennifer Tyler is sticking to her plan well. Her hunger is satisfied. She likes the [employer]. She drinks about 64 oz of water per day. She does not skip meals and eats all of the prescribed food. Her family is coming to visit this weekend.  Subjective:   1. Vitamin D deficiency [name]'s Vit D was low at 36.7. She is on prescription Vit D weekly. She denies fatigue.  2. Other depression, with emotional eating  [name]'s symptoms are well controlled with bupropion. She denies side effects.  Assessment/Plan:   1. Vitamin D deficiency Low Vitamin D level contributes to fatigue and are associated with obesity, breast, and colon cancer. We will refill prescription Vitamin D for 1 month. [name] will follow-up for routine testing of Vitamin D, at least 2-3 times per year to avoid over-replacement.  - Vitamin D, Ergocalciferol, (DRISDOL) 1.25 MG (50000 UNIT) CAPS capsule; Take 1 capsule (50,000 Units total) by mouth every 7 (seven) days.  Dispense: 4 capsule; Refill: 0  2. Other depression, with emotional eating  Behavior modification techniques were discussed today to help Jennifer Tyler deal with her emotional/non-hunger eating behaviors. We will refill bupropion SR for 1 month. Orders and follow up as documented in patient record.   - buPROPion (WELLBUTRIN SR) 150 MG 12 hr  tablet; Take 1 tablet (150 mg total) by mouth 2 (two) times daily.  Dispense: 60 tablet; Refill: 0  3. Class 2 severe obesity with serious comorbidity and body mass index (BMI) of 35.0 to 35.9 in adult, unspecified obesity type (HCC) Jennifer Tyler is currently in the action stage of change. As such, her goal is to continue with weight loss efforts. She has agreed to the [employer].   Exercise goals: Jennifer Tyler is to increase walking to 5 days per week.  Behavioral modification strategies: celebration eating strategies.  Jennifer Tyler has agreed to follow-up with our clinic in 3 weeks. She was informed of the importance of frequent follow-up visits to maximize her success with intensive lifestyle modifications for her multiple health conditions.   Objective:   Blood pressure 131/75, pulse 62, temperature 98.2 F (36.8 C), temperature source Oral, height 4\' 11"  (1.499 m), weight 174 lb (78.9 kg), SpO2 99 %. Body mass index is 35.14 kg/m.  General: Cooperative, alert, well developed, in no acute distress. HEENT: Conjunctivae and lids unremarkable. Cardiovascular: Regular rhythm.  Lungs: Normal work of breathing. Neurologic: No focal deficits.   Lab Results  Component Value Date   CREATININE 0.55 (L) 11/11/2019   BUN 7 (L) 11/11/2019   NA 134 11/11/2019   K 4.0 11/11/2019   CL 95 (L) 11/11/2019   CO2 27 11/11/2019   Lab Results  Component Value Date   ALT 18 11/11/2019   AST 19 11/11/2019   ALKPHOS 90 11/11/2019   BILITOT 0.5 11/11/2019   Lab Results  Component Value Date  HGBA1C 5.1 06/08/2019   HGBA1C 5.1 12/24/2018   Lab Results  Component Value Date   INSULIN 8.5 11/11/2019   INSULIN 8.0 06/08/2019   INSULIN 11.9 12/24/2018   Lab Results  Component Value Date   TSH 0.461 12/24/2018   Lab Results  Component Value Date   CHOL 220 (H) 11/11/2019   HDL 72 11/11/2019   LDLCALC 136 (H) 11/11/2019   TRIG 67 11/11/2019   Lab Results  Component Value Date   WBC 4.9 11/11/2019    HGB 15.2 11/11/2019   HCT 44.7 11/11/2019   MCV 86 11/11/2019   PLT 278 11/11/2019   No results found for: IRON, TIBC, FERRITIN  Obesity Behavioral Intervention Documentation for Insurance:   Approximately 15 minutes were spent on the discussion below.  ASK: We discussed the diagnosis of obesity with Jennifer Tyler today and [name] agreed to give [name] permission to discuss obesity behavioral modification therapy today.  ASSESS: Jennifer Tyler has the diagnosis of obesity and her BMI today is 35.13. Jennifer Tyler is in the action stage of change.   ADVISE: Jennifer Tyler was educated on the multiple health risks of obesity as well as the benefit of weight loss to improve her health. She was advised of the need for long term treatment and the importance of lifestyle modifications to improve her current health and to decrease her risk of future health problems.  AGREE: Multiple dietary modification options and treatment options were discussed and Jennifer Tyler agreed to follow the recommendations documented in the above note.  ARRANGE: Jennifer Tyler was educated on the importance of frequent visits to treat obesity as outlined per CMS and USPSTF guidelines and agreed to schedule her next follow up appointment today.  Attestation Statements:   Reviewed by clinician on day of visit: allergies, medications, problem list, medical history, surgical history, family history, social history, and previous encounter notes.   Wilhemena Durie, am acting as Location manager for [employer], FNP-C.  I have reviewed the above documentation for accuracy and completeness, and I agree with the above. -  Georgianne Fick, FNP

## 2020-03-02 ENCOUNTER — Encounter (INDEPENDENT_AMBULATORY_CARE_PROVIDER_SITE_OTHER): Payer: Self-pay | Admitting: Family Medicine

## 2020-03-02 ENCOUNTER — Ambulatory Visit (INDEPENDENT_AMBULATORY_CARE_PROVIDER_SITE_OTHER): Payer: Medicare Other | Admitting: Family Medicine

## 2020-03-02 ENCOUNTER — Other Ambulatory Visit: Payer: Self-pay

## 2020-03-02 ENCOUNTER — Other Ambulatory Visit (INDEPENDENT_AMBULATORY_CARE_PROVIDER_SITE_OTHER): Payer: Self-pay | Admitting: Family Medicine

## 2020-03-02 VITALS — BP 129/77 | HR 62 | Temp 97.8°F | Ht 59.0 in | Wt 173.0 lb

## 2020-03-02 DIAGNOSIS — Z6835 Body mass index (BMI) 35.0-35.9, adult: Secondary | ICD-10-CM

## 2020-03-02 DIAGNOSIS — E559 Vitamin D deficiency, unspecified: Secondary | ICD-10-CM | POA: Diagnosis not present

## 2020-03-02 DIAGNOSIS — F3289 Other specified depressive episodes: Secondary | ICD-10-CM

## 2020-03-02 MED ORDER — VITAMIN D (ERGOCALCIFEROL) 1.25 MG (50000 UNIT) PO CAPS: 50000.0000 [IU] | ORAL_CAPSULE | ORAL | 0 refills | Status: DC

## 2020-03-02 MED ORDER — BUPROPION HCL ER (SR) 150 MG PO TB12: 150.0000 mg | ORAL_TABLET | Freq: Two times a day (BID) | ORAL | 0 refills | Status: DC

## 2020-03-06 ENCOUNTER — Encounter (INDEPENDENT_AMBULATORY_CARE_PROVIDER_SITE_OTHER): Payer: Self-pay | Admitting: Family Medicine

## 2020-03-06 NOTE — Progress Notes (Signed)
Chief Complaint:   OBESITY Jennifer Tyler is here to discuss her progress with her obesity treatment plan along with follow-up of her obesity related diagnoses. Jennifer Tyler is on the [employer] and states she is following her eating plan approximately 70% of the time. Jennifer Tyler states she is walking and using stretch bands for 30 minutes 4 times per week.  Today's visit was #: 23 Starting weight: 193 lbs Starting date: 12/24/2018 Today's weight: 173 lbs Today's date: 03/02/2020 Total lbs lost to date: 20 Total lbs lost since last in-office visit: 1  Interim History: Jennifer Tyler notes she has been off the plan for several meals over the past few weeks. She is disappointed in herself for being off the plan. She tends to deviate from the plan at supper. She cooks food separately sometimes for her husband. She has lost 20 lbs over the past year.  Subjective:   1. Vitamin D deficiency Jennifer Tyler's last Vit D level was low at 36.7. She is on high dose Vit D.  2. Other depression, with emotional eating Jennifer Tyler notes her cravings are well controlled. She denies insomnia, but notes dry mouth. She is on bupropion BID.  Assessment/Plan:   1. Vitamin D deficiency Low Vitamin D level contributes to fatigue and are associated with obesity, breast, and colon cancer. We will refill prescription Vitamin D for 1 month. Jennifer Tyler will follow-up for routine testing of Vitamin D, at least 2-3 times per year to avoid over-replacement. We will recheck labs at her next visit.  - Vitamin D, Ergocalciferol, (DRISDOL) 1.25 MG (50000 UNIT) CAPS capsule; Take 1 capsule (50,000 Units total) by mouth every 7 (seven) days.  Dispense: 4 capsule; Refill: 0  2. Other depression, with emotional eating Behavior modification techniques were discussed today to help Jennifer Tyler deal with her emotional/non-hunger eating behaviors. We will refill bupropion for 1 month. Orders and follow up as documented in patient record.   - buPROPion (WELLBUTRIN SR) 150 MG 12  hr tablet; Take 1 tablet (150 mg total) by mouth 2 (two) times daily.  Dispense: 60 tablet; Refill: 0  3. Class 2 severe obesity with serious comorbidity and body mass index (BMI) of 35.0 to 35.9 in adult, unspecified obesity type (HCC) Merced is currently in the action stage of change. As such, her goal is to continue with weight loss efforts. She has agreed to the [employer].   Reassurance was provided to the patient today.  Exercise goals: As is.  Behavioral modification strategies: decreasing simple carbohydrates and planning for success.  Jennifer Tyler has agreed to follow-up with our clinic in 3 weeks. She was informed of the importance of frequent follow-up visits to maximize her success with intensive lifestyle modifications for her multiple health conditions.   Objective:   Blood pressure 129/77, pulse 62, temperature 97.8 F (36.6 C), temperature source Oral, height 4\' 11"  (1.499 m), weight 173 lb (78.5 kg), SpO2 99 %. Body mass index is 34.94 kg/m.  General: Cooperative, alert, well developed, in no acute distress. HEENT: Conjunctivae and lids unremarkable. Cardiovascular: Regular rhythm.  Lungs: Normal work of breathing. Neurologic: No focal deficits.   Lab Results  Component Value Date   CREATININE 0.55 (L) 11/11/2019   BUN 7 (L) 11/11/2019   NA 134 11/11/2019   K 4.0 11/11/2019   CL 95 (L) 11/11/2019   CO2 27 11/11/2019   Lab Results  Component Value Date   ALT 18 11/11/2019   AST 19 11/11/2019   ALKPHOS 90 11/11/2019  BILITOT 0.5 11/11/2019   Lab Results  Component Value Date   HGBA1C 5.1 06/08/2019   HGBA1C 5.1 12/24/2018   Lab Results  Component Value Date   INSULIN 8.5 11/11/2019   INSULIN 8.0 06/08/2019   INSULIN 11.9 12/24/2018   Lab Results  Component Value Date   TSH 0.461 12/24/2018   Lab Results  Component Value Date   CHOL 220 (H) 11/11/2019   HDL 72 11/11/2019   LDLCALC 136 (H) 11/11/2019   TRIG 67 11/11/2019   Lab Results    Component Value Date   WBC 4.9 11/11/2019   HGB 15.2 11/11/2019   HCT 44.7 11/11/2019   MCV 86 11/11/2019   PLT 278 11/11/2019   No results found for: IRON, TIBC, FERRITIN  Attestation Statements:   Reviewed by clinician on day of visit: allergies, medications, problem list, medical history, surgical history, family history, social history, and previous encounter notes.   Wilhemena Durie, am acting as Location manager for [employer], FNP-C.  I have reviewed the above documentation for accuracy and completeness, and I agree with the above. -  Georgianne Fick, FNP

## 2020-03-23 ENCOUNTER — Ambulatory Visit (INDEPENDENT_AMBULATORY_CARE_PROVIDER_SITE_OTHER): Payer: Medicare Other | Admitting: Family Medicine

## 2020-03-30 ENCOUNTER — Encounter (INDEPENDENT_AMBULATORY_CARE_PROVIDER_SITE_OTHER): Payer: Self-pay | Admitting: Family Medicine

## 2020-03-30 ENCOUNTER — Ambulatory Visit (INDEPENDENT_AMBULATORY_CARE_PROVIDER_SITE_OTHER): Payer: Medicare Other | Admitting: Family Medicine

## 2020-03-30 ENCOUNTER — Other Ambulatory Visit: Payer: Self-pay

## 2020-03-30 VITALS — BP 133/76 | HR 62 | Temp 98.3°F | Ht 59.0 in | Wt 174.0 lb

## 2020-03-30 DIAGNOSIS — E7849 Other hyperlipidemia: Secondary | ICD-10-CM

## 2020-03-30 DIAGNOSIS — E559 Vitamin D deficiency, unspecified: Secondary | ICD-10-CM

## 2020-03-30 DIAGNOSIS — E8881 Metabolic syndrome: Secondary | ICD-10-CM

## 2020-03-30 DIAGNOSIS — Z6835 Body mass index (BMI) 35.0-35.9, adult: Secondary | ICD-10-CM

## 2020-03-30 DIAGNOSIS — E66812 Obesity, class 2: Secondary | ICD-10-CM

## 2020-03-30 DIAGNOSIS — E88819 Insulin resistance, unspecified: Secondary | ICD-10-CM

## 2020-03-30 DIAGNOSIS — F3289 Other specified depressive episodes: Secondary | ICD-10-CM

## 2020-03-30 MED ORDER — BUPROPION HCL ER (SR) 200 MG PO TB12: 200.0000 mg | ORAL_TABLET | Freq: Two times a day (BID) | ORAL | 0 refills | Status: DC

## 2020-03-30 NOTE — Progress Notes (Signed)
Chief Complaint:   OBESITY Jennifer Tyler is here to discuss her progress with her obesity treatment plan along with follow-up of her obesity related diagnoses. Jennifer Tyler is on the [employer] and states she is following her eating plan approximately 50% of the time. Jennifer Tyler states she is walking for 30 minutes 3 times per week.  Today's visit was #: 24 Starting weight: 193 lbs Starting date: 12/24/2018 Today's weight: 174 lbs Today's date: 03/30/2020 Total lbs lost to date: 19 Total lbs lost since last in-office visit: 0  Interim History: Overa is struggling with sticking to the plan over the past 6 weeks. Her husband keeps tempting foods such as chips at home. She notes her meals are mostly on the plan but then she tends to snack on carbs.  Subjective:   1. Other hyperlipidemia Jennifer Tyler has hyperlipidemia and has been trying to improve her cholesterol levels with intensive lifestyle modification including a low saturated fat diet, exercise and weight loss. Last LDL was elevated at 136, HDL was good at 72, and triglyceride She denies any chest pain, claudication or myalgias.  Lab Results  Component Value Date   ALT 18 11/11/2019   AST 19 11/11/2019   ALKPHOS 90 11/11/2019   BILITOT 0.5 11/11/2019   Lab Results  Component Value Date   CHOL 220 (H) 11/11/2019   HDL 72 11/11/2019   LDLCALC 136 (H) 11/11/2019   TRIG 67 11/11/2019   2. Vitamin D deficiency Jennifer Tyler's last Vit D level was low at 36.7. She is on prescription Vit D.  3. Insulin resistance Jennifer Tyler has a diagnosis of insulin resistance based on her elevated fasting insulin level >5. Jennifer Tyler is not on metformin, and she denies polyphagia. She continues to work on diet and exercise to decrease her risk of diabetes.  Lab Results  Component Value Date   INSULIN 8.5 11/11/2019   INSULIN 8.0 06/08/2019   INSULIN 11.9 12/24/2018   Lab Results  Component Value Date   HGBA1C 5.1 06/08/2019   4. Other depression, with emotional eating Jennifer Tyler  is struggling with cravings. She notes bupropion helped at first but not now.  Assessment/Plan:   1. Other hyperlipidemia Cardiovascular risk and specific lipid/LDL goals reviewed. We discussed several lifestyle modifications today and Jennifer Tyler will continue to work on diet, exercise and weight loss efforts. We will check labs today.  - Comprehensive metabolic panel - Lipid Panel With LDL/HDL Ratio  2. Vitamin D deficiency Low Vitamin D level contributes to fatigue and are associated with obesity, breast, and colon cancer. Saniah agreed to continue taking prescription Vitamin D 50,000 IU every week and will follow-up for routine testing of Vitamin D, at least 2-3 times per year to avoid over-replacement. We will check labs today.  - VITAMIN D 25 Hydroxy (Vit-D Deficiency, Fractures)  3. Insulin resistance Jennifer Tyler will continue to work on weight loss, exercise, and decreasing simple carbohydrates to help decrease the risk of diabetes. We will check labs today. Jennifer Tyler agreed to follow-up with [name] as directed to closely monitor her progress.  - Hemoglobin A1c - Insulin, random  4. Other depression, with emotional eating Behavior modification techniques were discussed today to help Jennifer Tyler deal with her emotional/non-hunger eating behaviors. Jennifer Tyler agreed to increase bupropion to 200 mg BID #60 with no refills. She will take 200 mg in the AM and 150 mg in the PM until her 150 mg is used up and then start 200 mg twice daily.   5. Class 2 severe  obesity with serious comorbidity and body mass index (BMI) of 35.0 to 35.9 in adult, unspecified obesity type (HCC) Jennifer Tyler is currently in the action stage of change. As such, her goal is to continue with weight loss efforts. She has agreed to the [employer].   Additional breakfast options given.  Exercise goals: Jennifer Tyler will increase walking to 5 times per week.  Behavioral modification strategies: decreasing simple carbohydrates, dealing with family or coworker  sabotage and celebration eating strategies.  Jennifer Tyler has agreed to follow-up with our clinic in 3 weeks. She was informed of the importance of frequent follow-up visits to maximize her success with intensive lifestyle modifications for her multiple health conditions.   Jennifer Tyler was informed we would discuss her lab results at her next visit unless there is a critical issue that needs to be addressed sooner. Jennifer Tyler agreed to keep her next visit at the agreed upon time to discuss these results.  Objective:   Blood pressure 133/76, pulse 62, temperature 98.3 F (36.8 C), temperature source Oral, height 4\' 11"  (1.499 m), weight 174 lb (78.9 kg), SpO2 97 %. Body mass index is 35.14 kg/m.  General: Cooperative, alert, well developed, in no acute distress. HEENT: Conjunctivae and lids unremarkable. Cardiovascular: Regular rhythm.  Lungs: Normal work of breathing. Neurologic: No focal deficits.   Lab Results  Component Value Date   CREATININE 0.55 (L) 11/11/2019   BUN 7 (L) 11/11/2019   NA 134 11/11/2019   K 4.0 11/11/2019   CL 95 (L) 11/11/2019   CO2 27 11/11/2019   Lab Results  Component Value Date   ALT 18 11/11/2019   AST 19 11/11/2019   ALKPHOS 90 11/11/2019   BILITOT 0.5 11/11/2019   Lab Results  Component Value Date   HGBA1C 5.1 06/08/2019   HGBA1C 5.1 12/24/2018   Lab Results  Component Value Date   INSULIN 8.5 11/11/2019   INSULIN 8.0 06/08/2019   INSULIN 11.9 12/24/2018   Lab Results  Component Value Date   TSH 0.461 12/24/2018   Lab Results  Component Value Date   CHOL 220 (H) 11/11/2019   HDL 72 11/11/2019   LDLCALC 136 (H) 11/11/2019   TRIG 67 11/11/2019   Lab Results  Component Value Date   WBC 4.9 11/11/2019   HGB 15.2 11/11/2019   HCT 44.7 11/11/2019   MCV 86 11/11/2019   PLT 278 11/11/2019   No results found for: IRON, TIBC, FERRITIN  Obesity Behavioral Intervention Documentation for Insurance:   Approximately 15 minutes were spent on the  discussion below.  ASK: We discussed the diagnosis of obesity with Jennifer Tyler today and Jennifer Tyler agreed to give [name] permission to discuss obesity behavioral modification therapy today.  ASSESS: Jennifer Tyler has the diagnosis of obesity and her BMI today is 35.13. Jennifer Tyler is in the action stage of change.   ADVISE: Jennifer Tyler was educated on the multiple health risks of obesity as well as the benefit of weight loss to improve her health. She was advised of the need for long term treatment and the importance of lifestyle modifications to improve her current health and to decrease her risk of future health problems.  AGREE: Multiple dietary modification options and treatment options were discussed and Jennifer Tyler agreed to follow the recommendations documented in the above note.  ARRANGE: Natayah was educated on the importance of frequent visits to treat obesity as outlined per CMS and USPSTF guidelines and agreed to schedule her next follow up appointment today.  Attestation Statements:   Reviewed  by clinician on day of visit: allergies, medications, problem list, medical history, surgical history, family history, social history, and previous encounter notes.   Wilhemena Durie, am acting as Location manager for [employer], FNP-C.  I have reviewed the above documentation for accuracy and completeness, and I agree with the above. -  Georgianne Fick, FNP .mwm

## 2020-03-31 LAB — LIPID PANEL WITH LDL/HDL RATIO
Cholesterol, Total: 200 mg/dL — ABNORMAL HIGH (ref 100–199)
HDL: 70 mg/dL (ref >39)
LDL Chol Calc (NIH): 117 mg/dL — ABNORMAL HIGH (ref 0–99)
LDL/HDL Ratio: 1.7 ratio (ref 0.0–3.2)
Triglycerides: 72 mg/dL (ref 0–149)
VLDL Cholesterol Cal: 13 mg/dL (ref 5–40)

## 2020-03-31 LAB — COMPREHENSIVE METABOLIC PANEL
ALT: 16 IU/L (ref 0–32)
AST: 20 IU/L (ref 0–40)
Albumin/Globulin Ratio: 1.6 (ref 1.2–2.2)
Albumin: 4.1 g/dL (ref 3.8–4.8)
Alkaline Phosphatase: 86 IU/L (ref 48–121)
BUN/Creatinine Ratio: 18 (ref 12–28)
BUN: 11 mg/dL (ref 8–27)
Bilirubin Total: 0.3 mg/dL (ref 0.0–1.2)
CO2: 26 mmol/L (ref 20–29)
Calcium: 9.7 mg/dL (ref 8.7–10.3)
Chloride: 98 mmol/L (ref 96–106)
Creatinine, Ser: 0.62 mg/dL (ref 0.57–1.00)
GFR calc Af Amer: 107 mL/min/{1.73_m2} (ref >59)
GFR calc non Af Amer: 93 mL/min/{1.73_m2} (ref >59)
Globulin, Total: 2.5 g/dL (ref 1.5–4.5)
Glucose: 81 mg/dL (ref 65–99)
Potassium: 4.5 mmol/L (ref 3.5–5.2)
Sodium: 137 mmol/L (ref 134–144)
Total Protein: 6.6 g/dL (ref 6.0–8.5)

## 2020-03-31 LAB — HEMOGLOBIN A1C
Est. average glucose Bld gHb Est-mCnc: 103 mg/dL
Hgb A1c MFr Bld: 5.2 % (ref 4.8–5.6)

## 2020-03-31 LAB — VITAMIN D 25 HYDROXY (VIT D DEFICIENCY, FRACTURES): Vit D, 25-Hydroxy: 31.4 ng/mL (ref 30.0–100.0)

## 2020-03-31 LAB — INSULIN, RANDOM: INSULIN: 8.4 u[IU]/mL (ref 2.6–24.9)

## 2020-04-20 ENCOUNTER — Ambulatory Visit (INDEPENDENT_AMBULATORY_CARE_PROVIDER_SITE_OTHER): Payer: Medicare Other | Admitting: Family Medicine

## 2020-04-25 ENCOUNTER — Other Ambulatory Visit (INDEPENDENT_AMBULATORY_CARE_PROVIDER_SITE_OTHER): Payer: Self-pay | Admitting: Family Medicine

## 2020-04-25 DIAGNOSIS — E559 Vitamin D deficiency, unspecified: Secondary | ICD-10-CM

## 2020-05-01 ENCOUNTER — Encounter (INDEPENDENT_AMBULATORY_CARE_PROVIDER_SITE_OTHER): Payer: Self-pay | Admitting: Family Medicine

## 2020-05-01 ENCOUNTER — Ambulatory Visit (INDEPENDENT_AMBULATORY_CARE_PROVIDER_SITE_OTHER): Payer: Medicare Other | Admitting: Family Medicine

## 2020-05-01 ENCOUNTER — Other Ambulatory Visit: Payer: Self-pay

## 2020-05-01 VITALS — BP 136/79 | HR 60 | Temp 98.1°F | Ht 59.0 in | Wt 175.0 lb

## 2020-05-01 DIAGNOSIS — Z6835 Body mass index (BMI) 35.0-35.9, adult: Secondary | ICD-10-CM | POA: Diagnosis not present

## 2020-05-01 DIAGNOSIS — I10 Essential (primary) hypertension: Secondary | ICD-10-CM

## 2020-05-01 DIAGNOSIS — F3289 Other specified depressive episodes: Secondary | ICD-10-CM

## 2020-05-01 DIAGNOSIS — E559 Vitamin D deficiency, unspecified: Secondary | ICD-10-CM

## 2020-05-01 MED ORDER — BUPROPION HCL ER (SR) 200 MG PO TB12: 200.0000 mg | ORAL_TABLET | Freq: Two times a day (BID) | ORAL | 0 refills | Status: DC

## 2020-05-02 NOTE — Progress Notes (Signed)
Chief Complaint:   OBESITY Jazel is here to discuss her progress with her obesity treatment plan along with follow-up of her obesity related diagnoses. Nanci is on the [employer] and states she is following her eating plan approximately 30% of the time. Randy states she is doing 0 minutes 0 times per week.  Today's visit was #: 25 Starting weight: 193 lbs Starting date: 12/24/2018 Today's weight: 175 lbs Today's date: 05/01/2020 Total lbs lost to date: 18 Total lbs lost since last in-office visit: 0  Interim History: Kimbly has kept her grand kids for the last 3 weeks and she has been off the plan. She had food in the house that she does not usually keep. She is starting back on the plan tomorrow.  Subjective:   1. Essential hypertension Joyann's blood pressure is high at her first check today (181/78 and second check 136/79). She is compliant with her blood pressure medications. Cardiovascular ROS: no chest pain or dyspnea on exertion. I discussed labs with the patient today.  BP Readings from Last 3 Encounters:  05/01/20 136/79  03/30/20 133/76  03/02/20 129/77   Lab Results  Component Value Date   CREATININE 0.62 03/30/2020   CREATININE 0.55 (L) 11/11/2019   CREATININE 0.51 (L) 06/08/2019   2. Vitamin D deficiency Zurisadai's Vit D level decreased despite weekly Vit D prescription. Her Vit D level decreased from 36 to 31. She is compliant with prescription Vit D. I discussed labs with the patient today.  3. Other depression, with emotional eating Sherelle notes bupropion helps with cravings. Her mood is stable.  Assessment/Plan:   1. Essential hypertension Ismay will continue her meal plan, and will continue working on healthy weight loss and exercise to improve blood pressure control. We will watch for signs of hypotension as she continues her lifestyle modifications.   2. Vitamin D deficiency Low Vitamin D level contributes to fatigue and are associated with obesity,  breast, and colon cancer. Cerys agreed to increase prescription Vitamin D to 50,000 IU every 3 days with no refills. She will follow-up for routine testing of Vitamin D, at least 2-3 times per year to avoid over-replacement.  3. Other depression, with emotional eating Behavior modification techniques were discussed today to help Aleenah deal with her emotional/non-hunger eating behaviors. We will refill bupropion for 1 month. Orders and follow up as documented in patient record.   - buPROPion (WELLBUTRIN SR) 200 MG 12 hr tablet; Take 1 tablet (200 mg total) by mouth 2 (two) times daily.  Dispense: 60 tablet; Refill: 0  4. Class 2 severe obesity with serious comorbidity and body mass index (BMI) of 35.0 to 35.9 in adult, unspecified obesity type (HCC) Claris is currently in the action stage of change. As such, her goal is to continue with weight loss efforts. She has agreed to the [employer].   Exercise goals: All adults should avoid inactivity. Some physical activity is better than none, and adults who participate in any amount of physical activity gain some health benefits.  Behavioral modification strategies: increasing lean protein intake, decreasing simple carbohydrates and keeping healthy foods in the home.  Lamara has agreed to follow-up with our clinic in 4 weeks. She was informed of the importance of frequent follow-up visits to maximize her success with intensive lifestyle modifications for her multiple health conditions.   Objective:   Blood pressure 136/79, pulse 60, temperature 98.1 F (36.7 C), temperature source Other (Comment), height 4\' 11"  (1.499 m), weight  175 lb (79.4 kg), SpO2 99 %. Body mass index is 35.35 kg/m.  General: Cooperative, alert, well developed, in no acute distress. HEENT: Conjunctivae and lids unremarkable. Cardiovascular: Regular rhythm.  Lungs: Normal work of breathing. Neurologic: No focal deficits.   Lab Results  Component Value Date   CREATININE  0.62 03/30/2020   BUN 11 03/30/2020   NA 137 03/30/2020   K 4.5 03/30/2020   CL 98 03/30/2020   CO2 26 03/30/2020   Lab Results  Component Value Date   ALT 16 03/30/2020   AST 20 03/30/2020   ALKPHOS 86 03/30/2020   BILITOT 0.3 03/30/2020   Lab Results  Component Value Date   HGBA1C 5.2 03/30/2020   HGBA1C 5.1 06/08/2019   HGBA1C 5.1 12/24/2018   Lab Results  Component Value Date   INSULIN 8.4 03/30/2020   INSULIN 8.5 11/11/2019   INSULIN 8.0 06/08/2019   INSULIN 11.9 12/24/2018   Lab Results  Component Value Date   TSH 0.461 12/24/2018   Lab Results  Component Value Date   CHOL 200 (H) 03/30/2020   HDL 70 03/30/2020   LDLCALC 117 (H) 03/30/2020   TRIG 72 03/30/2020   Lab Results  Component Value Date   WBC 4.9 11/11/2019   HGB 15.2 11/11/2019   HCT 44.7 11/11/2019   MCV 86 11/11/2019   PLT 278 11/11/2019   No results found for: IRON, TIBC, FERRITIN  Obesity Behavioral Intervention Documentation for Insurance:   Approximately 15 minutes were spent on the discussion below.  ASK: We discussed the diagnosis of obesity with Hagen today and Stachia agreed to give [name] permission to discuss obesity behavioral modification therapy today.  ASSESS: Hajira has the diagnosis of obesity and her BMI today is 35.33. Shanikwa is in the action stage of change.   ADVISE: Candus was educated on the multiple health risks of obesity as well as the benefit of weight loss to improve her health. She was advised of the need for long term treatment and the importance of lifestyle modifications to improve her current health and to decrease her risk of future health problems.  AGREE: Multiple dietary modification options and treatment options were discussed and Yaslene agreed to follow the recommendations documented in the above note.  ARRANGE: Keta was educated on the importance of frequent visits to treat obesity as outlined per CMS and USPSTF guidelines and agreed to schedule her next follow  up appointment today.  Attestation Statements:   Reviewed by clinician on day of visit: allergies, medications, problem list, medical history, surgical history, family history, social history, and previous encounter notes.   Wilhemena Durie, am acting as Location manager for [employer], FNP-C.  I have reviewed the above documentation for accuracy and completeness, and I agree with the above. -  Georgianne Fick, FNP

## 2020-05-03 ENCOUNTER — Encounter (INDEPENDENT_AMBULATORY_CARE_PROVIDER_SITE_OTHER): Payer: Self-pay | Admitting: Family Medicine

## 2020-05-04 ENCOUNTER — Telehealth (INDEPENDENT_AMBULATORY_CARE_PROVIDER_SITE_OTHER): Payer: Self-pay | Admitting: Family Medicine

## 2020-05-04 ENCOUNTER — Other Ambulatory Visit (INDEPENDENT_AMBULATORY_CARE_PROVIDER_SITE_OTHER): Payer: Self-pay | Admitting: Family Medicine

## 2020-05-04 DIAGNOSIS — E559 Vitamin D deficiency, unspecified: Secondary | ICD-10-CM

## 2020-05-04 MED ORDER — VITAMIN D (ERGOCALCIFEROL) 1.25 MG (50000 UNIT) PO CAPS: 50000.0000 [IU] | ORAL_CAPSULE | ORAL | 0 refills | Status: DC

## 2020-05-04 NOTE — Telephone Encounter (Signed)
Patient states she did not receive her Vit D.  She attempted to send message via My Chart but wouldn't go through.  Her pharmacy is [facility] on [address].  Patient is aware that it might be Tuesday, 7/6 before addressed.

## 2020-05-04 NOTE — Telephone Encounter (Signed)
Call placed to patient she is Rx will be sent to pharmacy

## 2020-05-29 ENCOUNTER — Other Ambulatory Visit: Payer: Self-pay

## 2020-05-29 ENCOUNTER — Encounter (INDEPENDENT_AMBULATORY_CARE_PROVIDER_SITE_OTHER): Payer: Self-pay | Admitting: Family Medicine

## 2020-05-29 ENCOUNTER — Other Ambulatory Visit (INDEPENDENT_AMBULATORY_CARE_PROVIDER_SITE_OTHER): Payer: Self-pay | Admitting: Family Medicine

## 2020-05-29 ENCOUNTER — Ambulatory Visit (INDEPENDENT_AMBULATORY_CARE_PROVIDER_SITE_OTHER): Payer: Medicare Other | Admitting: Family Medicine

## 2020-05-29 VITALS — BP 136/76 | HR 67 | Temp 97.7°F | Ht 59.0 in | Wt 172.0 lb

## 2020-05-29 DIAGNOSIS — Z6834 Body mass index (BMI) 34.0-34.9, adult: Secondary | ICD-10-CM | POA: Diagnosis not present

## 2020-05-29 DIAGNOSIS — E559 Vitamin D deficiency, unspecified: Secondary | ICD-10-CM | POA: Diagnosis not present

## 2020-05-29 DIAGNOSIS — E669 Obesity, unspecified: Secondary | ICD-10-CM

## 2020-05-29 DIAGNOSIS — F3289 Other specified depressive episodes: Secondary | ICD-10-CM

## 2020-05-29 MED ORDER — VITAMIN D (ERGOCALCIFEROL) 1.25 MG (50000 UNIT) PO CAPS: 50000.0000 [IU] | ORAL_CAPSULE | ORAL | 0 refills | Status: DC

## 2020-05-29 MED ORDER — BUPROPION HCL ER (SR) 200 MG PO TB12: 200.0000 mg | ORAL_TABLET | Freq: Two times a day (BID) | ORAL | 0 refills | Status: DC

## 2020-05-29 NOTE — Progress Notes (Signed)
Chief Complaint:   OBESITY Jennifer Tyler is here to discuss her progress with her obesity treatment plan along with follow-up of her obesity related diagnoses. Jennifer Tyler is on the [employer] and states she is following her eating plan approximately 50% of the time. Jennifer Tyler states she is doing 0 minutes 0 times per week.  Today's visit was #: 30 Starting weight: 193 lbs Starting date: 12/24/2018 Today's weight: 172 lbs Today's date: 05/29/2020 Total lbs lost to date: 21 Total lbs lost since last in-office visit: 3  Interim History: Jennifer Tyler had cataract surgery on 05/19/2020 and she has taken a break from exercise. She will be having the other eye done next week. She has been eating lunch off the plan about 3 days per week. She says she knows what she should eat but then has something else because she wants it.  Subjective:   1. Vitamin D deficiency Jennifer Tyler's last Vit D level was not at goal. She is on Vit D every 3 days.  2. Other depression, with emotional eating Jennifer Tyler is on bupropion 200 mg BID. She denies cravings and her mood is stable.  Assessment/Plan:   1. Vitamin D deficiency Low Vitamin D level contributes to fatigue and are associated with obesity, breast, and colon cancer. We will refill prescription Vitamin D for 1 month. Jennifer Tyler will follow-up for routine testing of Vitamin D, at least 2-3 times per year to avoid over-replacement.  - Vitamin D, Ergocalciferol, (DRISDOL) 1.25 MG (50000 UNIT) CAPS capsule; Take 1 capsule (50,000 Units total) by mouth every 3 (three) days.  Dispense: 10 capsule; Refill: 0  2. Other depression, with emotional eating Behavior modification techniques were discussed today to help Jennifer Tyler deal with her emotional/non-hunger eating behaviors. We will refill bupropion for 1 month. Orders and follow up as documented in patient record.   - buPROPion (WELLBUTRIN SR) 200 MG 12 hr tablet; Take 1 tablet (200 mg total) by mouth 2 (two) times daily.  Dispense: 60 tablet;  Refill: 0  3. Class 1 obesity with serious comorbidity and body mass index (BMI) of 34.0 to 34.9 in adult, unspecified obesity type Jennifer Tyler is currently in the action stage of change. As such, her goal is to continue with weight loss efforts. She has agreed to the Category 2 Plan or the [place].   Handout given today: Eating Out.  Exercise goals: Jennifer Tyler will go back to the gym after she has recovered from her next cataract surgery. .  Behavioral modification strategies: increasing lean protein intake and meal planning and cooking strategies.  Jennifer Tyler has agreed to follow-up with our clinic in 3 weeks. She was informed of the importance of frequent follow-up visits to maximize her success with intensive lifestyle modifications for her multiple health conditions.   Objective:   Blood pressure (!) 136/76, pulse 67, temperature 97.7 F (36.5 C), temperature source Oral, height 4\' 11"  (1.499 m), weight 172 lb (78 kg), SpO2 98 %. Body mass index is 34.74 kg/m.  General: Cooperative, alert, well developed, in no acute distress. HEENT: Conjunctivae and lids unremarkable. Cardiovascular: Regular rhythm.  Lungs: Normal work of breathing. Neurologic: No focal deficits.   Lab Results  Component Value Date   CREATININE 0.62 03/30/2020   BUN 11 03/30/2020   NA 137 03/30/2020   K 4.5 03/30/2020   CL 98 03/30/2020   CO2 26 03/30/2020   Lab Results  Component Value Date   ALT 16 03/30/2020   AST 20 03/30/2020   ALKPHOS  86 03/30/2020   BILITOT 0.3 03/30/2020   Lab Results  Component Value Date   HGBA1C 5.2 03/30/2020   HGBA1C 5.1 06/08/2019   HGBA1C 5.1 12/24/2018   Lab Results  Component Value Date   INSULIN 8.4 03/30/2020   INSULIN 8.5 11/11/2019   INSULIN 8.0 06/08/2019   INSULIN 11.9 12/24/2018   Lab Results  Component Value Date   TSH 0.461 12/24/2018   Lab Results  Component Value Date   CHOL 200 (H) 03/30/2020   HDL 70 03/30/2020   LDLCALC 117 (H) 03/30/2020    TRIG 72 03/30/2020   Lab Results  Component Value Date   WBC 4.9 11/11/2019   HGB 15.2 11/11/2019   HCT 44.7 11/11/2019   MCV 86 11/11/2019   PLT 278 11/11/2019   No results found for: IRON, TIBC, FERRITIN  Obesity Behavioral Intervention Documentation for Insurance:   Approximately 15 minutes were spent on the discussion below.  ASK: We discussed the diagnosis of obesity with Jennifer Tyler today and Jennifer Tyler agreed to give [name] permission to discuss obesity behavioral modification therapy today.  ASSESS: Jennifer Tyler has the diagnosis of obesity and her BMI today is 34.72. Jennifer Tyler is in the action stage of change.   ADVISE: Jennifer Tyler was educated on the multiple health risks of obesity as well as the benefit of weight loss to improve her health. She was advised of the need for long term treatment and the importance of lifestyle modifications to improve her current health and to decrease her risk of future health problems.  AGREE: Multiple dietary modification options and treatment options were discussed and Jennifer Tyler agreed to follow the recommendations documented in the above note.  ARRANGE: Jennifer Tyler was educated on the importance of frequent visits to treat obesity as outlined per CMS and USPSTF guidelines and agreed to schedule her next follow up appointment today.  Attestation Statements:   Reviewed by clinician on day of visit: allergies, medications, problem list, medical history, surgical history, family history, social history, and previous encounter notes.   Wilhemena Durie, am acting as Location manager for [employer], FNP-C.  I have reviewed the above documentation for accuracy and completeness, and I agree with the above. -  Georgianne Fick, FNP

## 2020-06-19 ENCOUNTER — Other Ambulatory Visit: Payer: Self-pay

## 2020-06-19 ENCOUNTER — Ambulatory Visit (INDEPENDENT_AMBULATORY_CARE_PROVIDER_SITE_OTHER): Payer: Medicare Other | Admitting: Family Medicine

## 2020-06-19 ENCOUNTER — Encounter (INDEPENDENT_AMBULATORY_CARE_PROVIDER_SITE_OTHER): Payer: Self-pay | Admitting: Family Medicine

## 2020-06-19 VITALS — BP 130/75 | HR 62 | Temp 98.2°F | Ht 59.0 in | Wt 174.0 lb

## 2020-06-19 DIAGNOSIS — E8881 Metabolic syndrome: Secondary | ICD-10-CM | POA: Diagnosis not present

## 2020-06-19 DIAGNOSIS — Z6835 Body mass index (BMI) 35.0-35.9, adult: Secondary | ICD-10-CM

## 2020-06-19 DIAGNOSIS — E559 Vitamin D deficiency, unspecified: Secondary | ICD-10-CM | POA: Diagnosis not present

## 2020-06-19 MED ORDER — VITAMIN D (ERGOCALCIFEROL) 1.25 MG (50000 UNIT) PO CAPS: 50000.0000 [IU] | ORAL_CAPSULE | ORAL | 0 refills | Status: DC

## 2020-06-19 NOTE — Progress Notes (Signed)
Chief Complaint:   OBESITY Jennifer Tyler is here to discuss her progress with her obesity treatment plan along with follow-up of her obesity related diagnoses. Jennifer Tyler is on the Category 2 Plan or the [place] and states she is following her eating plan approximately 70% of the time. Jennifer Tyler states she is walking and at the gym for 45 minutes 3 times per week.  Today's visit was #: 62 Starting weight: 193 lbs Starting date: 12/25/2019 Today's weight: 174 lbs Today's date: 06/19/2020 Total lbs lost to date: 19 Total lbs lost since last in-office visit: 0  Interim History: Jennifer Tyler notes some celebration eating recently. She made a pound cake for her husband's birthday and ate some. She is now back on the plan. She is eating all of the protein. She has now gone back to the gym. She is status post cataract surgery and wanted to hold on exercise.  Subjective:   1. Insulin resistance Jennifer Tyler has a diagnosis of insulin resistance based on her elevated fasting insulin level >5. She denies polyphagia, and she is not on metformin. She continues to work on diet and exercise to decrease her risk of diabetes.  Lab Results  Component Value Date   INSULIN 8.4 03/30/2020   INSULIN 8.5 11/11/2019   INSULIN 8.0 06/08/2019   INSULIN 11.9 12/24/2018   Lab Results  Component Value Date   HGBA1C 5.2 03/30/2020   2. Vitamin D deficiency Jennifer Tyler's last Vit D was low at 31.4. She is on prescription Vit D.  Assessment/Plan:   1. Insulin resistance Jennifer Tyler will continue her meal plan, and will continue to work on weight loss, exercise, and decreasing simple carbohydrates to help decrease the risk of diabetes. Jennifer Tyler agreed to follow-up with [name] as directed to closely monitor her progress.  2. Vitamin D deficiency Low Vitamin D level contributes to fatigue and are associated with obesity, breast, and colon cancer. We will refill prescription Vitamin D for 1 month. Jennifer Tyler will follow-up for routine testing of Vitamin D, at  least 2-3 times per year to avoid over-replacement.  - Vitamin D, Ergocalciferol, (DRISDOL) 1.25 MG (50000 UNIT) CAPS capsule; Take 1 capsule (50,000 Units total) by mouth every 3 (three) days.  Dispense: 10 capsule; Refill: 0  3. Class 2 severe obesity with serious comorbidity and body mass index (BMI) of 35.0 to 35.9 in adult, unspecified obesity type (HCC) Jennifer Tyler is currently in the action stage of change. As such, her goal is to continue with weight loss efforts. She has agreed to the Jennifer Tyler.   Exercise goals: As is.  Behavioral modification strategies: decreasing simple carbohydrates and planning for success.  Jennifer Tyler has agreed to follow-up with our clinic in 2 weeks. She was informed of the importance of frequent follow-up visits to maximize her success with intensive lifestyle modifications for her multiple health conditions.   Objective:   Blood pressure 130/75, pulse 62, temperature 98.2 F (36.8 C), temperature source Oral, height 4\' 11"  (1.499 m), weight 174 lb (78.9 kg), SpO2 98 %. Body mass index is 35.14 kg/m.  General: Cooperative, alert, well developed, in no acute distress. HEENT: Conjunctivae and lids unremarkable. Cardiovascular: Regular rhythm.  Lungs: Normal work of breathing. Neurologic: No focal deficits.   Lab Results  Component Value Date   CREATININE 0.62 03/30/2020   BUN 11 03/30/2020   NA 137 03/30/2020   K 4.5 03/30/2020   CL 98 03/30/2020   CO2 26 03/30/2020   Lab Results  Component Value  Date   ALT 16 03/30/2020   AST 20 03/30/2020   ALKPHOS 86 03/30/2020   BILITOT 0.3 03/30/2020   Lab Results  Component Value Date   HGBA1C 5.2 03/30/2020   HGBA1C 5.1 06/08/2019   HGBA1C 5.1 12/24/2018   Lab Results  Component Value Date   INSULIN 8.4 03/30/2020   INSULIN 8.5 11/11/2019   INSULIN 8.0 06/08/2019   INSULIN 11.9 12/24/2018   Lab Results  Component Value Date   TSH 0.461 12/24/2018   Lab Results  Component Value Date   CHOL  200 (H) 03/30/2020   HDL 70 03/30/2020   LDLCALC 117 (H) 03/30/2020   TRIG 72 03/30/2020   Lab Results  Component Value Date   WBC 4.9 11/11/2019   HGB 15.2 11/11/2019   HCT 44.7 11/11/2019   MCV 86 11/11/2019   PLT 278 11/11/2019   No results found for: IRON, TIBC, FERRITIN  Obesity Behavioral Intervention Documentation for Insurance:   Approximately 15 minutes were spent on the discussion below.  ASK: We discussed the diagnosis of obesity with Jennifer Tyler today and Jennifer Tyler agreed to give [name] permission to discuss obesity behavioral modification therapy today.  ASSESS: Jennifer Tyler has the diagnosis of obesity and her BMI today is 35.13. Jennifer Tyler is in the action stage of change.   ADVISE: Jennifer Tyler was educated on the multiple health risks of obesity as well as the benefit of weight loss to improve her health. She was advised of the need for long term treatment and the importance of lifestyle modifications to improve her current health and to decrease her risk of future health problems.  AGREE: Multiple dietary modification options and treatment options were discussed and [name] agreed to follow the recommendations documented in the above note.  ARRANGE: Jennifer Tyler was educated on the importance of frequent visits to treat obesity as outlined per CMS and USPSTF guidelines and agreed to schedule her next follow up appointment today.  Attestation Statements:   Reviewed by clinician on day of visit: allergies, medications, problem list, medical history, surgical history, family history, social history, and previous encounter notes.   Wilhemena Durie, am acting as Location manager for [employer], FNP-C.  I have reviewed the above documentation for accuracy and completeness, and I agree with the above. -  Georgianne Fick, FNP

## 2020-06-21 ENCOUNTER — Other Ambulatory Visit (INDEPENDENT_AMBULATORY_CARE_PROVIDER_SITE_OTHER): Payer: Self-pay | Admitting: Family Medicine

## 2020-06-21 DIAGNOSIS — E559 Vitamin D deficiency, unspecified: Secondary | ICD-10-CM

## 2020-07-03 ENCOUNTER — Ambulatory Visit (INDEPENDENT_AMBULATORY_CARE_PROVIDER_SITE_OTHER): Payer: Medicare Other | Admitting: Family Medicine

## 2020-07-04 ENCOUNTER — Other Ambulatory Visit (INDEPENDENT_AMBULATORY_CARE_PROVIDER_SITE_OTHER): Payer: Self-pay | Admitting: Family Medicine

## 2020-07-04 ENCOUNTER — Encounter (INDEPENDENT_AMBULATORY_CARE_PROVIDER_SITE_OTHER): Payer: Self-pay

## 2020-07-04 DIAGNOSIS — F3289 Other specified depressive episodes: Secondary | ICD-10-CM

## 2020-07-04 NOTE — Telephone Encounter (Signed)
Message sent to pt-CS 

## 2020-07-18 ENCOUNTER — Ambulatory Visit (INDEPENDENT_AMBULATORY_CARE_PROVIDER_SITE_OTHER): Payer: Medicare Other | Admitting: Family Medicine

## 2020-07-18 ENCOUNTER — Encounter (INDEPENDENT_AMBULATORY_CARE_PROVIDER_SITE_OTHER): Payer: Self-pay | Admitting: Family Medicine

## 2020-07-18 ENCOUNTER — Other Ambulatory Visit: Payer: Self-pay

## 2020-07-18 VITALS — BP 133/80 | HR 63 | Temp 98.0°F | Ht 59.0 in | Wt 171.0 lb

## 2020-07-18 DIAGNOSIS — E669 Obesity, unspecified: Secondary | ICD-10-CM

## 2020-07-18 DIAGNOSIS — Z6834 Body mass index (BMI) 34.0-34.9, adult: Secondary | ICD-10-CM | POA: Diagnosis not present

## 2020-07-18 DIAGNOSIS — F3289 Other specified depressive episodes: Secondary | ICD-10-CM

## 2020-07-18 NOTE — Progress Notes (Signed)
Chief Complaint:   OBESITY Jennifer Tyler is here to discuss her progress with her obesity treatment plan along with follow-up of her obesity related diagnoses. Jennifer Tyler is on the [employer] and states she is following her eating plan approximately 50% of the time. Jennifer Tyler states she is doing 0 minutes 0 times per week.  Today's visit was #: 28 Starting weight: 193 lbs Starting date: 12/25/2019 Today's weight: 171 lbs Today's date: 07/18/2020 Total lbs lost to date: 22 Total lbs lost since last in-office visit: 3  Interim History: Jennifer Tyler has done very well on our plan losing 22 lbs since Feb 2021. She feels she should have lost more by now. Jennifer Tyler admits to sometimes eating out but she has overall done well on the plan. She does not always eat her yogurt at lunch.  Subjective:   1. Other depression, with emotional eating Jennifer Tyler notes her cravings are well controlled. She feels bupropion helps with cravings. Her blood pressure is well controlled.  Assessment/Plan:   1. Other depression, with emotional eating Behavior modification techniques were discussed today to help Jennifer Tyler deal with her emotional/non-hunger eating behaviors. Jennifer Tyler will continue bupropion. Orders and follow up as documented in patient record.   2. Class 1 obesity with serious comorbidity and body mass index (BMI) of 34.0 to 34.9 in adult, unspecified obesity type Jennifer Tyler is currently in the action stage of change. As such, her goal is to continue with weight loss efforts. She has agreed to the Category 2 Plan or the [place].   We discussed Category 2 because she will eat chicken, [place], and beef.  Exercise goals: Jennifer Tyler will restart exercising 3 times per week.  Behavioral modification strategies: increasing lean protein intake.  Jennifer Tyler has agreed to follow-up with our clinic in 3 weeks.   Objective:   Blood pressure 133/80, pulse 63, temperature 98 F (36.7 C), temperature source Oral, height 4\' 11"  (1.499 m), weight  171 lb (77.6 kg), SpO2 98 %. Body mass index is 34.54 kg/m.  General: Cooperative, alert, well developed, in no acute distress. HEENT: Conjunctivae and lids unremarkable. Cardiovascular: Regular rhythm.  Lungs: Normal work of breathing. Neurologic: No focal deficits.   Lab Results  Component Value Date   CREATININE 0.62 03/30/2020   BUN 11 03/30/2020   NA 137 03/30/2020   K 4.5 03/30/2020   CL 98 03/30/2020   CO2 26 03/30/2020   Lab Results  Component Value Date   ALT 16 03/30/2020   AST 20 03/30/2020   ALKPHOS 86 03/30/2020   BILITOT 0.3 03/30/2020   Lab Results  Component Value Date   HGBA1C 5.2 03/30/2020   HGBA1C 5.1 06/08/2019   HGBA1C 5.1 12/24/2018   Lab Results  Component Value Date   INSULIN 8.4 03/30/2020   INSULIN 8.5 11/11/2019   INSULIN 8.0 06/08/2019   INSULIN 11.9 12/24/2018   Lab Results  Component Value Date   TSH 0.461 12/24/2018   Lab Results  Component Value Date   CHOL 200 (H) 03/30/2020   HDL 70 03/30/2020   LDLCALC 117 (H) 03/30/2020   TRIG 72 03/30/2020   Lab Results  Component Value Date   WBC 4.9 11/11/2019   HGB 15.2 11/11/2019   HCT 44.7 11/11/2019   MCV 86 11/11/2019   PLT 278 11/11/2019   No results found for: IRON, TIBC, FERRITIN  Obesity Behavioral Intervention:   Approximately 15 minutes were spent on the discussion below.  ASK: We discussed the diagnosis of obesity  with Jennifer Tyler today and Jennifer Tyler agreed to give [name] permission to discuss obesity behavioral modification therapy today.  ASSESS: Jennifer Tyler has the diagnosis of obesity and her BMI today is 34.52. Jennifer Tyler is in the action stage of change.   ADVISE: Jennifer Tyler was educated on the multiple health risks of obesity as well as the benefit of weight loss to improve her health. She was advised of the need for long term treatment and the importance of lifestyle modifications to improve her current health and to decrease her risk of future health problems.  AGREE: Multiple dietary  modification options and treatment options were discussed and Jennifer Tyler agreed to follow the recommendations documented in the above note.  ARRANGE: Jennifer Tyler was educated on the importance of frequent visits to treat obesity as outlined per CMS and USPSTF guidelines and agreed to schedule her next follow up appointment today.  Attestation Statements:   Reviewed by clinician on day of visit: allergies, medications, problem list, medical history, surgical history, family history, social history, and previous encounter notes.   Wilhemena Durie, am acting as Location manager for [employer], FNP-C.  I have reviewed the above documentation for accuracy and completeness, and I agree with the above. -  Georgianne Fick, FNP

## 2020-07-19 ENCOUNTER — Encounter (INDEPENDENT_AMBULATORY_CARE_PROVIDER_SITE_OTHER): Payer: Self-pay | Admitting: Family Medicine

## 2020-07-29 ENCOUNTER — Other Ambulatory Visit (INDEPENDENT_AMBULATORY_CARE_PROVIDER_SITE_OTHER): Payer: Self-pay | Admitting: Family Medicine

## 2020-07-29 DIAGNOSIS — E559 Vitamin D deficiency, unspecified: Secondary | ICD-10-CM

## 2020-07-31 ENCOUNTER — Encounter (INDEPENDENT_AMBULATORY_CARE_PROVIDER_SITE_OTHER): Payer: Self-pay

## 2020-07-31 NOTE — Telephone Encounter (Signed)
Message sent to pt.

## 2020-08-08 ENCOUNTER — Other Ambulatory Visit (INDEPENDENT_AMBULATORY_CARE_PROVIDER_SITE_OTHER): Payer: Self-pay | Admitting: Family Medicine

## 2020-08-08 ENCOUNTER — Other Ambulatory Visit: Payer: Self-pay

## 2020-08-08 ENCOUNTER — Ambulatory Visit (INDEPENDENT_AMBULATORY_CARE_PROVIDER_SITE_OTHER): Payer: Medicare Other | Admitting: Family Medicine

## 2020-08-08 VITALS — BP 127/81 | HR 66 | Temp 98.2°F | Ht 59.0 in | Wt 170.0 lb

## 2020-08-08 DIAGNOSIS — E559 Vitamin D deficiency, unspecified: Secondary | ICD-10-CM

## 2020-08-08 DIAGNOSIS — Z6834 Body mass index (BMI) 34.0-34.9, adult: Secondary | ICD-10-CM | POA: Diagnosis not present

## 2020-08-08 DIAGNOSIS — I10 Essential (primary) hypertension: Secondary | ICD-10-CM | POA: Diagnosis not present

## 2020-08-08 DIAGNOSIS — E669 Obesity, unspecified: Secondary | ICD-10-CM

## 2020-08-08 MED ORDER — VITAMIN D (ERGOCALCIFEROL) 1.25 MG (50000 UNIT) PO CAPS: 50000.0000 [IU] | ORAL_CAPSULE | ORAL | 0 refills | Status: DC

## 2020-08-08 NOTE — Progress Notes (Signed)
Chief Complaint:   OBESITY Kristene is here to discuss her progress with her obesity treatment plan along with follow-up of her obesity related diagnoses. Hermina is on the Category 2 Plan or the [place] and states she is following her eating plan approximately 80% of the time. Aidee states she is doing cardio, strength, and aerobics for 30-45 minutes 4 times per week.  Today's visit was #: 31 Starting weight: 193 lbs Starting date: 12/25/2019 Today's weight: 170 lbs  Today's date: 08/08/2020 Total lbs lost to date: 23 Total lbs lost since last in-office visit: 1  Interim History: Artha is doing a great job with her exercise.  She has lost a total of 23 lbs since starting out program. She feels her hunger is satisfied. She has tried some new soups with protein and is enjoying these.  Subjective:   1. Vitamin D deficiency Honore's last Vit D level was low at 31.4. She is on prescription Vit D twice weekly.  2. Essential hypertension Gilbert's blood pressure is well controlled on hydrochlorothiazide 25 mg and lisinopril 10 mg. She has questions about how much sodium to have in a day. Cardiovascular ROS: no chest pain or dyspnea on exertion.  BP Readings from Last 3 Encounters:  08/08/20 127/81  07/18/20 133/80  06/19/20 130/75   Lab Results  Component Value Date   CREATININE 0.62 03/30/2020   CREATININE 0.55 (L) 11/11/2019   CREATININE 0.51 (L) 06/08/2019   Assessment/Plan:   1. Vitamin D deficiency We will refill prescription Vitamin D for 1 month. We will check labs at her next office visit.  - Vitamin D, Ergocalciferol, (DRISDOL) 1.25 MG (50000 UNIT) CAPS capsule; Take 1 capsule (50,000 Units total) by mouth every 3 (three) days.  Dispense: 10 capsule; Refill: 0  2. Essential hypertension Kayte will continue her medications. Donneisha was advised to limit her sodium to <2000 mg per day.  3. Class 1 obesity with serious comorbidity and body mass index (BMI) of 34.0 to 34.9 in  adult, unspecified obesity type Domonic is currently in the action stage of change. As such, her goal is to continue with weight loss efforts. She has agreed to the [employer].   We discussed keeping salt intake at <2000 mg per day.  Soups should be at least 12 grams of protein and <300 calories per can.  Exercise goals: As is.  Behavioral modification strategies: increasing lean protein intake, increasing water intake, decreasing sodium intake and meal planning and cooking strategies.  Christina has agreed to follow-up with our clinic in 3 weeks.   Objective:   Blood pressure 127/81, pulse 66, temperature 98.2 F (36.8 C), temperature source Oral, height 4\' 11"  (1.499 m), weight 170 lb (77.1 kg), SpO2 97 %. Body mass index is 34.34 kg/m.  General: Cooperative, alert, well developed, in no acute distress. HEENT: Conjunctivae and lids unremarkable. Cardiovascular: Regular rhythm.  Lungs: Normal work of breathing. Neurologic: No focal deficits.   Lab Results  Component Value Date   CREATININE 0.62 03/30/2020   BUN 11 03/30/2020   NA 137 03/30/2020   K 4.5 03/30/2020   CL 98 03/30/2020   CO2 26 03/30/2020   Lab Results  Component Value Date   ALT 16 03/30/2020   AST 20 03/30/2020   ALKPHOS 86 03/30/2020   BILITOT 0.3 03/30/2020   Lab Results  Component Value Date   HGBA1C 5.2 03/30/2020   HGBA1C 5.1 06/08/2019   HGBA1C 5.1 12/24/2018   Lab  Results  Component Value Date   INSULIN 8.4 03/30/2020   INSULIN 8.5 11/11/2019   INSULIN 8.0 06/08/2019   INSULIN 11.9 12/24/2018   Lab Results  Component Value Date   TSH 0.461 12/24/2018   Lab Results  Component Value Date   CHOL 200 (H) 03/30/2020   HDL 70 03/30/2020   LDLCALC 117 (H) 03/30/2020   TRIG 72 03/30/2020   Lab Results  Component Value Date   WBC 4.9 11/11/2019   HGB 15.2 11/11/2019   HCT 44.7 11/11/2019   MCV 86 11/11/2019   PLT 278 11/11/2019   No results found for: IRON, TIBC, FERRITIN  Obesity  Behavioral Intervention:   Approximately 15 minutes were spent on the discussion below.  ASK: We discussed the diagnosis of obesity with Yuval today and Legend agreed to give [name] permission to discuss obesity behavioral modification therapy today.  ASSESS: Tracia has the diagnosis of obesity and her BMI today is 34.32. Sindy is in the action stage of change.   ADVISE: Sunaina was educated on the multiple health risks of obesity as well as the benefit of weight loss to improve her health. She was advised of the need for long term treatment and the importance of lifestyle modifications to improve her current health and to decrease her risk of future health problems.  AGREE: Multiple dietary modification options and treatment options were discussed and Yaslin agreed to follow the recommendations documented in the above note.  ARRANGE: Toniann was educated on the importance of frequent visits to treat obesity as outlined per CMS and USPSTF guidelines and agreed to schedule her next follow up appointment today.  Attestation Statements:   Reviewed by clinician on day of visit: allergies, medications, problem list, medical history, surgical history, family history, social history, and previous encounter notes.   Wilhemena Durie, am acting as Location manager for [employer], FNP-C.  I have reviewed the above documentation for accuracy and completeness, and I agree with the above. -  Georgianne Fick, FNP

## 2020-08-09 ENCOUNTER — Encounter (INDEPENDENT_AMBULATORY_CARE_PROVIDER_SITE_OTHER): Payer: Self-pay | Admitting: Family Medicine

## 2020-08-29 ENCOUNTER — Ambulatory Visit (INDEPENDENT_AMBULATORY_CARE_PROVIDER_SITE_OTHER): Payer: Medicare Other | Admitting: Family Medicine

## 2020-08-29 ENCOUNTER — Other Ambulatory Visit: Payer: Self-pay

## 2020-08-29 ENCOUNTER — Encounter (INDEPENDENT_AMBULATORY_CARE_PROVIDER_SITE_OTHER): Payer: Self-pay | Admitting: Family Medicine

## 2020-08-29 VITALS — BP 137/81 | HR 70 | Temp 98.2°F | Ht 59.0 in | Wt 167.0 lb

## 2020-08-29 DIAGNOSIS — E8881 Metabolic syndrome: Secondary | ICD-10-CM

## 2020-08-29 DIAGNOSIS — E7849 Other hyperlipidemia: Secondary | ICD-10-CM | POA: Diagnosis not present

## 2020-08-29 DIAGNOSIS — F3289 Other specified depressive episodes: Secondary | ICD-10-CM

## 2020-08-29 DIAGNOSIS — E669 Obesity, unspecified: Secondary | ICD-10-CM

## 2020-08-29 DIAGNOSIS — E88819 Insulin resistance, unspecified: Secondary | ICD-10-CM

## 2020-08-29 DIAGNOSIS — E559 Vitamin D deficiency, unspecified: Secondary | ICD-10-CM

## 2020-08-29 MED ORDER — BUPROPION HCL ER (SR) 200 MG PO TB12: 200.0000 mg | ORAL_TABLET | Freq: Two times a day (BID) | ORAL | 0 refills | Status: DC

## 2020-08-30 LAB — COMPREHENSIVE METABOLIC PANEL
ALT: 16 IU/L (ref 0–32)
AST: 18 IU/L (ref 0–40)
Albumin/Globulin Ratio: 1.7 (ref 1.2–2.2)
Albumin: 4.4 g/dL (ref 3.8–4.8)
Alkaline Phosphatase: 78 IU/L (ref 44–121)
BUN/Creatinine Ratio: 19 (ref 12–28)
BUN: 13 mg/dL (ref 8–27)
Bilirubin Total: 0.4 mg/dL (ref 0.0–1.2)
CO2: 27 mmol/L (ref 20–29)
Calcium: 9.5 mg/dL (ref 8.7–10.3)
Chloride: 100 mmol/L (ref 96–106)
Creatinine, Ser: 0.67 mg/dL (ref 0.57–1.00)
GFR calc Af Amer: 104 mL/min/{1.73_m2} (ref >59)
GFR calc non Af Amer: 90 mL/min/{1.73_m2} (ref >59)
Globulin, Total: 2.6 g/dL (ref 1.5–4.5)
Glucose: 80 mg/dL (ref 65–99)
Potassium: 4.1 mmol/L (ref 3.5–5.2)
Sodium: 138 mmol/L (ref 134–144)
Total Protein: 7 g/dL (ref 6.0–8.5)

## 2020-08-30 LAB — LIPID PANEL WITH LDL/HDL RATIO
Cholesterol, Total: 218 mg/dL — ABNORMAL HIGH (ref 100–199)
HDL: 72 mg/dL (ref >39)
LDL Chol Calc (NIH): 133 mg/dL — ABNORMAL HIGH (ref 0–99)
LDL/HDL Ratio: 1.8 ratio (ref 0.0–3.2)
Triglycerides: 76 mg/dL (ref 0–149)
VLDL Cholesterol Cal: 13 mg/dL (ref 5–40)

## 2020-08-30 LAB — INSULIN, RANDOM: INSULIN: 8.8 u[IU]/mL (ref 2.6–24.9)

## 2020-08-30 LAB — HEMOGLOBIN A1C
Est. average glucose Bld gHb Est-mCnc: 100 mg/dL
Hgb A1c MFr Bld: 5.1 % (ref 4.8–5.6)

## 2020-08-30 LAB — VITAMIN D 25 HYDROXY (VIT D DEFICIENCY, FRACTURES): Vit D, 25-Hydroxy: 55.2 ng/mL (ref 30.0–100.0)

## 2020-08-30 NOTE — Progress Notes (Signed)
Chief Complaint:   OBESITY Jennifer Tyler is here to discuss her progress with her obesity treatment plan along with follow-up of her obesity related diagnoses. Jennifer Tyler is on the [employer] and states she is following her eating plan approximately 70% of the time. [name] states she is walking and doing resistance for 30-45 minutes 4 times per week.  Today's visit was #: 16 Starting weight: 193 lbs Starting date: 12/25/2019 Today's weight: 167 lbs Today's date: 08/29/2020 Total lbs lost to date: 26 Total lbs lost since last in-office visit: 3  Interim History: Jennifer Tyler has been doing intermittent fasting, and eating between 10 am and 6 pm. She feels it is helping her with weight loss. She is still eating all of the prescribed food. Her water intake is adequate. She is nearly at her goal of 165 lbs. She is doing weight training 2-3 times per week with aerobics.  Subjective:   1. Vitamin D deficiency Jennifer Tyler's Vit D level was low at 31.4. She is on prescription Vit D every 3 days.   2. Other depression, with emotional eating She feels bupropion is working well for her. She denies excessive cravings.  3. Other hyperlipidemia Louisa is not on statin. Last LDL was 117, and HDL was within normal limits at 70. Last triglycerides were normal at 70.  The 10-year ASCVD risk score Jennifer Tyler DC Jennifer Tyler., et al., 2013) is: 14.1%   Values used to calculate the score:     Age: 89 years     Sex: Female     Is Non-Hispanic African American: Yes     Diabetic: No     Tobacco smoker: No     Systolic Blood Pressure: 161 mmHg     Is BP treated: Yes     HDL Cholesterol: 72 mg/dL     Total Cholesterol: 218 mg/dL   Lab Results  Component Value Date   ALT 16 08/29/2020   AST 18 08/29/2020   ALKPHOS 78 08/29/2020   BILITOT 0.4 08/29/2020   Lab Results  Component Value Date   CHOL 218 (H) 08/29/2020   HDL 72 08/29/2020   LDLCALC 133 (H) 08/29/2020   TRIG 76 08/29/2020   4. Insulin resistance Trezure has a diagnosis  of insulin resistance based on her elevated fasting insulin level >5. She is not on metformin.She denies excessive hunger.  Lab Results  Component Value Date   INSULIN 8.8 08/29/2020   INSULIN 8.4 03/30/2020   INSULIN 8.5 11/11/2019   INSULIN 8.0 06/08/2019   INSULIN 11.9 12/24/2018   Lab Results  Component Value Date   HGBA1C 5.1 08/29/2020   Assessment/Plan:   1. Vitamin D deficiency  [name] agreed to continue taking prescription Vitamin D 50,000 IU every 3 days, and we will check labs today.  - VITAMIN D 25 Hydroxy (Vit-D Deficiency, Fractures)  2. Other depression, with emotional eating  We will refill bupropion for 1 month.   - buPROPion (WELLBUTRIN SR) 200 MG 12 hr tablet; Take 1 tablet (200 mg total) by mouth 2 (two) times daily.  Dispense: 60 tablet; Refill: 0  3. Other hyperlipidemia  Cresencia will continue to work on diet, exercise and weight loss efforts. We will check labs today.  - Lipid Panel With LDL/HDL Ratio  4. Insulin resistance  We will check labs today. - Hemoglobin A1c - Insulin, random - Comprehensive metabolic panel  5. Class 1 obesity with serious comorbidity in adult, unspecified BMI, unspecified obesity type Jennifer Tyler is currently in  the action stage of change. As such, her goal is to continue with weight loss efforts. She has agreed to the Category 2 Plan or the [place].   Exercise goals: As is.  Behavioral modification strategies: planning for success.  Jennifer Tyler has agreed to follow-up with our clinic in 3 weeks. Jennifer Tyler was informed we would discuss her lab results at her next visit unless there is a critical issue that needs to be addressed sooner. Jennifer Tyler agreed to keep her next visit at the agreed upon time to discuss these results.  Objective:   Blood pressure 137/81, pulse 70, temperature 98.2 F (36.8 C), height 4\' 11"  (1.499 m), weight 167 lb (75.8 kg), SpO2 96 %. Body mass index is 33.73 kg/m.  General: Cooperative, alert, well developed,  in no acute distress. HEENT: Conjunctivae and lids unremarkable. Cardiovascular: Regular rhythm.  Lungs: Normal work of breathing. Neurologic: No focal deficits.   Lab Results  Component Value Date   CREATININE 0.67 08/29/2020   BUN 13 08/29/2020   NA 138 08/29/2020   K 4.1 08/29/2020   CL 100 08/29/2020   CO2 27 08/29/2020   Lab Results  Component Value Date   ALT 16 08/29/2020   AST 18 08/29/2020   ALKPHOS 78 08/29/2020   BILITOT 0.4 08/29/2020   Lab Results  Component Value Date   HGBA1C 5.1 08/29/2020   HGBA1C 5.2 03/30/2020   HGBA1C 5.1 06/08/2019   HGBA1C 5.1 12/24/2018   Lab Results  Component Value Date   INSULIN 8.8 08/29/2020   INSULIN 8.4 03/30/2020   INSULIN 8.5 11/11/2019   INSULIN 8.0 06/08/2019   INSULIN 11.9 12/24/2018   Lab Results  Component Value Date   TSH 0.461 12/24/2018   Lab Results  Component Value Date   CHOL 218 (H) 08/29/2020   HDL 72 08/29/2020   LDLCALC 133 (H) 08/29/2020   TRIG 76 08/29/2020   Lab Results  Component Value Date   WBC 4.9 11/11/2019   HGB 15.2 11/11/2019   HCT 44.7 11/11/2019   MCV 86 11/11/2019   PLT 278 11/11/2019   No results found for: IRON, TIBC, FERRITIN  Obesity Behavioral Intervention:   Approximately 15 minutes were spent on the discussion below.  ASK: We discussed the diagnosis of obesity with Jennifer Tyler today and Jennifer Tyler agreed to give Jennifer Tyler permission to discuss obesity behavioral modification therapy today.  ASSESS: Jennifer Tyler has the diagnosis of obesity and her BMI today is 33.71. Jennifer Tyler is in the action stage of change.   ADVISE: Jennifer Tyler was educated on the multiple health risks of obesity as well as the benefit of weight loss to improve her health. She was advised of the need for long term treatment and the importance of lifestyle modifications to improve her current health and to decrease her risk of future health problems.  AGREE: Multiple dietary modification options and treatment options were discussed  and Jennifer Tyler agreed to follow the recommendations documented in the above note.  ARRANGE: Livianna was educated on the importance of frequent visits to treat obesity as outlined per CMS and USPSTF guidelines and agreed to schedule her next follow up appointment today.  Attestation Statements:   Reviewed by clinician on day of visit: allergies, medications, problem list, medical history, surgical history, family history, social history, and previous encounter notes.   Wilhemena Durie, am acting as Location manager for [employer], FNP-C.  I have reviewed the above documentation for accuracy and completeness, and I agree with the above. -  Georgianne Fick, FNP

## 2020-09-12 ENCOUNTER — Other Ambulatory Visit: Payer: Self-pay

## 2020-09-12 ENCOUNTER — Encounter (INDEPENDENT_AMBULATORY_CARE_PROVIDER_SITE_OTHER): Payer: Self-pay | Admitting: Family Medicine

## 2020-09-12 ENCOUNTER — Ambulatory Visit (INDEPENDENT_AMBULATORY_CARE_PROVIDER_SITE_OTHER): Payer: Medicare Other | Admitting: Family Medicine

## 2020-09-12 ENCOUNTER — Other Ambulatory Visit (INDEPENDENT_AMBULATORY_CARE_PROVIDER_SITE_OTHER): Payer: Self-pay | Admitting: Family Medicine

## 2020-09-12 VITALS — BP 132/82 | HR 71 | Temp 98.5°F | Ht 59.0 in | Wt 166.0 lb

## 2020-09-12 DIAGNOSIS — F3289 Other specified depressive episodes: Secondary | ICD-10-CM | POA: Diagnosis not present

## 2020-09-12 DIAGNOSIS — E559 Vitamin D deficiency, unspecified: Secondary | ICD-10-CM

## 2020-09-12 DIAGNOSIS — E7849 Other hyperlipidemia: Secondary | ICD-10-CM

## 2020-09-12 DIAGNOSIS — E669 Obesity, unspecified: Secondary | ICD-10-CM

## 2020-09-12 DIAGNOSIS — Z6833 Body mass index (BMI) 33.0-33.9, adult: Secondary | ICD-10-CM

## 2020-09-12 MED ORDER — VITAMIN D (ERGOCALCIFEROL) 1.25 MG (50000 UNIT) PO CAPS: 50000.0000 [IU] | ORAL_CAPSULE | ORAL | 1 refills | Status: DC

## 2020-09-12 MED ORDER — BUPROPION HCL ER (SR) 200 MG PO TB12: 200.0000 mg | ORAL_TABLET | Freq: Two times a day (BID) | ORAL | 1 refills | Status: DC

## 2020-09-12 NOTE — Progress Notes (Signed)
Chief Complaint:   OBESITY Jennifer Tyler is here to discuss her progress with her obesity treatment plan along with follow-up of her obesity related diagnoses. Jennifer Tyler is on the Category 2 Plan or the [place] and states she is following her eating plan approximately 80% of the time. Jennifer Tyler states she is walking for 20-30 minutes 5 times per week.  Today's visit was #: 77 Starting weight: 193 lbs Starting date: 12/25/2019 Today's weight: 166 lbs Today's date: 09/12/2020 Total lbs lost to date: 27 Total lbs lost since last in-office visit: 1  Interim History: Vincy notes some stress eating the week before she comes to see [name]. She is getting in all of the prescribed protein. Her hunger is satisfied and she is still eating all food between 10 am and 6 pm. She would like to take a short break from the program. She notes she always feels stressed and pressured when she is coming to weight in.   Subjective:   1. Other depression, with emotional eating Jennifer Tyler's mood is stable and her cravings are controlled. Her blood pressure is stable. She is tolerating bupropion well.  2. Vitamin D deficiency Jennifer Tyler's Vit D level is now at goal. She has been on prescription Vit D 2 times weekly.  3. Other hyperlipidemia Jennifer Tyler's LDL has increased to 133 from 117, and HDL and triglycerides are very good. She is not on statin. Her ASCVD risk score is 13.1 %. She feels she has increased her saturated fats.  I discussed labs with the patient today.  Lab Results  Component Value Date   ALT 16 08/29/2020   AST 18 08/29/2020   ALKPHOS 78 08/29/2020   BILITOT 0.4 08/29/2020   Lab Results  Component Value Date   CHOL 218 (H) 08/29/2020   HDL 72 08/29/2020   LDLCALC 133 (H) 08/29/2020   TRIG 76 08/29/2020   Assessment/Plan:   1. Other depression, with emotional eating  We will refill bupropion for 2 months. Orders and follow up as documented in patient record.   - buPROPion (WELLBUTRIN SR) 200 MG 12 hr  tablet; Take 1 tablet (200 mg total) by mouth 2 (two) times daily.  Dispense: 60 tablet; Refill: 1  2. Vitamin D deficiency  We will refill prescription Vitamin D for 2 months. Jennifer Tyler will follow-up for routine testing of Vitamin D, at least 2-3 times per year to avoid over-replacement.  - Vitamin D, Ergocalciferol, (DRISDOL) 1.25 MG (50000 UNIT) CAPS capsule; Take 1 capsule (50,000 Units total) by mouth every 7 (seven) days.  Dispense: 4 capsule; Refill: 1  3. Other hyperlipidemia . She will work on reducing saturated fats, and she declines statin today.   4. Class 1 obesity with serious comorbidity and body mass index (BMI) of 33.0 to 33.9 in adult, unspecified obesity type Jennifer Tyler is currently in the action stage of change. As such, her goal is to maintain weight for now. She has agreed to the Category 2 Plan or the [place].   Jennifer Tyler will weigh weekly and work on weight maintenance.  Exercise goals: As is.  Behavioral modification strategies: increasing lean protein intake, decreasing simple carbohydrates, meal planning and cooking strategies and holiday eating strategies .  Jennifer Tyler has agreed to follow-up with our clinic in 9 weeks per her request.   Objective:   Blood pressure 132/82, pulse 71, temperature 98.5 F (36.9 C), height 4\' 11"  (1.499 m), weight 166 lb (75.3 kg), SpO2 99 %. Body mass index is 33.53  kg/m.  General: Cooperative, alert, well developed, in no acute distress. HEENT: Conjunctivae and lids unremarkable. Cardiovascular: Regular rhythm.  Lungs: Normal work of breathing. Neurologic: No focal deficits.   Lab Results  Component Value Date   CREATININE 0.67 08/29/2020   BUN 13 08/29/2020   NA 138 08/29/2020   K 4.1 08/29/2020   CL 100 08/29/2020   CO2 27 08/29/2020   Lab Results  Component Value Date   ALT 16 08/29/2020   AST 18 08/29/2020   ALKPHOS 78 08/29/2020   BILITOT 0.4 08/29/2020   Lab Results  Component Value Date   HGBA1C 5.1 08/29/2020     HGBA1C 5.2 03/30/2020   HGBA1C 5.1 06/08/2019   HGBA1C 5.1 12/24/2018   Lab Results  Component Value Date   INSULIN 8.8 08/29/2020   INSULIN 8.4 03/30/2020   INSULIN 8.5 11/11/2019   INSULIN 8.0 06/08/2019   INSULIN 11.9 12/24/2018   Lab Results  Component Value Date   TSH 0.461 12/24/2018   Lab Results  Component Value Date   CHOL 218 (H) 08/29/2020   HDL 72 08/29/2020   LDLCALC 133 (H) 08/29/2020   TRIG 76 08/29/2020   Lab Results  Component Value Date   WBC 4.9 11/11/2019   HGB 15.2 11/11/2019   HCT 44.7 11/11/2019   MCV 86 11/11/2019   PLT 278 11/11/2019   No results found for: IRON, TIBC, FERRITIN  Obesity Behavioral Intervention:   Approximately 15 minutes were spent on the discussion below.  ASK: We discussed the diagnosis of obesity with Jennifer Tyler today and Jennifer Tyler agreed to give [name] permission to discuss obesity behavioral modification therapy today.  ASSESS: Jennifer Tyler has the diagnosis of obesity and her BMI today is 33.51. Jennifer Tyler is in the action stage of change.   ADVISE: Jennifer Tyler was educated on the multiple health risks of obesity as well as the benefit of weight loss to improve her health. She was advised of the need for long term treatment and the importance of lifestyle modifications to improve her current health and to decrease her risk of future health problems.  AGREE: Multiple dietary modification options and treatment options were discussed and Jennifer Tyler agreed to follow the recommendations documented in the above note.  ARRANGE: Jennifer Tyler was educated on the importance of frequent visits to treat obesity as outlined per CMS and USPSTF guidelines and agreed to schedule her next follow up appointment today.  Attestation Statements:   Reviewed by clinician on day of visit: allergies, medications, problem list, medical history, surgical history, family history, social history, and previous encounter notes.   Jennifer Tyler, am acting as Location manager for [employer], FNP-C.  I have reviewed the above documentation for accuracy and completeness, and I agree with the above. -  Jennifer Fick, FNP

## 2020-09-12 NOTE — Progress Notes (Signed)
The 10-year ASCVD risk score Mikey Bussing DC Brooke Bonito., et al., 2013) is: 13.1%   Values used to calculate the score:     Age: 69 years     Sex: Female     Is Non-Hispanic African American: Yes     Diabetic: No     Tobacco smoker: No     Systolic Blood Pressure: 847 mmHg     Is BP treated: Yes     HDL Cholesterol: 72 mg/dL     Total Cholesterol: 218 mg/dL

## 2020-09-13 ENCOUNTER — Encounter (INDEPENDENT_AMBULATORY_CARE_PROVIDER_SITE_OTHER): Payer: Self-pay | Admitting: Family Medicine

## 2020-11-02 ENCOUNTER — Other Ambulatory Visit (INDEPENDENT_AMBULATORY_CARE_PROVIDER_SITE_OTHER): Payer: Self-pay | Admitting: Family Medicine

## 2020-11-02 DIAGNOSIS — F3289 Other specified depressive episodes: Secondary | ICD-10-CM

## 2020-11-07 DIAGNOSIS — M810 Age-related osteoporosis without current pathological fracture: Secondary | ICD-10-CM | POA: Diagnosis not present

## 2020-11-07 DIAGNOSIS — I10 Essential (primary) hypertension: Secondary | ICD-10-CM | POA: Diagnosis not present

## 2020-11-07 DIAGNOSIS — E7800 Pure hypercholesterolemia, unspecified: Secondary | ICD-10-CM | POA: Diagnosis not present

## 2020-11-14 ENCOUNTER — Ambulatory Visit (INDEPENDENT_AMBULATORY_CARE_PROVIDER_SITE_OTHER): Payer: Medicare Other | Admitting: Family Medicine

## 2020-11-21 ENCOUNTER — Other Ambulatory Visit (INDEPENDENT_AMBULATORY_CARE_PROVIDER_SITE_OTHER): Payer: Self-pay | Admitting: Family Medicine

## 2020-11-21 ENCOUNTER — Encounter (INDEPENDENT_AMBULATORY_CARE_PROVIDER_SITE_OTHER): Payer: Self-pay | Admitting: Family Medicine

## 2020-11-21 ENCOUNTER — Other Ambulatory Visit: Payer: Self-pay

## 2020-11-21 ENCOUNTER — Encounter (INDEPENDENT_AMBULATORY_CARE_PROVIDER_SITE_OTHER): Payer: Self-pay

## 2020-11-21 ENCOUNTER — Telehealth (INDEPENDENT_AMBULATORY_CARE_PROVIDER_SITE_OTHER): Payer: Medicare Other | Admitting: Family Medicine

## 2020-11-21 ENCOUNTER — Telehealth (INDEPENDENT_AMBULATORY_CARE_PROVIDER_SITE_OTHER): Payer: Self-pay

## 2020-11-21 DIAGNOSIS — F3289 Other specified depressive episodes: Secondary | ICD-10-CM

## 2020-11-21 DIAGNOSIS — E669 Obesity, unspecified: Secondary | ICD-10-CM | POA: Diagnosis not present

## 2020-11-21 DIAGNOSIS — E559 Vitamin D deficiency, unspecified: Secondary | ICD-10-CM

## 2020-11-21 DIAGNOSIS — Z6833 Body mass index (BMI) 33.0-33.9, adult: Secondary | ICD-10-CM | POA: Diagnosis not present

## 2020-11-21 MED ORDER — BUPROPION HCL ER (SR) 200 MG PO TB12: 200.0000 mg | ORAL_TABLET | Freq: Two times a day (BID) | ORAL | 0 refills | Status: DC

## 2020-11-21 MED ORDER — VITAMIN D (ERGOCALCIFEROL) 1.25 MG (50000 UNIT) PO CAPS: 50000.0000 [IU] | ORAL_CAPSULE | ORAL | 0 refills | Status: DC

## 2020-11-21 NOTE — Telephone Encounter (Signed)
Verbal consent obtained to conduct video visit, via telehealth 

## 2020-11-21 NOTE — Telephone Encounter (Signed)
Pt was last seen by [facility] today.

## 2020-11-23 NOTE — Progress Notes (Signed)
TeleHealth Visit:  Due to the COVID-19 pandemic, this visit was completed with telemedicine (audio/video) technology to reduce patient and provider exposure as well as to preserve personal protective equipment.   Jennifer Tyler has verbally consented to this TeleHealth visit. The patient is located at home, the provider is located at the [facility] office. The participants in this visit include the listed provider and patient. The visit was conducted today via MyChart video.  Chief Complaint: OBESITY Jennifer Tyler is here to discuss her progress with her obesity treatment plan along with follow-up of her obesity related diagnoses. Jennifer Tyler is on the [employer] and states she is following her eating plan approximately 50% of the time. Jennifer Tyler states she is not exercising regularly at this time.  Today's visit was #: 92 Starting weight: 193 lbs Starting date: 12/25/2019  Interim History: Jennifer Tyler's last office visit was 09/12/2020, and her weight was 166 pounds.  She feels she has maintained her weight over the holidays.  She is 166 pounds at home today.  She is skipping lunch at times.  She stops eating for the day at 7 pm.  She has eggs or cereal at breakfast.  She has a sandwich at lunch if she eats lunch.  Goal weight is 160 pounds.  Subjective:   1. Vitamin D deficiency Vitamin D is at goal at 55.2.  She is on weekly prescription vitamin D.  2. Other depression, with emotional eating Mood stable.  Cravings well controlled on bupropion 200 mg daily.  Assessment/Plan:   1. Vitamin D deficiency Will refill vitamin D 50,000 IU weekly today, as per below.  -Refill Vitamin D, Ergocalciferol, (DRISDOL) 1.25 MG (50000 UNIT) CAPS capsule; Take 1 capsule (50,000 Units total) by mouth every 7 (seven) days.  Dispense: 4 capsule; Refill: 0  2. Other depression, with emotional eating Will refill bupropion 200 mg daily today, as per below.  -Refill buPROPion (WELLBUTRIN SR) 200 MG 12 hr tablet; Take  1 tablet (200 mg total) by mouth 2 (two) times daily.  Dispense: 60 tablet; Refill: 0  3. Class 1 obesity with serious comorbidity and body mass index (BMI) of 33.0 to 33.9 in adult, unspecified obesity type  Jennifer Tyler is currently in the action stage of change. As such, her goal is to continue with weight loss efforts. She has agreed to the Category 2 Plan and the [place].   Exercise goals: Older adults should follow the adult guidelines. When older adults cannot meet the adult guidelines, they should be as physically active as their abilities and conditions will allow.  Older adults should do exercises that maintain or improve balance if they are at risk of falling.   Behavioral modification strategies: increasing lean protein intake, no skipping meals and meal planning and cooking strategies.  [name] has agreed to follow-up with our clinic in 4 weeks.  Objective:   VITALS: Per patient if applicable, see vitals. GENERAL: Alert and in no acute distress. CARDIOPULMONARY: No increased WOB. Speaking in clear sentences.  PSYCH: Pleasant and cooperative. Speech normal rate and rhythm. Affect is appropriate. Insight and judgement are appropriate. Attention is focused, linear, and appropriate.  NEURO: Oriented as arrived to appointment on time with no prompting.   Lab Results  Component Value Date   CREATININE 0.67 08/29/2020   BUN 13 08/29/2020   NA 138 08/29/2020   K 4.1 08/29/2020   CL 100 08/29/2020   CO2 27 08/29/2020   Lab Results  Component Value Date  ALT 16 08/29/2020   AST 18 08/29/2020   ALKPHOS 78 08/29/2020   BILITOT 0.4 08/29/2020   Lab Results  Component Value Date   HGBA1C 5.1 08/29/2020   HGBA1C 5.2 03/30/2020   HGBA1C 5.1 06/08/2019   HGBA1C 5.1 12/24/2018   Lab Results  Component Value Date   INSULIN 8.8 08/29/2020   INSULIN 8.4 03/30/2020   INSULIN 8.5 11/11/2019   INSULIN 8.0 06/08/2019   INSULIN 11.9 12/24/2018   Lab Results  Component Value Date    TSH 0.461 12/24/2018   Lab Results  Component Value Date   CHOL 218 (H) 08/29/2020   HDL 72 08/29/2020   LDLCALC 133 (H) 08/29/2020   TRIG 76 08/29/2020   Lab Results  Component Value Date   WBC 4.9 11/11/2019   HGB 15.2 11/11/2019   HCT 44.7 11/11/2019   MCV 86 11/11/2019   PLT 278 11/11/2019   Attestation Statements:   Reviewed by clinician on day of visit: allergies, medications, problem list, medical history, surgical history, family history, social history, and previous encounter notes.  I, Water quality scientist, CMA, am acting as Location manager for [employer], [place].  I have reviewed the above documentation for accuracy and completeness, and I agree with the above. - Georgianne Fick, FNP

## 2020-11-25 ENCOUNTER — Encounter (INDEPENDENT_AMBULATORY_CARE_PROVIDER_SITE_OTHER): Payer: Self-pay | Admitting: Family Medicine

## 2020-12-18 ENCOUNTER — Ambulatory Visit (INDEPENDENT_AMBULATORY_CARE_PROVIDER_SITE_OTHER): Payer: Self-pay | Admitting: Family Medicine

## 2020-12-26 DIAGNOSIS — E7800 Pure hypercholesterolemia, unspecified: Secondary | ICD-10-CM | POA: Diagnosis not present

## 2020-12-26 DIAGNOSIS — M810 Age-related osteoporosis without current pathological fracture: Secondary | ICD-10-CM | POA: Diagnosis not present

## 2020-12-26 DIAGNOSIS — I10 Essential (primary) hypertension: Secondary | ICD-10-CM | POA: Diagnosis not present

## 2020-12-27 ENCOUNTER — Ambulatory Visit (INDEPENDENT_AMBULATORY_CARE_PROVIDER_SITE_OTHER): Payer: Medicare Other | Admitting: Family Medicine

## 2020-12-27 ENCOUNTER — Encounter (INDEPENDENT_AMBULATORY_CARE_PROVIDER_SITE_OTHER): Payer: Self-pay | Admitting: Family Medicine

## 2020-12-27 ENCOUNTER — Other Ambulatory Visit (INDEPENDENT_AMBULATORY_CARE_PROVIDER_SITE_OTHER): Payer: Self-pay | Admitting: Family Medicine

## 2020-12-27 ENCOUNTER — Other Ambulatory Visit: Payer: Self-pay

## 2020-12-27 VITALS — BP 124/77 | HR 68 | Temp 98.0°F | Ht 59.0 in | Wt 164.0 lb

## 2020-12-27 DIAGNOSIS — Z6833 Body mass index (BMI) 33.0-33.9, adult: Secondary | ICD-10-CM | POA: Diagnosis not present

## 2020-12-27 DIAGNOSIS — E559 Vitamin D deficiency, unspecified: Secondary | ICD-10-CM

## 2020-12-27 DIAGNOSIS — E669 Obesity, unspecified: Secondary | ICD-10-CM | POA: Diagnosis not present

## 2020-12-27 DIAGNOSIS — F3289 Other specified depressive episodes: Secondary | ICD-10-CM | POA: Diagnosis not present

## 2020-12-27 MED ORDER — BUPROPION HCL ER (SR) 200 MG PO TB12: 200.0000 mg | ORAL_TABLET | Freq: Two times a day (BID) | ORAL | 0 refills | Status: DC

## 2020-12-27 MED ORDER — VITAMIN D (ERGOCALCIFEROL) 1.25 MG (50000 UNIT) PO CAPS: 50000.0000 [IU] | ORAL_CAPSULE | ORAL | 0 refills | Status: DC

## 2020-12-28 ENCOUNTER — Other Ambulatory Visit: Payer: Self-pay | Admitting: Family Medicine

## 2020-12-28 ENCOUNTER — Encounter (INDEPENDENT_AMBULATORY_CARE_PROVIDER_SITE_OTHER): Payer: Self-pay | Admitting: Family Medicine

## 2020-12-28 DIAGNOSIS — Z1231 Encounter for screening mammogram for malignant neoplasm of breast: Secondary | ICD-10-CM

## 2020-12-28 NOTE — Progress Notes (Signed)
Chief Complaint:   OBESITY Jennifer Tyler is here to discuss her progress with her obesity treatment plan along with follow-up of her obesity related diagnoses. Jennifer Tyler is on the Category 2 Plan and the [place] and states she is following her eating plan approximately 50% of the time. Jennifer Tyler states she is walking and biking 20 minutes 3 times per week.  Today's visit was #: 26 Starting weight: 193 lbs Starting date: 12/25/2019 Today's weight: 164 lbs Today's date: 12/27/2020 Total lbs lost to date: 29 lbs Total lbs lost since last in-office visit: 2 lbs  Interim History: Jennifer Tyler has increased her activity. She is eating the prescribed protein. She drinks plenty of water. She has been using her air fryer. She is also adding cucumber or fruit to water.  Subjective:   1. Vitamin D deficiency Jennifer Tyler's Vitamin D level is at goal at 55.2 on 08/29/2020. She is currently taking prescription vitamin D 50,000 IU each week. She denies nausea, vomiting or muscle weakness.   Ref. Range 08/29/2020 11:54  Vitamin D, 25-Hydroxy Latest Ref Range: 30.0 - 100.0 ng/mL 55.2   2. Other depression, with emotional eating Jennifer Tyler's appetite and cravings are pretty well controlled. She is on Wellbutrin and her BP is within normal limits.  Assessment/Plan:   1. Vitamin D deficiency  She agrees to continue to take prescription Vitamin D @50 ,000 IU every week and will follow-up for routine testing of Vitamin D, at least 2-3 times per year to avoid over-replacement.  - Vitamin D, Ergocalciferol, (DRISDOL) 1.25 MG (50000 UNIT) CAPS capsule; Take 1 capsule (50,000 Units total) by mouth every 7 (seven) days.  Dispense: 4 capsule; Refill: 0  2. Other depression, with emotional eating Refill:  - buPROPion (WELLBUTRIN SR) 200 MG 12 hr tablet; Take 1 tablet (200 mg total) by mouth 2 (two) times daily.  Dispense: 60 tablet; Refill: 0  3. Class 1 obesity with serious comorbidity and body mass index (BMI) of 33.0 to 33.9 in  adult, unspecified obesity type Jennifer Tyler is currently in the action stage of change. As such, her goal is to continue with weight loss efforts. She has agreed to the Category 2 Plan and the [place].   Exercise goals: As is  Behavioral modification strategies: increasing lean protein intake.  Jennifer Tyler has agreed to follow-up with our clinic in 3 weeks.   Objective:   Blood pressure 124/77, pulse 68, temperature 98 F (36.7 C), height 4\' 11"  (1.499 m), weight 164 lb (74.4 kg), SpO2 99 %. Body mass index is 33.12 kg/m.  General: Cooperative, alert, well developed, in no acute distress. HEENT: Conjunctivae and lids unremarkable. Cardiovascular: Regular rhythm.  Lungs: Normal work of breathing. Neurologic: No focal deficits.   Lab Results  Component Value Date   CREATININE 0.67 08/29/2020   BUN 13 08/29/2020   NA 138 08/29/2020   K 4.1 08/29/2020   CL 100 08/29/2020   CO2 27 08/29/2020   Lab Results  Component Value Date   ALT 16 08/29/2020   AST 18 08/29/2020   ALKPHOS 78 08/29/2020   BILITOT 0.4 08/29/2020   Lab Results  Component Value Date   HGBA1C 5.1 08/29/2020   HGBA1C 5.2 03/30/2020   HGBA1C 5.1 06/08/2019   HGBA1C 5.1 12/24/2018   Lab Results  Component Value Date   INSULIN 8.8 08/29/2020   INSULIN 8.4 03/30/2020   INSULIN 8.5 11/11/2019   INSULIN 8.0 06/08/2019   INSULIN 11.9 12/24/2018   Lab Results  Component  Value Date   TSH 0.461 12/24/2018   Lab Results  Component Value Date   CHOL 218 (H) 08/29/2020   HDL 72 08/29/2020   LDLCALC 133 (H) 08/29/2020   TRIG 76 08/29/2020   Lab Results  Component Value Date   WBC 4.9 11/11/2019   HGB 15.2 11/11/2019   HCT 44.7 11/11/2019   MCV 86 11/11/2019   PLT 278 11/11/2019   No results found for: IRON, TIBC, FERRITIN  Obesity Behavioral Intervention:   Approximately 15 minutes were spent on the discussion below.  ASK: We discussed the diagnosis of obesity with Jennifer Tyler today and Jennifer Tyler agreed to  give [name] permission to discuss obesity behavioral modification therapy today.  ASSESS: Jennifer Tyler has the diagnosis of obesity and her BMI today is 33.2. Jennifer Tyler is in the action stage of change.   ADVISE: Jennifer Tyler was educated on the multiple health risks of obesity as well as the benefit of weight loss to improve her health. She was advised of the need for long term treatment and the importance of lifestyle modifications to improve her current health and to decrease her risk of future health problems.  AGREE: Multiple dietary modification options and treatment options were discussed and Jennifer Tyler agreed to follow the recommendations documented in the above note.  ARRANGE: Jennifer Tyler was educated on the importance of frequent visits to treat obesity as outlined per CMS and USPSTF guidelines and agreed to schedule her next follow up appointment today.  Attestation Statements:   Reviewed by clinician on day of visit: allergies, medications, problem list, medical history, surgical history, family history, social history, and previous encounter notes.  Jennifer Tyler, am acting as Location manager for [employer], [place].  I have reviewed the above documentation for accuracy and completeness, and I agree with the above. -  Jennifer Fick, FNP

## 2020-12-28 NOTE — Telephone Encounter (Signed)
Requesting 12 capsules

## 2021-01-17 ENCOUNTER — Ambulatory Visit (INDEPENDENT_AMBULATORY_CARE_PROVIDER_SITE_OTHER): Payer: Medicare Other | Admitting: Family Medicine

## 2021-01-23 ENCOUNTER — Ambulatory Visit (INDEPENDENT_AMBULATORY_CARE_PROVIDER_SITE_OTHER): Payer: Medicare Other | Admitting: Family Medicine

## 2021-01-23 ENCOUNTER — Other Ambulatory Visit: Payer: Self-pay

## 2021-01-23 ENCOUNTER — Encounter (INDEPENDENT_AMBULATORY_CARE_PROVIDER_SITE_OTHER): Payer: Self-pay | Admitting: Family Medicine

## 2021-01-23 VITALS — BP 133/75 | HR 72 | Temp 98.2°F | Ht 59.0 in | Wt 164.0 lb

## 2021-01-23 DIAGNOSIS — F3289 Other specified depressive episodes: Secondary | ICD-10-CM | POA: Diagnosis not present

## 2021-01-23 DIAGNOSIS — Z6838 Body mass index (BMI) 38.0-38.9, adult: Secondary | ICD-10-CM

## 2021-01-23 DIAGNOSIS — I10 Essential (primary) hypertension: Secondary | ICD-10-CM

## 2021-01-23 MED ORDER — BUPROPION HCL ER (SR) 200 MG PO TB12: 200.0000 mg | ORAL_TABLET | Freq: Two times a day (BID) | ORAL | 0 refills | Status: DC

## 2021-01-25 NOTE — Progress Notes (Signed)
Chief Complaint:   OBESITY Jennifer Tyler is here to discuss her progress with her obesity treatment plan along with follow-up of her obesity related diagnoses. Jennifer Tyler is on the Category 2 Plan or the [place] and states she is following her eating plan approximately 50% of the time. Jennifer Tyler states she is walking for 20 minutes 3 times per week.  Today's visit was #: 73 Starting weight: 193 lbs Starting date: 12/25/2019 Today's weight: 164 lbs Today's date: 01/23/2021 Total lbs lost to date: 29 Total lbs lost since last in-office visit: 0  Interim History: Jennifer Tyler is on the plan st breakfast and sometimes at lunch. She tends to deviate from the plan at dinner. She rarely eats out. She seems to be bored with the food. She says she wants to "eat what she wants and still lose weight" but she knows this is unreasonable..  Subjective:   1. Essential hypertension Jennifer Tyler's blood pressure is well controlled. She is on hydrochlorothiazide and lisinopril.   BP Readings from Last 3 Encounters:  01/23/21 133/75  12/27/20 124/77  09/12/20 132/82   Lab Results  Component Value Date   CREATININE 0.67 08/29/2020   CREATININE 0.62 03/30/2020   CREATININE 0.55 (L) 11/11/2019   2. Other depression, with emotional eating Jennifer Tyler's mood is stable on bupropion 200 mg BID. Her blood pressure is well controlled, and her cravings are well controlled.  Assessment/Plan:   1. Essential hypertension Jennifer Tyler will continue hydrochlorothiazide and lisinopril.  2. Other depression, with emotional eating We will refill bupropion for 1 month. Orders and follow up as documented in patient record.   - buPROPion (WELLBUTRIN SR) 200 MG 12 hr tablet; Take 1 tablet (200 mg total) by mouth 2 (two) times daily.  Dispense: 60 tablet; Refill: 0  3. Class 1 obesity: BMI 63 Jennifer Tyler is currently in the action stage of change. As such, her goal is to continue with weight loss efforts. She has agreed to the Category 2 Plan and keeping  a food journal and adhering to recommended goals of 400-500 calories and 35 grams of protein at supper daily.   Handouts were given today: Recipes, and Recipes II.  Exercise goals: As is. We discussed that she possibly going back to the gym.  Behavioral modification strategies: increasing lean protein intake and decreasing simple carbohydrates.  Jennifer Tyler has agreed to follow-up with our clinic in 3 weeks.  Objective:   Blood pressure 133/75, pulse 72, temperature 98.2 F (36.8 C), height 4\' 11"  (1.499 m), weight 164 lb (74.4 kg), SpO2 97 %. Body mass index is 33.12 kg/m.  General: Cooperative, alert, well developed, in no acute distress. HEENT: Conjunctivae and lids unremarkable. Cardiovascular: Regular rhythm.  Lungs: Normal work of breathing. Neurologic: No focal deficits.   Lab Results  Component Value Date   CREATININE 0.67 08/29/2020   BUN 13 08/29/2020   NA 138 08/29/2020   K 4.1 08/29/2020   CL 100 08/29/2020   CO2 27 08/29/2020   Lab Results  Component Value Date   ALT 16 08/29/2020   AST 18 08/29/2020   ALKPHOS 78 08/29/2020   BILITOT 0.4 08/29/2020   Lab Results  Component Value Date   HGBA1C 5.1 08/29/2020   HGBA1C 5.2 03/30/2020   HGBA1C 5.1 06/08/2019   HGBA1C 5.1 12/24/2018   Lab Results  Component Value Date   INSULIN 8.8 08/29/2020   INSULIN 8.4 03/30/2020   INSULIN 8.5 11/11/2019   INSULIN 8.0 06/08/2019   INSULIN 11.9 12/24/2018  Lab Results  Component Value Date   TSH 0.461 12/24/2018   Lab Results  Component Value Date   CHOL 218 (H) 08/29/2020   HDL 72 08/29/2020   LDLCALC 133 (H) 08/29/2020   TRIG 76 08/29/2020   Lab Results  Component Value Date   WBC 4.9 11/11/2019   HGB 15.2 11/11/2019   HCT 44.7 11/11/2019   MCV 86 11/11/2019   PLT 278 11/11/2019   No results found for: IRON, TIBC, FERRITIN  Obesity Behavioral Intervention:   Approximately 15 minutes were spent on the discussion below.  ASK: We discussed the  diagnosis of obesity with Jennifer Tyler today and Jennifer Tyler agreed to give [name] permission to discuss obesity behavioral modification therapy today.  ASSESS: Jennifer Tyler has the diagnosis of obesity and her BMI today is 33.11. Jennifer Tyler is in the action stage of change.   ADVISE: Jennifer Tyler was educated on the multiple health risks of obesity as well as the benefit of weight loss to improve her health. She was advised of the need for long term treatment and the importance of lifestyle modifications to improve her current health and to decrease her risk of future health problems.  AGREE: Multiple dietary modification options and treatment options were discussed and Jennifer Tyler agreed to follow the recommendations documented in the above note.  ARRANGE: Jennifer Tyler was educated on the importance of frequent visits to treat obesity as outlined per CMS and USPSTF guidelines and agreed to schedule her next follow up appointment today.  Attestation Statements:   Reviewed by clinician on day of visit: allergies, medications, problem list, medical history, surgical history, family history, social history, and previous encounter notes.   Wilhemena Durie, am acting as Location manager for [employer], FNP-C.  I have reviewed the above documentation for accuracy and completeness, and I agree with the above. -  Georgianne Fick, FNP

## 2021-01-26 ENCOUNTER — Encounter (INDEPENDENT_AMBULATORY_CARE_PROVIDER_SITE_OTHER): Payer: Self-pay | Admitting: Family Medicine

## 2021-02-01 DIAGNOSIS — M810 Age-related osteoporosis without current pathological fracture: Secondary | ICD-10-CM | POA: Diagnosis not present

## 2021-02-01 DIAGNOSIS — I10 Essential (primary) hypertension: Secondary | ICD-10-CM | POA: Diagnosis not present

## 2021-02-01 DIAGNOSIS — E7800 Pure hypercholesterolemia, unspecified: Secondary | ICD-10-CM | POA: Diagnosis not present

## 2021-02-13 ENCOUNTER — Ambulatory Visit (INDEPENDENT_AMBULATORY_CARE_PROVIDER_SITE_OTHER): Payer: Medicare Other | Admitting: Family Medicine

## 2021-02-16 ENCOUNTER — Inpatient Hospital Stay: Admission: RE | Admit: 2021-02-16 | Payer: Medicare Other | Source: Ambulatory Visit

## 2021-02-17 ENCOUNTER — Other Ambulatory Visit: Payer: Self-pay

## 2021-02-17 ENCOUNTER — Ambulatory Visit
Admission: RE | Admit: 2021-02-17 | Discharge: 2021-02-17 | Disposition: A | Discharge status: Home / Self Care | Payer: Medicare Other | Source: Ambulatory Visit | Attending: Family Medicine | Admitting: Family Medicine

## 2021-02-17 DIAGNOSIS — Z1231 Encounter for screening mammogram for malignant neoplasm of breast: Secondary | ICD-10-CM | POA: Diagnosis not present

## 2021-03-06 ENCOUNTER — Ambulatory Visit (INDEPENDENT_AMBULATORY_CARE_PROVIDER_SITE_OTHER): Payer: Medicare Other | Admitting: Family Medicine

## 2021-03-14 ENCOUNTER — Other Ambulatory Visit: Payer: Self-pay

## 2021-03-14 ENCOUNTER — Encounter (INDEPENDENT_AMBULATORY_CARE_PROVIDER_SITE_OTHER): Payer: Self-pay | Admitting: Adult Health

## 2021-03-14 ENCOUNTER — Ambulatory Visit (INDEPENDENT_AMBULATORY_CARE_PROVIDER_SITE_OTHER): Payer: Medicare Other | Admitting: Adult Health

## 2021-03-14 VITALS — BP 127/70 | HR 68 | Temp 98.8°F | Ht 59.0 in | Wt 166.0 lb

## 2021-03-14 DIAGNOSIS — F3289 Other specified depressive episodes: Secondary | ICD-10-CM

## 2021-03-14 DIAGNOSIS — Z6833 Body mass index (BMI) 33.0-33.9, adult: Secondary | ICD-10-CM | POA: Diagnosis not present

## 2021-03-14 DIAGNOSIS — E559 Vitamin D deficiency, unspecified: Secondary | ICD-10-CM | POA: Diagnosis not present

## 2021-03-14 DIAGNOSIS — E669 Obesity, unspecified: Secondary | ICD-10-CM

## 2021-03-14 MED ORDER — BUPROPION HCL ER (SR) 200 MG PO TB12: 200.0000 mg | ORAL_TABLET | Freq: Two times a day (BID) | ORAL | 0 refills | Status: DC

## 2021-03-14 NOTE — Progress Notes (Signed)
Chief Complaint:   OBESITY Jennifer Tyler is here to discuss her progress with her obesity treatment plan along with follow-up of her obesity related diagnoses. [name] is on the Category 2 Plan and the [place] and states she is following her eating plan approximately 50% of the time. [name] states she is walking 20-30 minutes 3 times per week.  Today's visit was #: 5 Starting weight: 193 lbs Starting date: 12/25/2019 Today's weight: 166 lbs Today's date: 03/14/2021 Total lbs lost to date: 27 lbs Total lbs lost since last in-office visit: 0  Interim History: [name] is following a hybrid of Category 2 and Pescatarian plan 50% of the time. The other 50% of the time she is consuming fried foods, i.e. steak, chicken. She is frustrated with weight loss plateau. She has been at mid 160's for months. She would like "to take a break" from the program. She initially wanted to lose down to 165 lbs. Now she wants to lose another 5-6 lbs- down to 160 lbs.  Subjective:   1. Vitamin D deficiency [name]'s Vitamin D level was 55.2 on 08/29/2020. She is currently taking prescription vitamin D 50,000 IU each week. She denies nausea, vomiting or muscle weakness.  2. Other depression, with emotional eating BP/HR are excellent at OV. She reports stable mood and well controlled cravings on bupropion SR 200 mg BID.  Assessment/Plan:   1. Vitamin D deficiency Low Vitamin D level contributes to fatigue and are associated with obesity, breast, and colon cancer. She agrees to continue to take prescription Vitamin D @50 ,000 IU every week and will follow-up for routine testing of Vitamin D, at least 2-3 times per year to avoid over-replacement. Check labs today.  - VITAMIN D 25 Hydroxy (Vit-D Deficiency, Fractures)  2. Other depression, with emotional eating Behavior modification techniques were discussed today to help [name] deal with her emotional/non-hunger eating behaviors.  Orders and follow up as documented in  patient record.   - buPROPion (WELLBUTRIN SR) 200 MG 12 hr tablet; Take 1 tablet (200 mg total) by mouth 2 (two) times daily.  Dispense: 60 tablet; Refill: 0  3. Class 1 obesity with serious comorbidity and body mass index (BMI) of 33.0 to 33.9 in adult, unspecified obesity type  [name] is currently in the action stage of change. As such, her goal is to continue with weight loss efforts. She has agreed to the Category 2 Plan and keeping a food journal and adhering to recommended goals of 400-500 calories and 35 g protein at supper.   Exercise goals: As is- increase resistance training.  Behavioral modification strategies: increasing lean protein intake, meal planning and cooking strategies and planning for success.  [name] has agreed to follow-up with our clinic in 8 weeks. She was informed of the importance of frequent follow-up visits to maximize her success with intensive lifestyle modifications for her multiple health conditions.   Objective:   Blood pressure 127/70, pulse 68, temperature 98.8 F (37.1 C), height 4\' 11"  (1.499 m), weight 166 lb (75.3 kg), SpO2 99 %. Body mass index is 33.53 kg/m.  General: Cooperative, alert, well developed, in no acute distress. HEENT: Conjunctivae and lids unremarkable. Cardiovascular: Regular rhythm.  Lungs: Normal work of breathing. Neurologic: No focal deficits.   Lab Results  Component Value Date   CREATININE 0.67 08/29/2020   BUN 13 08/29/2020   NA 138 08/29/2020   K 4.1 08/29/2020   CL 100 08/29/2020   CO2 27 08/29/2020   Lab Results  Component Value Date   ALT 16 08/29/2020   AST 18 08/29/2020   ALKPHOS 78 08/29/2020   BILITOT 0.4 08/29/2020   Lab Results  Component Value Date   HGBA1C 5.1 08/29/2020   HGBA1C 5.2 03/30/2020   HGBA1C 5.1 06/08/2019   HGBA1C 5.1 12/24/2018   Lab Results  Component Value Date   INSULIN 8.8 08/29/2020   INSULIN 8.4 03/30/2020   INSULIN 8.5 11/11/2019   INSULIN 8.0 06/08/2019   INSULIN 11.9  12/24/2018   Lab Results  Component Value Date   TSH 0.461 12/24/2018   Lab Results  Component Value Date   CHOL 218 (H) 08/29/2020   HDL 72 08/29/2020   LDLCALC 133 (H) 08/29/2020   TRIG 76 08/29/2020   Lab Results  Component Value Date   WBC 4.9 11/11/2019   HGB 15.2 11/11/2019   HCT 44.7 11/11/2019   MCV 86 11/11/2019   PLT 278 11/11/2019   No results found for: IRON, TIBC, FERRITIN  Obesity Behavioral Intervention:   Approximately 15 minutes were spent on the discussion below.  ASK: We discussed the diagnosis of obesity with [name] today and [name] agreed to give [name] permission to discuss obesity behavioral modification therapy today.  ASSESS: [name] has the diagnosis of obesity and her BMI today is 33.7. [name] is in the action stage of change.   ADVISE: [name] was educated on the multiple health risks of obesity as well as the benefit of weight loss to improve her health. She was advised of the need for long term treatment and the importance of lifestyle modifications to improve her current health and to decrease her risk of future health problems.  AGREE: Multiple dietary modification options and treatment options were discussed and [name] agreed to follow the recommendations documented in the above note.  ARRANGE: [name] was educated on the importance of frequent visits to treat obesity as outlined per CMS and USPSTF guidelines and agreed to schedule her next follow up appointment today.  Attestation Statements:   Reviewed by clinician on day of visit: allergies, medications, problem list, medical history, surgical history, family history, social history, and previous encounter notes.  Coral Ceo, CMA, am acting as transcriptionist for [name], NP.  I have reviewed the above documentation for accuracy and completeness, and I agree with the above. -  Mckinze Poirier d. Colan Laymon, NP-C

## 2021-03-15 ENCOUNTER — Other Ambulatory Visit (INDEPENDENT_AMBULATORY_CARE_PROVIDER_SITE_OTHER): Payer: Self-pay | Admitting: Adult Health

## 2021-03-15 ENCOUNTER — Encounter (INDEPENDENT_AMBULATORY_CARE_PROVIDER_SITE_OTHER): Payer: Self-pay | Admitting: Adult Health

## 2021-03-15 DIAGNOSIS — E559 Vitamin D deficiency, unspecified: Secondary | ICD-10-CM

## 2021-03-15 LAB — VITAMIN D 25 HYDROXY (VIT D DEFICIENCY, FRACTURES): Vit D, 25-Hydroxy: 42.4 ng/mL (ref 30.0–100.0)

## 2021-03-15 MED ORDER — VITAMIN D (ERGOCALCIFEROL) 1.25 MG (50000 UNIT) PO CAPS: 1.0000 | ORAL_CAPSULE | ORAL | 0 refills | Status: DC

## 2021-04-03 DIAGNOSIS — I10 Essential (primary) hypertension: Secondary | ICD-10-CM | POA: Diagnosis not present

## 2021-04-03 DIAGNOSIS — M810 Age-related osteoporosis without current pathological fracture: Secondary | ICD-10-CM | POA: Diagnosis not present

## 2021-04-03 DIAGNOSIS — E7800 Pure hypercholesterolemia, unspecified: Secondary | ICD-10-CM | POA: Diagnosis not present

## 2021-05-09 DIAGNOSIS — Z0000 Encounter for general adult medical examination without abnormal findings: Secondary | ICD-10-CM | POA: Diagnosis not present

## 2021-05-09 DIAGNOSIS — Z1389 Encounter for screening for other disorder: Secondary | ICD-10-CM | POA: Diagnosis not present

## 2021-05-10 DIAGNOSIS — E559 Vitamin D deficiency, unspecified: Secondary | ICD-10-CM | POA: Diagnosis not present

## 2021-05-10 DIAGNOSIS — M810 Age-related osteoporosis without current pathological fracture: Secondary | ICD-10-CM | POA: Diagnosis not present

## 2021-05-10 DIAGNOSIS — I10 Essential (primary) hypertension: Secondary | ICD-10-CM | POA: Diagnosis not present

## 2021-05-10 DIAGNOSIS — E7800 Pure hypercholesterolemia, unspecified: Secondary | ICD-10-CM | POA: Diagnosis not present

## 2021-05-10 DIAGNOSIS — Z0000 Encounter for general adult medical examination without abnormal findings: Secondary | ICD-10-CM | POA: Diagnosis not present

## 2021-05-14 ENCOUNTER — Other Ambulatory Visit: Payer: Self-pay | Admitting: Family Medicine

## 2021-05-14 DIAGNOSIS — Z1231 Encounter for screening mammogram for malignant neoplasm of breast: Secondary | ICD-10-CM

## 2021-05-14 DIAGNOSIS — M810 Age-related osteoporosis without current pathological fracture: Secondary | ICD-10-CM

## 2021-05-16 ENCOUNTER — Other Ambulatory Visit: Payer: Self-pay

## 2021-05-16 ENCOUNTER — Encounter (INDEPENDENT_AMBULATORY_CARE_PROVIDER_SITE_OTHER): Payer: Self-pay | Admitting: Adult Health

## 2021-05-16 ENCOUNTER — Ambulatory Visit (INDEPENDENT_AMBULATORY_CARE_PROVIDER_SITE_OTHER): Payer: Medicare Other | Admitting: Adult Health

## 2021-05-16 VITALS — BP 136/80 | HR 70 | Temp 98.3°F | Ht 59.0 in | Wt 168.0 lb

## 2021-05-16 DIAGNOSIS — E7849 Other hyperlipidemia: Secondary | ICD-10-CM

## 2021-05-16 DIAGNOSIS — E88819 Insulin resistance, unspecified: Secondary | ICD-10-CM

## 2021-05-16 DIAGNOSIS — E669 Obesity, unspecified: Secondary | ICD-10-CM | POA: Diagnosis not present

## 2021-05-16 DIAGNOSIS — F3289 Other specified depressive episodes: Secondary | ICD-10-CM

## 2021-05-16 DIAGNOSIS — E8881 Metabolic syndrome: Secondary | ICD-10-CM | POA: Diagnosis not present

## 2021-05-16 DIAGNOSIS — Z6833 Body mass index (BMI) 33.0-33.9, adult: Secondary | ICD-10-CM | POA: Diagnosis not present

## 2021-05-16 DIAGNOSIS — E559 Vitamin D deficiency, unspecified: Secondary | ICD-10-CM

## 2021-05-16 DIAGNOSIS — I10 Essential (primary) hypertension: Secondary | ICD-10-CM | POA: Diagnosis not present

## 2021-05-16 MED ORDER — BUPROPION HCL ER (SR) 200 MG PO TB12: 200.0000 mg | ORAL_TABLET | Freq: Two times a day (BID) | ORAL | 0 refills | Status: DC

## 2021-05-17 LAB — LIPID PANEL
Chol/HDL Ratio: 3.1 ratio (ref 0.0–4.4)
Cholesterol, Total: 207 mg/dL — ABNORMAL HIGH (ref 100–199)
HDL: 66 mg/dL (ref >39)
LDL Chol Calc (NIH): 126 mg/dL — ABNORMAL HIGH (ref 0–99)
Triglycerides: 83 mg/dL (ref 0–149)
VLDL Cholesterol Cal: 15 mg/dL (ref 5–40)

## 2021-05-17 LAB — COMPREHENSIVE METABOLIC PANEL
ALT: 15 IU/L (ref 0–32)
AST: 19 IU/L (ref 0–40)
Albumin/Globulin Ratio: 1.7 (ref 1.2–2.2)
Albumin: 4 g/dL (ref 3.8–4.8)
Alkaline Phosphatase: 80 IU/L (ref 44–121)
BUN/Creatinine Ratio: 13 (ref 12–28)
BUN: 8 mg/dL (ref 8–27)
Bilirubin Total: 0.4 mg/dL (ref 0.0–1.2)
CO2: 27 mmol/L (ref 20–29)
Calcium: 9.5 mg/dL (ref 8.7–10.3)
Chloride: 102 mmol/L (ref 96–106)
Creatinine, Ser: 0.61 mg/dL (ref 0.57–1.00)
Globulin, Total: 2.3 g/dL (ref 1.5–4.5)
Glucose: 85 mg/dL (ref 65–99)
Potassium: 4.1 mmol/L (ref 3.5–5.2)
Sodium: 141 mmol/L (ref 134–144)
Total Protein: 6.3 g/dL (ref 6.0–8.5)
eGFR: 97 mL/min/{1.73_m2} (ref >59)

## 2021-05-17 LAB — HEMOGLOBIN A1C
Est. average glucose Bld gHb Est-mCnc: 100 mg/dL
Hgb A1c MFr Bld: 5.1 % (ref 4.8–5.6)

## 2021-05-17 LAB — INSULIN, RANDOM: INSULIN: 6.7 u[IU]/mL (ref 2.6–24.9)

## 2021-05-22 NOTE — Progress Notes (Signed)
Chief Complaint:   OBESITY Jennifer Tyler is here to discuss her progress with her obesity treatment plan along with follow-up of her obesity related diagnoses. Jennifer Tyler is on the Category 2 Plan, keeping a food journal and adhering to recommended goals of 400-500 calories and 35 g protein with supper, and the [place] and states she is following her eating plan approximately 0% of the time. Jennifer Tyler states she is not currently exercising.  Today's visit was #: 50 Starting weight: 193 lbs Starting date: 12/25/2019  Today's weight: 168 lbs Today's date: 05/16/2021 Total lbs lost to date: 25 Total lbs lost since last in-office visit: 0  Interim History: Jennifer Tyler was recently married in [place]- she attended a long weekend of wedding events. Afterwards, she had grandchildren for a week.  She state, eating on plan "It went all out the door", the last several weeks. She will be home without additional family members for the next several weeks- can use this time to re-focus healthy eating habits.  Subjective:   1. Vitamin D deficiency 03/14/21 Vit D level was 42.4 - just below goal of 50. She is currently taking prescription vitamin D 50,000 IU each week. She denies nausea, vomiting or muscle weakness.   2. Other depression, with emotional eating BP/HR stable at OV. 12/24/18 EKG stable.  Jennifer Tyler reports stable mood. Pt denies suicidal or homicidal ideations. She is on bupropion SR 200 mg BID.  3. Essential hypertension BP/HR stable at OV.  Jennifer Tyler is on lisinopril 10 mg QD and HCTZ 25 mg QD.  BP Readings from Last 3 Encounters:  05/16/21 136/80  03/14/21 127/70  01/23/21 133/75   Lab Results  Component Value Date   CREATININE 0.61 05/16/2021   CREATININE 0.67 08/29/2020   CREATININE 0.62 03/30/2020   4. Insulin resistance Insulin level in the 8's at last several checks. Pt's BG and A1c are stable. She denies family history of type 2 diabetes.  Lab Results  Component Value Date    INSULIN 6.7 05/16/2021   INSULIN 8.8 08/29/2020   INSULIN 8.4 03/30/2020   INSULIN 8.5 11/11/2019   INSULIN 8.0 06/08/2019   Lab Results  Component Value Date   HGBA1C 5.1 05/16/2021   5. Other hyperlipidemia 08/29/2020 lipid panel total and LDL are slightly elevated. She has not been on statin therapy.  Lab Results  Component Value Date   ALT 15 05/16/2021   AST 19 05/16/2021   ALKPHOS 80 05/16/2021   BILITOT 0.4 05/16/2021   Lab Results  Component Value Date   CHOL 207 (H) 05/16/2021   HDL 66 05/16/2021   LDLCALC 126 (H) 05/16/2021   TRIG 83 05/16/2021   CHOLHDL 3.1 05/16/2021   Assessment/Plan:   1. Vitamin D deficiency Low Vitamin D level contributes to fatigue and are associated with obesity, breast, and colon cancer. She agrees to continue to take prescription Vitamin D @50 ,000 IU every week and will follow-up for routine testing of Vitamin D, at least 2-3 times per year to avoid over-replacement.  2. Other depression, with emotional eating Behavior modification techniques were discussed today to help Joan deal with her emotional/non-hunger eating behaviors.  Orders and follow up as documented in patient record.   Refill- buPROPion (WELLBUTRIN SR) 200 MG 12 hr tablet; Take 1 tablet (200 mg total) by mouth 2 (two) times daily.  Dispense: 60 tablet; Refill: 0  3. Essential hypertension Jennifer Tyler is working on healthy weight loss and exercise to improve blood pressure control. We  will watch for signs of hypotension as she continues her lifestyle modifications. Check labs today.  - Comprehensive metabolic panel  4. Insulin resistance Jennifer Tyler will continue to work on weight loss, exercise, and decreasing simple carbohydrates to help decrease the risk of diabetes. Jennifer Tyler agreed to follow-up with [name] as directed to closely monitor her progress. Check labs today.  - Hemoglobin A1c - Insulin, random  5. Other hyperlipidemia Cardiovascular risk and specific lipid/LDL goals  reviewed.  We discussed several lifestyle modifications today and Jennifer Tyler will continue to work on diet, exercise and weight loss efforts. Orders and follow up as documented in patient record.   Counseling Intensive lifestyle modifications are the first line treatment for this issue. Dietary changes: Increase soluble fiber. Decrease simple carbohydrates. Exercise changes: Moderate to vigorous-intensity aerobic activity 150 minutes per week if tolerated. Lipid-lowering medications: see documented in medical record. Check labs today.  - Lipid panel  6. Class 1 obesity with serious comorbidity and body mass index (BMI) of 33.0 to 33.9 in adult, unspecified obesity type  Jennifer Tyler is currently in the action stage of change. As such, her goal is to continue with weight loss efforts. She has agreed to the Category 2 Plan, keeping a food journal and adhering to recommended goals of 400-500 calories and 35 g protein with supper, and the Jennifer Tyler.   Exercise goals:  Resume daily walking and Jennifer Tyler  Behavioral modification strategies: increasing lean protein intake, decreasing simple carbohydrates, meal planning and cooking strategies, keeping healthy foods in the home, and planning for success.  Jennifer Tyler has agreed to follow-up with our clinic in 6 weeks. She was informed of the importance of frequent follow-up visits to maximize her success with intensive lifestyle modifications for her multiple health conditions.   Objective:   Blood pressure 136/80, pulse 70, temperature 98.3 F (36.8 C), height 4\' 11"  (1.499 m), weight 168 lb (76.2 kg), SpO2 97 %. Body mass index is 33.93 kg/m.  General: Cooperative, alert, well developed, in no acute distress. HEENT: Conjunctivae and lids unremarkable. Cardiovascular: Regular rhythm.  Lungs: Normal work of breathing. Neurologic: No focal deficits.   Lab Results  Component Value Date   CREATININE 0.61 05/16/2021   BUN 8 05/16/2021   NA 141  05/16/2021   K 4.1 05/16/2021   CL 102 05/16/2021   CO2 27 05/16/2021   Lab Results  Component Value Date   ALT 15 05/16/2021   AST 19 05/16/2021   ALKPHOS 80 05/16/2021   BILITOT 0.4 05/16/2021   Lab Results  Component Value Date   HGBA1C 5.1 05/16/2021   HGBA1C 5.1 08/29/2020   HGBA1C 5.2 03/30/2020   HGBA1C 5.1 06/08/2019   HGBA1C 5.1 12/24/2018   Lab Results  Component Value Date   INSULIN 6.7 05/16/2021   INSULIN 8.8 08/29/2020   INSULIN 8.4 03/30/2020   INSULIN 8.5 11/11/2019   INSULIN 8.0 06/08/2019   Lab Results  Component Value Date   TSH 0.461 12/24/2018   Lab Results  Component Value Date   CHOL 207 (H) 05/16/2021   HDL 66 05/16/2021   LDLCALC 126 (H) 05/16/2021   TRIG 83 05/16/2021   CHOLHDL 3.1 05/16/2021   Lab Results  Component Value Date   VD25OH 42.4 03/14/2021   VD25OH 55.2 08/29/2020   VD25OH 31.4 03/30/2020   Lab Results  Component Value Date   WBC 4.9 11/11/2019   HGB 15.2 11/11/2019   HCT 44.7 11/11/2019   MCV 86 11/11/2019   PLT 278  11/11/2019   No results found for: IRON, TIBC, FERRITIN  Obesity Behavioral Intervention:   Approximately 15 minutes were spent on the discussion below.  ASK: We discussed the diagnosis of obesity with Jennifer Tyler today and Jennifer Tyler agreed to give [name] permission to discuss obesity behavioral modification therapy today.  ASSESS: Jennifer Tyler has the diagnosis of obesity and her BMI today is 34.0. Jennifer Tyler is in the action stage of change.   ADVISE: Jennifer Tyler was educated on the multiple health risks of obesity as well as the benefit of weight loss to improve her health. She was advised of the need for long term treatment and the importance of lifestyle modifications to improve her current health and to decrease her risk of future health problems.  AGREE: Multiple dietary modification options and treatment options were discussed and Jennifer Tyler agreed to follow the recommendations documented in the above note.  ARRANGE: Jennifer Tyler was  educated on the importance of frequent visits to treat obesity as outlined per CMS and USPSTF guidelines and agreed to schedule her next follow up appointment today.  Attestation Statements:   Reviewed by clinician on day of visit: allergies, medications, problem list, medical history, surgical history, family history, social history, and previous encounter notes.  Coral Ceo, CMA, am acting as transcriptionist for [name], NP.  I have reviewed the above documentation for accuracy and completeness, and I agree with the above. -  Aahana Elza d. Devanta Daniel, NP-C

## 2021-05-24 DIAGNOSIS — E7800 Pure hypercholesterolemia, unspecified: Secondary | ICD-10-CM | POA: Diagnosis not present

## 2021-05-24 DIAGNOSIS — M810 Age-related osteoporosis without current pathological fracture: Secondary | ICD-10-CM | POA: Diagnosis not present

## 2021-05-24 DIAGNOSIS — I10 Essential (primary) hypertension: Secondary | ICD-10-CM | POA: Diagnosis not present

## 2021-06-19 ENCOUNTER — Other Ambulatory Visit (INDEPENDENT_AMBULATORY_CARE_PROVIDER_SITE_OTHER): Payer: Self-pay | Admitting: Adult Health

## 2021-06-19 DIAGNOSIS — E559 Vitamin D deficiency, unspecified: Secondary | ICD-10-CM

## 2021-06-20 NOTE — Telephone Encounter (Signed)
LAST APPOINTMENT DATE: 05/16/21 NEXT APPOINTMENT DATE: 06/27/21   [facility] [id number], [address] AT [address] [place] [address] Phone: [phone] Fax: [phone]  Patient is requesting a refill of the following medications: Pending Prescriptions:                       Disp   Refills   Vitamin D, Ergocalciferol, (DRISDOL) 1.25 *12 cap*0       Sig: TAKE 1 CAPSULE BY MOUTH EVERY 7 DAYS   Date last filled: 03/15/21 Previously prescribed by [facility]  Lab Results      Component                Value               Date                      HGBA1C                   5.1                 05/16/2021                HGBA1C                   5.1                 08/29/2020                HGBA1C                   5.2                 03/30/2020           Lab Results      Component                Value               Date                      LDLCALC                  126 (H)             05/16/2021                CREATININE               0.61                05/16/2021           Lab Results      Component                Value               Date                      VD25OH                   42.4                03/14/2021                VD25OH  55.2                08/29/2020                VD25OH                   31.4                03/30/2020            BP Readings from Last 3 Encounters: 05/16/21 : 136/80 03/14/21 : 127/70 01/23/21 : 133/75

## 2021-06-27 ENCOUNTER — Ambulatory Visit (INDEPENDENT_AMBULATORY_CARE_PROVIDER_SITE_OTHER): Payer: Medicare Other | Admitting: Adult Health

## 2021-07-04 ENCOUNTER — Ambulatory Visit (INDEPENDENT_AMBULATORY_CARE_PROVIDER_SITE_OTHER): Payer: Medicare Other | Admitting: Adult Health

## 2021-07-04 ENCOUNTER — Other Ambulatory Visit: Payer: Self-pay

## 2021-07-04 VITALS — BP 143/83 | HR 68 | Temp 98.6°F | Ht 59.0 in | Wt 171.0 lb

## 2021-07-04 DIAGNOSIS — I10 Essential (primary) hypertension: Secondary | ICD-10-CM | POA: Diagnosis not present

## 2021-07-04 DIAGNOSIS — Z6834 Body mass index (BMI) 34.0-34.9, adult: Secondary | ICD-10-CM | POA: Diagnosis not present

## 2021-07-04 DIAGNOSIS — F3289 Other specified depressive episodes: Secondary | ICD-10-CM | POA: Diagnosis not present

## 2021-07-04 DIAGNOSIS — E669 Obesity, unspecified: Secondary | ICD-10-CM

## 2021-07-04 MED ORDER — BUPROPION HCL ER (SR) 200 MG PO TB12: 200.0000 mg | ORAL_TABLET | Freq: Two times a day (BID) | ORAL | 0 refills | Status: DC

## 2021-07-04 NOTE — Progress Notes (Signed)
Chief Complaint:   OBESITY Jennifer Tyler is here to discuss her progress with her obesity treatment plan along with follow-up of her obesity related diagnoses. Jennifer Tyler is on the Category 2 Plan, keeping a food journal and adhering to recommended goals of 400-500 calories and 35 grams of protein, and the Jennifer Tyler with supper and states she is following her eating plan approximately 30% of the time. Jennifer Tyler states she is not exercising regularly.  Today's visit was #: 37 Starting weight: 193 lbs Starting date: 12/25/2019 Today's weight: 171 lbs Today's date: 07/04/2021 Total lbs lost to date: 22 lbs Total lbs lost since last in-office visit: 0  Interim History: Jennifer Tyler says that over the last few weeks she has, "just been off track".  She has been consuming potatoes, fried pork chops, fried chicken, cookies, fried fish.   She is rotating between Category 2 and the Pescatarian meal plan.  Subjective:   1. Essential hypertension BP elevated at office visit.  She reports being frustrated with recent weight gain. She denies CP/dyspnea/palpitations.  BP Readings from Last 3 Encounters:  07/04/21 (!) 143/83  05/16/21 136/80  03/14/21 127/70   2. Other depression, with emotional eating BP slightly elevated at office visit. She really feels that bupropion SR 200 mg BID has helped curb her cravings.  Assessment/Plan:   1. Essential hypertension Continue lisinopril 10 mg daily, HCTZ 25 mg daily.  2. Other depression, with emotional eating Refill bupropion 200 mg BID, as per below.  - Refill buPROPion (WELLBUTRIN SR) 200 MG 12 hr tablet; Take 1 tablet (200 mg total) by mouth 2 (two) times daily.  Dispense: 60 tablet; Refill: 0  3. Class 1 obesity with serious comorbidity and body mass index (BMI) of 34.0 to 34.9 in adult, unspecified obesity type  Jennifer Tyler is currently in the action stage of change. As such, her goal is to continue with weight loss efforts. She has agreed to the Category 2  Plan, keeping a food journal and adhering to recommended goals of 400-500 calories and grams of protein at supper, and the Jennifer Tyler.   Exercise goals:  As is.  Walk 5 minutes everyday.  Behavioral modification strategies: increasing lean protein intake, decreasing simple carbohydrates, keeping healthy foods in the home, ways to avoid boredom eating, better snacking choices, planning for success, and decreasing junk food.  Try to use an air fryer instead of oil frying.  Maximum 2 meals per week of fried proteins.  Walk for 5 minutes everyday.  Recommend more frequent Follow-up - she declines and prefers 8 week follow-up.  Jennifer Tyler has agreed to follow-up with our clinic in 8 weeks. She was informed of the importance of frequent follow-up visits to maximize her success with intensive lifestyle modifications for her multiple health conditions.   Objective:   Blood pressure (!) 143/83, pulse 68, temperature 98.6 F (37 C), height '4\' 11"'$  (1.499 m), weight 171 lb (77.6 kg), SpO2 97 %. Body mass index is 34.54 kg/m.  General: Cooperative, alert, well developed, in no acute distress. HEENT: Conjunctivae and lids unremarkable. Cardiovascular: Regular rhythm.  Lungs: Normal work of breathing. Neurologic: No focal deficits.   Lab Results  Component Value Date   CREATININE 0.61 05/16/2021   BUN 8 05/16/2021   NA 141 05/16/2021   K 4.1 05/16/2021   CL 102 05/16/2021   CO2 27 05/16/2021   Lab Results  Component Value Date   ALT 15 05/16/2021   AST 19 05/16/2021   ALKPHOS  80 05/16/2021   BILITOT 0.4 05/16/2021   Lab Results  Component Value Date   HGBA1C 5.1 05/16/2021   HGBA1C 5.1 08/29/2020   HGBA1C 5.2 03/30/2020   HGBA1C 5.1 06/08/2019   HGBA1C 5.1 12/24/2018   Lab Results  Component Value Date   INSULIN 6.7 05/16/2021   INSULIN 8.8 08/29/2020   INSULIN 8.4 03/30/2020   INSULIN 8.5 11/11/2019   INSULIN 8.0 06/08/2019   Lab Results  Component Value Date   TSH  0.461 12/24/2018   Lab Results  Component Value Date   CHOL 207 (H) 05/16/2021   HDL 66 05/16/2021   LDLCALC 126 (H) 05/16/2021   TRIG 83 05/16/2021   CHOLHDL 3.1 05/16/2021   Lab Results  Component Value Date   VD25OH 42.4 03/14/2021   VD25OH 55.2 08/29/2020   VD25OH 31.4 03/30/2020   Lab Results  Component Value Date   WBC 4.9 11/11/2019   HGB 15.2 11/11/2019   HCT 44.7 11/11/2019   MCV 86 11/11/2019   PLT 278 11/11/2019   Obesity Behavioral Intervention:   Approximately 15 minutes were spent on the discussion below.  ASK: We discussed the diagnosis of obesity with Jennifer Tyler today and Jennifer Tyler agreed to give [name] permission to discuss obesity behavioral modification therapy today.  ASSESS: Jennifer Tyler has the diagnosis of obesity and her BMI today is 34.6. Jennifer Tyler is in the action stage of change.   ADVISE: Jennifer Tyler was educated on the multiple health risks of obesity as well as the benefit of weight loss to improve her health. She was advised of the need for long term treatment and the importance of lifestyle modifications to improve her current health and to decrease her risk of future health problems.  AGREE: Multiple dietary modification options and treatment options were discussed and Jennifer Tyler agreed to follow the recommendations documented in the above note.  ARRANGE: Jennifer Tyler was educated on the importance of frequent visits to treat obesity as outlined per CMS and USPSTF guidelines and agreed to schedule her next follow up appointment today.  Attestation Statements:   Reviewed by clinician on day of visit: allergies, medications, problem list, medical history, surgical history, family history, social history, and previous encounter notes.  I, Water quality scientist, CMA, am acting as Location manager for [name], NP.  I have reviewed the above documentation for accuracy and completeness, and I agree with the above. -  Shonette Rhames d. Charly Hunton, NP-C

## 2021-07-12 DIAGNOSIS — K219 Gastro-esophageal reflux disease without esophagitis: Secondary | ICD-10-CM | POA: Diagnosis not present

## 2021-07-20 DIAGNOSIS — K219 Gastro-esophageal reflux disease without esophagitis: Secondary | ICD-10-CM | POA: Diagnosis not present

## 2021-07-20 DIAGNOSIS — M810 Age-related osteoporosis without current pathological fracture: Secondary | ICD-10-CM | POA: Diagnosis not present

## 2021-07-20 DIAGNOSIS — E7800 Pure hypercholesterolemia, unspecified: Secondary | ICD-10-CM | POA: Diagnosis not present

## 2021-07-20 DIAGNOSIS — I10 Essential (primary) hypertension: Secondary | ICD-10-CM | POA: Diagnosis not present

## 2021-08-15 DIAGNOSIS — I10 Essential (primary) hypertension: Secondary | ICD-10-CM | POA: Diagnosis not present

## 2021-08-15 DIAGNOSIS — K219 Gastro-esophageal reflux disease without esophagitis: Secondary | ICD-10-CM | POA: Diagnosis not present

## 2021-08-15 DIAGNOSIS — M810 Age-related osteoporosis without current pathological fracture: Secondary | ICD-10-CM | POA: Diagnosis not present

## 2021-08-15 DIAGNOSIS — E7800 Pure hypercholesterolemia, unspecified: Secondary | ICD-10-CM | POA: Diagnosis not present

## 2021-08-22 ENCOUNTER — Ambulatory Visit (INDEPENDENT_AMBULATORY_CARE_PROVIDER_SITE_OTHER): Payer: Medicare Other | Admitting: Adult Health

## 2021-08-22 DIAGNOSIS — K219 Gastro-esophageal reflux disease without esophagitis: Secondary | ICD-10-CM | POA: Diagnosis not present

## 2021-08-22 DIAGNOSIS — R058 Other specified cough: Secondary | ICD-10-CM | POA: Diagnosis not present

## 2021-09-17 DIAGNOSIS — H04123 Dry eye syndrome of bilateral lacrimal glands: Secondary | ICD-10-CM | POA: Diagnosis not present

## 2021-09-17 DIAGNOSIS — H40023 Open angle with borderline findings, high risk, bilateral: Secondary | ICD-10-CM | POA: Diagnosis not present

## 2021-09-17 DIAGNOSIS — Z961 Presence of intraocular lens: Secondary | ICD-10-CM | POA: Diagnosis not present

## 2021-09-17 DIAGNOSIS — H1045 Other chronic allergic conjunctivitis: Secondary | ICD-10-CM | POA: Diagnosis not present

## 2021-09-20 ENCOUNTER — Encounter (INDEPENDENT_AMBULATORY_CARE_PROVIDER_SITE_OTHER): Payer: Self-pay | Admitting: Adult Health

## 2021-09-20 ENCOUNTER — Other Ambulatory Visit: Payer: Self-pay

## 2021-09-20 ENCOUNTER — Ambulatory Visit (INDEPENDENT_AMBULATORY_CARE_PROVIDER_SITE_OTHER): Payer: Medicare Other | Admitting: Adult Health

## 2021-09-20 VITALS — BP 150/79 | HR 70 | Temp 98.2°F | Ht 59.0 in | Wt 176.0 lb

## 2021-09-20 DIAGNOSIS — E559 Vitamin D deficiency, unspecified: Secondary | ICD-10-CM | POA: Diagnosis not present

## 2021-09-20 DIAGNOSIS — I10 Essential (primary) hypertension: Secondary | ICD-10-CM

## 2021-09-20 DIAGNOSIS — Z6838 Body mass index (BMI) 38.0-38.9, adult: Secondary | ICD-10-CM

## 2021-09-20 DIAGNOSIS — E669 Obesity, unspecified: Secondary | ICD-10-CM

## 2021-09-20 DIAGNOSIS — Z6833 Body mass index (BMI) 33.0-33.9, adult: Secondary | ICD-10-CM

## 2021-09-20 DIAGNOSIS — F3289 Other specified depressive episodes: Secondary | ICD-10-CM | POA: Diagnosis not present

## 2021-09-20 MED ORDER — BUPROPION HCL ER (SR) 200 MG PO TB12: 200.0000 mg | ORAL_TABLET | Freq: Two times a day (BID) | ORAL | 1 refills | Status: AC

## 2021-09-20 NOTE — Progress Notes (Signed)
Chief Complaint:   OBESITY Makelle is here to discuss her progress with her obesity treatment plan along with follow-up of her obesity related diagnoses. Jamita is on the Category 2 Plan and keeping a food journal and adhering to recommended goals of 400-500 calories and PC at supper and states she is following her eating plan approximately 0% of the time. Fred states she is not exercising regularly.  Today's visit was #: 37 Starting weight: 193 lbs Starting date: 12/25/2019 Today's weight: 176 lbs Today's date: 09/20/2021 Total lbs lost to date: 17 lbs Total lbs lost since last in-office visit: 0  Interim History:  Emiliana says that since September, her sister Smith Robert) suddenly passed away in [place] (55 - Immunologist).   Coron's daughter Clarene Critchley) needs help to settle Corona's estate.   Ms. Fortune will travel back to [place] after Thanksgiving to assist her family with cleaning out Corona's home and selling the property. She estimates that she will be up [place] for months. She will not be able to make regular f/u OVs here. She would like to pause the program to deal with her family situation.  Subjective:   1. Essential hypertension She has not taken lisinopril 10 mg QD, HCTZ 25 mg prior to OV. She was rushing to make her appt. She denies acute cardiac symptoms.  She denies tobacco/vape use.  2. Vitamin D deficiency She is currently taking prescription ergocalciferol 50,000 IU each week. She denies nausea, vomiting or muscle weakness.  3. Other depression, with emotional eating She is on bupropion SR 200 mg BID - reports stable mood. She denies SI/HI.  BP elevated today - she did not take her antihypertensives prior to office visit.  Previous 3 BP readings at goal. She denies hx of seizure activity.  Assessment/Plan:   1. Essential hypertension Take antihypertensives as directed.  2. Vitamin D deficiency Check vitamin D level. Check labs and refill ergocalciferol after  labs.  - VITAMIN D 25 Hydroxy (Vit-D Deficiency, Fractures)  3. Other depression, with emotional eating Check labs - CMP. Refill bupropion SR 200 mg BID, 3 months supply with 1 refill.  - Comprehensive metabolic panel - Refill buPROPion (WELLBUTRIN SR) 200 MG 12 hr tablet; Take 1 tablet (200 mg total) by mouth 2 (two) times daily.  Dispense: 180 tablet; Refill: 1  4. Class 1 obesity: BMI 35.6  Sybella is currently in the action stage of change. As such, her goal is to continue with weight loss efforts. She has agreed to the Category 2 Plan and keeping a food journal and adhering to recommended goals of 400-500 calories and 35 grams of protein at supper. Get in 20-35 grams of protein at each meal.  Exercise goals: No exercise has been prescribed at this time.  Behavioral modification strategies: increasing lean protein intake, decreasing simple carbohydrates, meal planning and cooking strategies, keeping healthy foods in the home, and planning for success.  Adylin has agreed to follow-up with our clinic in 5 months. She was informed of the importance of frequent follow-up visits to maximize her success with intensive lifestyle modifications for her multiple health conditions.   Objective:   Blood pressure (!) 150/79, pulse 70, temperature 98.2 F (36.8 C), height 4\' 11"  (1.499 m), weight 176 lb (79.8 kg), SpO2 98 %. Body mass index is 35.55 kg/m.  General: Cooperative, alert, well developed, in no acute distress. HEENT: Conjunctivae and lids unremarkable. Cardiovascular: Regular rhythm.  Lungs: Normal work of breathing. Neurologic: No focal  deficits.   Lab Results  Component Value Date   CREATININE 0.61 05/16/2021   BUN 8 05/16/2021   NA 141 05/16/2021   K 4.1 05/16/2021   CL 102 05/16/2021   CO2 27 05/16/2021   Lab Results  Component Value Date   ALT 15 05/16/2021   AST 19 05/16/2021   ALKPHOS 80 05/16/2021   BILITOT 0.4 05/16/2021   Lab Results  Component Value Date    HGBA1C 5.1 05/16/2021   HGBA1C 5.1 08/29/2020   HGBA1C 5.2 03/30/2020   HGBA1C 5.1 06/08/2019   HGBA1C 5.1 12/24/2018   Lab Results  Component Value Date   INSULIN 6.7 05/16/2021   INSULIN 8.8 08/29/2020   INSULIN 8.4 03/30/2020   INSULIN 8.5 11/11/2019   INSULIN 8.0 06/08/2019   Lab Results  Component Value Date   TSH 0.461 12/24/2018   Lab Results  Component Value Date   CHOL 207 (H) 05/16/2021   HDL 66 05/16/2021   LDLCALC 126 (H) 05/16/2021   TRIG 83 05/16/2021   CHOLHDL 3.1 05/16/2021   Lab Results  Component Value Date   VD25OH 42.4 03/14/2021   VD25OH 55.2 08/29/2020   VD25OH 31.4 03/30/2020   Lab Results  Component Value Date   WBC 4.9 11/11/2019   HGB 15.2 11/11/2019   HCT 44.7 11/11/2019   MCV 86 11/11/2019   PLT 278 11/11/2019   Obesity Behavioral Intervention:   Approximately 15 minutes were spent on the discussion below.  ASK: We discussed the diagnosis of obesity with Aileana today and Analilia agreed to give [name] permission to discuss obesity behavioral modification therapy today.  ASSESS: Zhavia has the diagnosis of obesity and her BMI today is 35.6. Sophiamarie is in the action stage of change.   ADVISE: Dona was educated on the multiple health risks of obesity as well as the benefit of weight loss to improve her health. She was advised of the need for long term treatment and the importance of lifestyle modifications to improve her current health and to decrease her risk of future health problems.  AGREE: Multiple dietary modification options and treatment options were discussed and Kewanna agreed to follow the recommendations documented in the above note.  ARRANGE: Soriah was educated on the importance of frequent visits to treat obesity as outlined per CMS and USPSTF guidelines and agreed to schedule her next follow up appointment today.  Attestation Statements:   Reviewed by clinician on day of visit: allergies, medications, problem list, medical history,  surgical history, family history, social history, and previous encounter notes.  I, Water quality scientist, CMA, am acting as Location manager for [name], NP.  I have reviewed the above documentation for accuracy and completeness, and I agree with the above. -  Dquan Cortopassi d. Darchelle Nunes, NP-C

## 2021-09-21 LAB — COMPREHENSIVE METABOLIC PANEL
ALT: 17 IU/L (ref 0–32)
AST: 19 IU/L (ref 0–40)
Albumin/Globulin Ratio: 1.8 (ref 1.2–2.2)
Albumin: 4.2 g/dL (ref 3.8–4.8)
Alkaline Phosphatase: 86 IU/L (ref 44–121)
BUN/Creatinine Ratio: 19 (ref 12–28)
BUN: 11 mg/dL (ref 8–27)
Bilirubin Total: 0.4 mg/dL (ref 0.0–1.2)
CO2: 28 mmol/L (ref 20–29)
Calcium: 9.4 mg/dL (ref 8.7–10.3)
Chloride: 94 mmol/L — ABNORMAL LOW (ref 96–106)
Creatinine, Ser: 0.57 mg/dL (ref 0.57–1.00)
Globulin, Total: 2.4 g/dL (ref 1.5–4.5)
Glucose: 86 mg/dL (ref 70–99)
Potassium: 4.2 mmol/L (ref 3.5–5.2)
Sodium: 134 mmol/L (ref 134–144)
Total Protein: 6.6 g/dL (ref 6.0–8.5)
eGFR: 98 mL/min/{1.73_m2} (ref >59)

## 2021-09-21 LAB — VITAMIN D 25 HYDROXY (VIT D DEFICIENCY, FRACTURES): Vit D, 25-Hydroxy: 44.9 ng/mL (ref 30.0–100.0)

## 2021-09-24 DIAGNOSIS — R066 Hiccough: Secondary | ICD-10-CM | POA: Diagnosis not present

## 2021-09-24 DIAGNOSIS — K219 Gastro-esophageal reflux disease without esophagitis: Secondary | ICD-10-CM | POA: Diagnosis not present

## 2021-09-25 ENCOUNTER — Other Ambulatory Visit (INDEPENDENT_AMBULATORY_CARE_PROVIDER_SITE_OTHER): Payer: Self-pay | Admitting: Family Medicine

## 2021-09-25 DIAGNOSIS — E559 Vitamin D deficiency, unspecified: Secondary | ICD-10-CM

## 2021-09-25 NOTE — Telephone Encounter (Signed)
LAST APPOINTMENT DATE: 09-20-21 NEXT APPOINTMENT DATE: 16-Mar-2021 PT OOT death in family   [facility] [address] [place] [address] Phone: [phone] Fax: [phone]  Patient is requesting a refill of the following medications: Requested Prescriptions   Pending Prescriptions Disp Refills   Vitamin D, Ergocalciferol, (DRISDOL) 1.25 MG (50000 UNIT) CAPS capsule [Pharmacy Med Name: VITAMIN D2 50,000IU (ERGO) CAP RX] 12 capsule 0    Sig: TAKE 1 CAPSULE BY MOUTH EVERY 7 DAYS    Date last filled: 06/20/21 Previously prescribed by [facility]  Lab Results  Component Value Date   HGBA1C 5.1 05/16/2021   HGBA1C 5.1 08/29/2020   HGBA1C 5.2 03/30/2020   Lab Results  Component Value Date   LDLCALC 126 (H) 05/16/2021   CREATININE 0.57 09/20/2021   Lab Results  Component Value Date   VD25OH 44.9 09/20/2021   VD25OH 42.4 03/14/2021   VD25OH 55.2 08/29/2020    BP Readings from Last 3 Encounters:  09/20/21 (!) 150/79  07/04/21 (!) 143/83  05/16/21 136/80

## 2021-10-22 ENCOUNTER — Other Ambulatory Visit: Payer: Self-pay | Admitting: Physician Assistant

## 2021-10-22 DIAGNOSIS — R1310 Dysphagia, unspecified: Secondary | ICD-10-CM

## 2021-10-22 DIAGNOSIS — K219 Gastro-esophageal reflux disease without esophagitis: Secondary | ICD-10-CM | POA: Diagnosis not present

## 2021-10-22 DIAGNOSIS — Z1211 Encounter for screening for malignant neoplasm of colon: Secondary | ICD-10-CM | POA: Diagnosis not present

## 2021-10-23 ENCOUNTER — Ambulatory Visit
Admission: RE | Admit: 2021-10-23 | Discharge: 2021-10-23 | Disposition: A | Discharge status: Home / Self Care | Payer: Medicare Other | Source: Ambulatory Visit | Attending: Physician Assistant | Admitting: Physician Assistant

## 2021-10-23 DIAGNOSIS — R1310 Dysphagia, unspecified: Secondary | ICD-10-CM | POA: Diagnosis not present

## 2021-10-23 DIAGNOSIS — K224 Dyskinesia of esophagus: Secondary | ICD-10-CM | POA: Diagnosis not present

## 2021-11-09 ENCOUNTER — Ambulatory Visit
Admission: RE | Admit: 2021-11-09 | Discharge: 2021-11-09 | Disposition: A | Discharge status: Home / Self Care | Payer: Medicare Other | Source: Ambulatory Visit | Attending: Family Medicine | Admitting: Family Medicine

## 2021-11-09 ENCOUNTER — Other Ambulatory Visit: Payer: Self-pay

## 2021-11-09 DIAGNOSIS — M810 Age-related osteoporosis without current pathological fracture: Secondary | ICD-10-CM | POA: Diagnosis not present

## 2021-11-09 DIAGNOSIS — M8589 Other specified disorders of bone density and structure, multiple sites: Secondary | ICD-10-CM | POA: Diagnosis not present

## 2021-11-09 DIAGNOSIS — Z780 Asymptomatic menopausal state: Secondary | ICD-10-CM | POA: Diagnosis not present

## 2021-11-20 ENCOUNTER — Other Ambulatory Visit (INDEPENDENT_AMBULATORY_CARE_PROVIDER_SITE_OTHER): Payer: Self-pay | Admitting: Family Medicine

## 2021-11-20 DIAGNOSIS — E559 Vitamin D deficiency, unspecified: Secondary | ICD-10-CM

## 2021-12-14 DIAGNOSIS — M810 Age-related osteoporosis without current pathological fracture: Secondary | ICD-10-CM | POA: Diagnosis not present

## 2021-12-21 DIAGNOSIS — R1310 Dysphagia, unspecified: Secondary | ICD-10-CM | POA: Diagnosis not present

## 2021-12-21 DIAGNOSIS — K449 Diaphragmatic hernia without obstruction or gangrene: Secondary | ICD-10-CM | POA: Diagnosis not present

## 2021-12-21 DIAGNOSIS — Z1211 Encounter for screening for malignant neoplasm of colon: Secondary | ICD-10-CM | POA: Diagnosis not present

## 2021-12-21 DIAGNOSIS — K2930 Chronic superficial gastritis without bleeding: Secondary | ICD-10-CM | POA: Diagnosis not present

## 2021-12-21 DIAGNOSIS — K3189 Other diseases of stomach and duodenum: Secondary | ICD-10-CM | POA: Diagnosis not present

## 2021-12-21 DIAGNOSIS — B9681 Helicobacter pylori [H. pylori] as the cause of diseases classified elsewhere: Secondary | ICD-10-CM | POA: Diagnosis not present

## 2021-12-21 DIAGNOSIS — Q399 Congenital malformation of esophagus, unspecified: Secondary | ICD-10-CM | POA: Diagnosis not present

## 2021-12-21 DIAGNOSIS — K5730 Diverticulosis of large intestine without perforation or abscess without bleeding: Secondary | ICD-10-CM | POA: Diagnosis not present

## 2021-12-21 DIAGNOSIS — K635 Polyp of colon: Secondary | ICD-10-CM | POA: Diagnosis not present

## 2021-12-21 DIAGNOSIS — K317 Polyp of stomach and duodenum: Secondary | ICD-10-CM | POA: Diagnosis not present

## 2021-12-25 ENCOUNTER — Other Ambulatory Visit (INDEPENDENT_AMBULATORY_CARE_PROVIDER_SITE_OTHER): Payer: Self-pay | Admitting: Adult Health

## 2021-12-25 DIAGNOSIS — E559 Vitamin D deficiency, unspecified: Secondary | ICD-10-CM

## 2021-12-25 MED ORDER — VITAMIN D (ERGOCALCIFEROL) 1.25 MG (50000 UNIT) PO CAPS: 50000.0000 [IU] | ORAL_CAPSULE | ORAL | 0 refills | Status: DC

## 2021-12-25 NOTE — Telephone Encounter (Signed)
LAST APPOINTMENT DATE: 09/20/21 NEXT APPOINTMENT DATE: 02/18/22   [id number] [address], [address] AT [address] [place] [address] Phone: [phone] Fax: [phone]  Patient is requesting a refill of the following medications: Pending Prescriptions:                       Disp   Refills   Vitamin D, Ergocalciferol, (DRISDOL) 1.25 *12 cap*0       Sig: Take 1 capsule (50,000 Units total) by mouth every 7          (seven) days.   Date last filled: 06/20/22 Previously prescribed by [facility]  Lab Results      Component                Value               Date                      HGBA1C                   5.1                 05/16/2021                HGBA1C                   5.1                 08/29/2020                HGBA1C                   5.2                 03/30/2020           Lab Results      Component                Value               Date                      LDLCALC                  126 (H)             05/16/2021                CREATININE               0.57                09/20/2021           Lab Results      Component                Value               Date                      VD25OH                   44.9                09/20/2021  VD25OH                   42.4                03/14/2021                VD25OH                   55.2                08/29/2020            BP Readings from Last 3 Encounters: 09/20/21 : (!) 150/79 07/04/21 : (!) 143/83 05/16/21 : 136/80

## 2021-12-26 DIAGNOSIS — K635 Polyp of colon: Secondary | ICD-10-CM | POA: Diagnosis not present

## 2021-12-26 DIAGNOSIS — B9681 Helicobacter pylori [H. pylori] as the cause of diseases classified elsewhere: Secondary | ICD-10-CM | POA: Diagnosis not present

## 2021-12-26 DIAGNOSIS — K2930 Chronic superficial gastritis without bleeding: Secondary | ICD-10-CM | POA: Diagnosis not present

## 2021-12-26 DIAGNOSIS — K317 Polyp of stomach and duodenum: Secondary | ICD-10-CM | POA: Diagnosis not present

## 2022-02-18 ENCOUNTER — Ambulatory Visit (INDEPENDENT_AMBULATORY_CARE_PROVIDER_SITE_OTHER): Payer: Medicare Other | Admitting: Adult Health

## 2022-02-19 ENCOUNTER — Other Ambulatory Visit: Payer: Self-pay | Admitting: Family Medicine

## 2022-02-19 DIAGNOSIS — Z1231 Encounter for screening mammogram for malignant neoplasm of breast: Secondary | ICD-10-CM

## 2022-02-25 ENCOUNTER — Ambulatory Visit (INDEPENDENT_AMBULATORY_CARE_PROVIDER_SITE_OTHER): Payer: Medicare Other | Admitting: Adult Health

## 2022-03-01 ENCOUNTER — Ambulatory Visit
Admission: RE | Admit: 2022-03-01 | Discharge: 2022-03-01 | Disposition: A | Discharge status: Home / Self Care | Payer: Medicare Other | Source: Ambulatory Visit | Attending: Family Medicine | Admitting: Family Medicine

## 2022-03-01 DIAGNOSIS — Z1231 Encounter for screening mammogram for malignant neoplasm of breast: Secondary | ICD-10-CM | POA: Diagnosis not present

## 2022-03-06 DIAGNOSIS — B9681 Helicobacter pylori [H. pylori] as the cause of diseases classified elsewhere: Secondary | ICD-10-CM | POA: Diagnosis not present

## 2022-03-19 ENCOUNTER — Ambulatory Visit (INDEPENDENT_AMBULATORY_CARE_PROVIDER_SITE_OTHER): Payer: Medicare Other | Admitting: Adult Health

## 2022-05-08 IMAGING — MG MM DIGITAL SCREENING BILAT W/ TOMO AND CAD
6 of 10 series · 6 of 30 positions shown · non-contrast
Comparison: Previous exam(s).

CLINICAL DATA: Screening.

EXAM:
DIGITAL SCREENING BILATERAL MAMMOGRAM WITH TOMOSYNTHESIS AND CAD
TECHNIQUE: Bilateral screening digital craniocaudal and mediolateral oblique
mammograms were obtained. Bilateral screening digital breast
tomosynthesis was performed. The images were evaluated with
computer-aided detection.

[R MLO synth-2D]
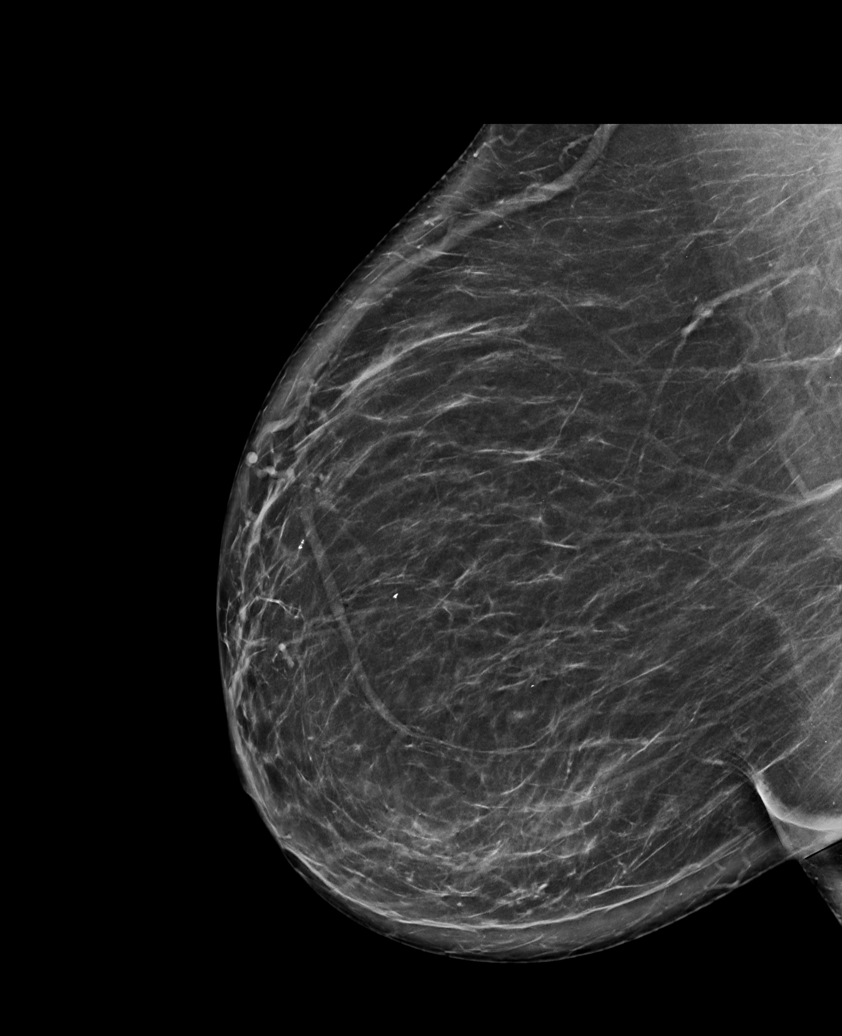

[L MLO synth-2D]
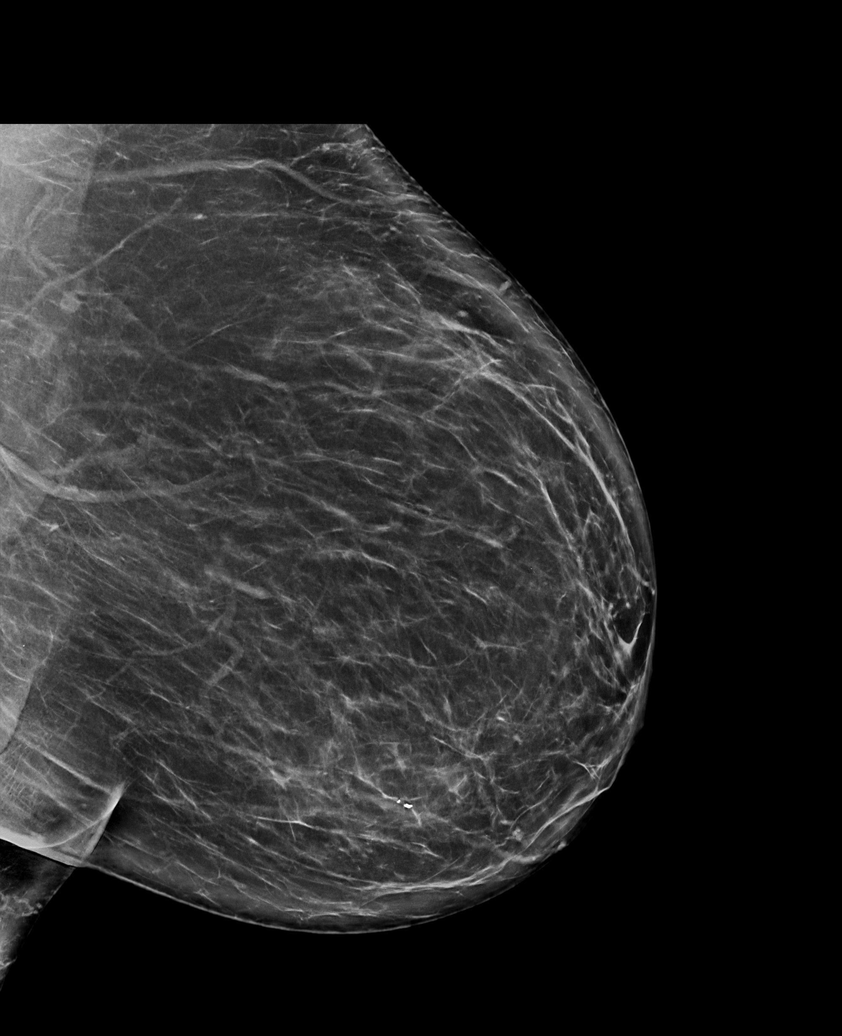

[R CC synth-2D]
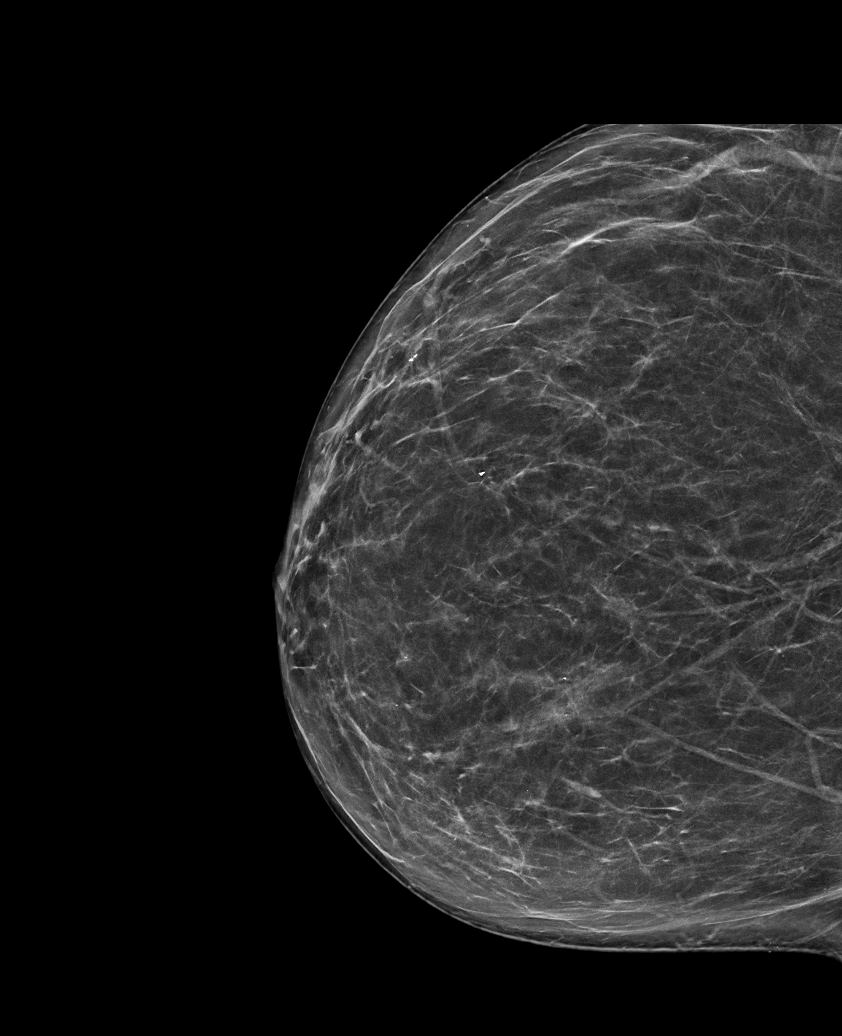

[L CV synth-2D]
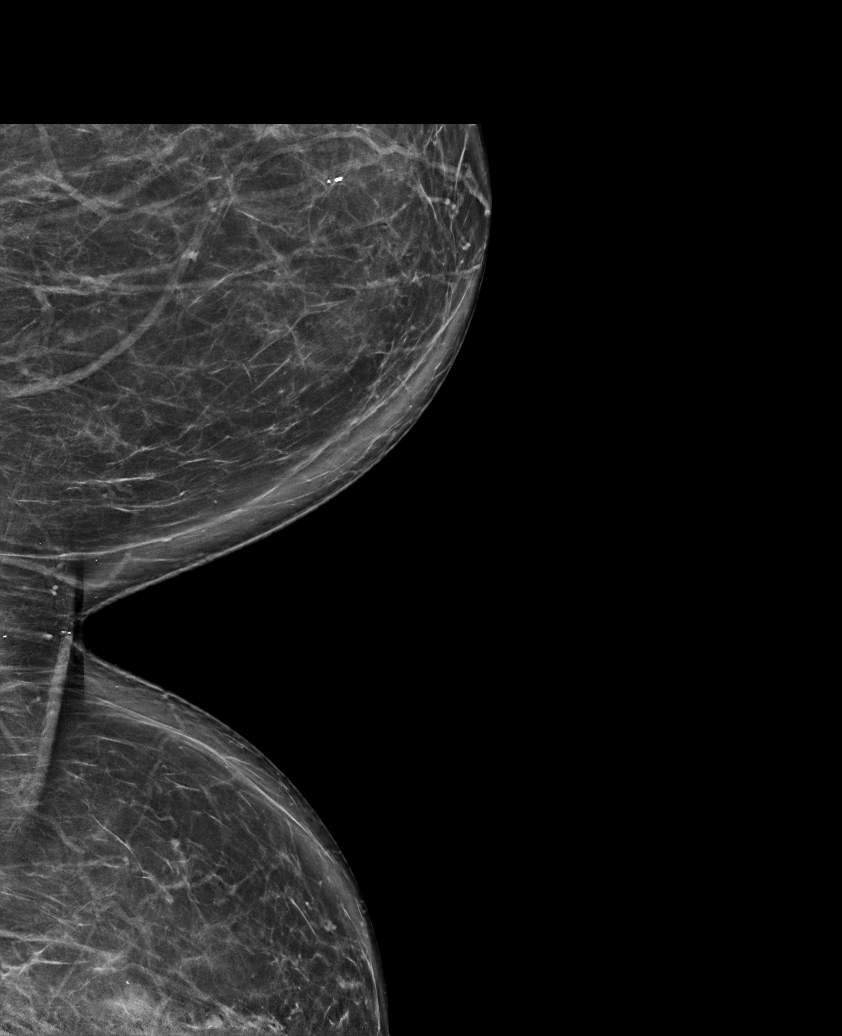

[L CC synth-2D]
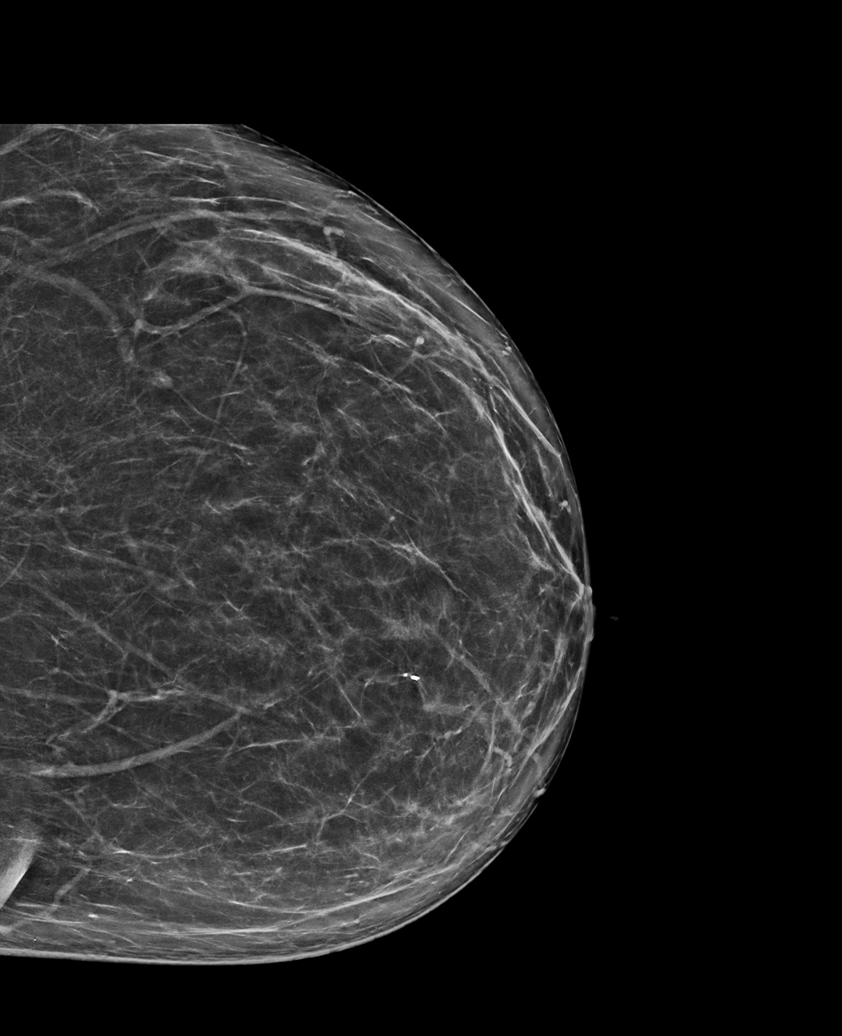

[L MLO tomo · tomo slice 47/94.0]
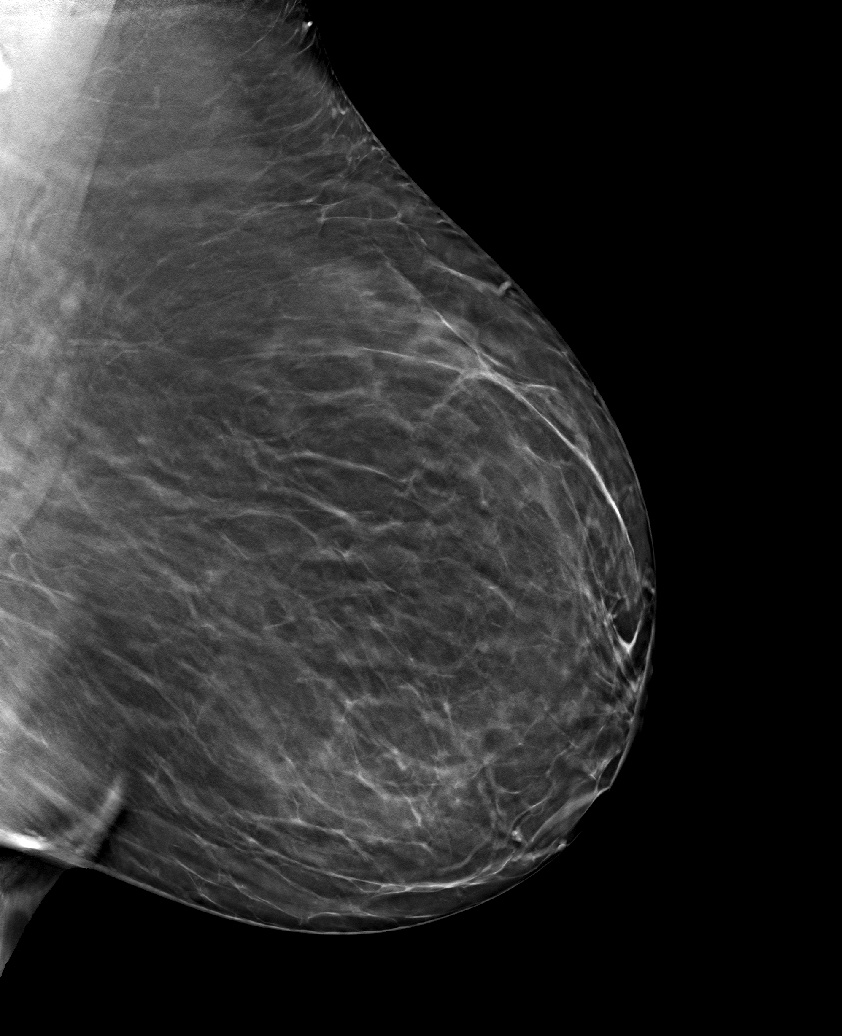

[6 of 30 positions shown; findings below may reference images not displayed]

ACR Breast Density Category b: There are scattered areas of
fibroglandular density.
FINDINGS: There are no findings suspicious for malignancy.
IMPRESSION: No mammographic evidence of malignancy. A result letter of this
screening mammogram will be mailed directly to the patient.

RECOMMENDATION:
Screening mammogram in one year. (Code:51-O-LD2)

BI-RADS CATEGORY  1: Negative.

## 2022-06-12 ENCOUNTER — Encounter (INDEPENDENT_AMBULATORY_CARE_PROVIDER_SITE_OTHER): Payer: Self-pay

## 2022-06-28 ENCOUNTER — Ambulatory Visit: Payer: Self-pay

## 2022-06-28 NOTE — Patient Outreach (Signed)
  Care Coordination   Initial Visit Note   06/28/2022 Name: Jennifer Tyler MRN: [id number] DOB: [birth year]  Jennifer Tyler is a 71 y.o. year old female who sees Daisy Floro, MD for primary care. I spoke with  Luther Parody by phone today.  What matters to the patients health and wellness today?  No Concerns Expressed     SDOH assessments and interventions completed:  Yes  SDOH Interventions Today    Flowsheet Row Most Recent Value  SDOH Interventions   Food Insecurity Interventions Intervention Not Indicated  Transportation Interventions Intervention Not Indicated        Care Coordination Interventions Activated:  No  Care Coordination Interventions:  No, not indicated   Follow up plan: No further intervention required.   Encounter Outcome:  Patient Visit Completed    Katha Cabal [facility] [phone]

## 2022-09-06 DIAGNOSIS — E559 Vitamin D deficiency, unspecified: Secondary | ICD-10-CM | POA: Diagnosis not present

## 2022-09-06 DIAGNOSIS — I10 Essential (primary) hypertension: Secondary | ICD-10-CM | POA: Diagnosis not present

## 2022-09-06 DIAGNOSIS — Z23 Encounter for immunization: Secondary | ICD-10-CM | POA: Diagnosis not present

## 2022-09-06 DIAGNOSIS — E7800 Pure hypercholesterolemia, unspecified: Secondary | ICD-10-CM | POA: Diagnosis not present

## 2022-09-06 DIAGNOSIS — M810 Age-related osteoporosis without current pathological fracture: Secondary | ICD-10-CM | POA: Diagnosis not present

## 2022-09-06 DIAGNOSIS — Z0000 Encounter for general adult medical examination without abnormal findings: Secondary | ICD-10-CM | POA: Diagnosis not present

## 2022-09-23 DIAGNOSIS — H40023 Open angle with borderline findings, high risk, bilateral: Secondary | ICD-10-CM | POA: Diagnosis not present

## 2022-09-23 DIAGNOSIS — Z961 Presence of intraocular lens: Secondary | ICD-10-CM | POA: Diagnosis not present

## 2022-09-23 DIAGNOSIS — H1045 Other chronic allergic conjunctivitis: Secondary | ICD-10-CM | POA: Diagnosis not present

## 2022-09-23 DIAGNOSIS — H04123 Dry eye syndrome of bilateral lacrimal glands: Secondary | ICD-10-CM | POA: Diagnosis not present

## 2023-02-13 ENCOUNTER — Other Ambulatory Visit: Payer: Self-pay | Admitting: Family Medicine

## 2023-02-13 DIAGNOSIS — Z1231 Encounter for screening mammogram for malignant neoplasm of breast: Secondary | ICD-10-CM

## 2023-03-26 ENCOUNTER — Ambulatory Visit
Admission: RE | Admit: 2023-03-26 | Discharge: 2023-03-26 | Disposition: A | Discharge status: Home / Self Care | Payer: Medicare Other | Source: Ambulatory Visit | Attending: Family Medicine | Admitting: Family Medicine

## 2023-03-26 DIAGNOSIS — Z1231 Encounter for screening mammogram for malignant neoplasm of breast: Secondary | ICD-10-CM

## 2023-09-24 DIAGNOSIS — M810 Age-related osteoporosis without current pathological fracture: Secondary | ICD-10-CM | POA: Diagnosis not present

## 2023-09-24 DIAGNOSIS — E7800 Pure hypercholesterolemia, unspecified: Secondary | ICD-10-CM | POA: Diagnosis not present

## 2023-09-24 DIAGNOSIS — Z0000 Encounter for general adult medical examination without abnormal findings: Secondary | ICD-10-CM | POA: Diagnosis not present

## 2023-09-24 DIAGNOSIS — E559 Vitamin D deficiency, unspecified: Secondary | ICD-10-CM | POA: Diagnosis not present

## 2023-09-24 DIAGNOSIS — I10 Essential (primary) hypertension: Secondary | ICD-10-CM | POA: Diagnosis not present

## 2023-09-24 DIAGNOSIS — K2930 Chronic superficial gastritis without bleeding: Secondary | ICD-10-CM | POA: Diagnosis not present

## 2023-09-25 ENCOUNTER — Other Ambulatory Visit: Payer: Self-pay | Admitting: Family Medicine

## 2023-09-25 DIAGNOSIS — M810 Age-related osteoporosis without current pathological fracture: Secondary | ICD-10-CM

## 2023-09-29 DIAGNOSIS — H04123 Dry eye syndrome of bilateral lacrimal glands: Secondary | ICD-10-CM | POA: Diagnosis not present

## 2023-09-29 DIAGNOSIS — Z961 Presence of intraocular lens: Secondary | ICD-10-CM | POA: Diagnosis not present

## 2023-09-29 DIAGNOSIS — H1045 Other chronic allergic conjunctivitis: Secondary | ICD-10-CM | POA: Diagnosis not present

## 2023-09-29 DIAGNOSIS — H40023 Open angle with borderline findings, high risk, bilateral: Secondary | ICD-10-CM | POA: Diagnosis not present

## 2023-12-11 DIAGNOSIS — H1045 Other chronic allergic conjunctivitis: Secondary | ICD-10-CM | POA: Diagnosis not present

## 2023-12-11 DIAGNOSIS — H40023 Open angle with borderline findings, high risk, bilateral: Secondary | ICD-10-CM | POA: Diagnosis not present

## 2023-12-11 DIAGNOSIS — Z961 Presence of intraocular lens: Secondary | ICD-10-CM | POA: Diagnosis not present

## 2023-12-11 DIAGNOSIS — H04123 Dry eye syndrome of bilateral lacrimal glands: Secondary | ICD-10-CM | POA: Diagnosis not present

## 2024-02-25 ENCOUNTER — Other Ambulatory Visit: Payer: Self-pay | Admitting: Family Medicine

## 2024-02-25 DIAGNOSIS — Z1231 Encounter for screening mammogram for malignant neoplasm of breast: Secondary | ICD-10-CM

## 2024-03-26 ENCOUNTER — Ambulatory Visit
Admission: RE | Admit: 2024-03-26 | Discharge: 2024-03-26 | Disposition: A | Discharge status: Home / Self Care | Source: Ambulatory Visit | Attending: Family Medicine | Admitting: Family Medicine

## 2024-03-26 DIAGNOSIS — Z1231 Encounter for screening mammogram for malignant neoplasm of breast: Secondary | ICD-10-CM

## 2024-04-01 ENCOUNTER — Other Ambulatory Visit: Payer: Self-pay | Admitting: Family Medicine

## 2024-04-01 DIAGNOSIS — R928 Other abnormal and inconclusive findings on diagnostic imaging of breast: Secondary | ICD-10-CM

## 2024-04-14 ENCOUNTER — Encounter

## 2024-04-21 ENCOUNTER — Encounter

## 2024-04-28 ENCOUNTER — Ambulatory Visit
Admission: RE | Admit: 2024-04-28 | Discharge: 2024-04-28 | Disposition: A | Discharge status: Home / Self Care | Source: Ambulatory Visit | Attending: Family Medicine | Admitting: Family Medicine

## 2024-04-28 ENCOUNTER — Other Ambulatory Visit: Payer: Self-pay | Admitting: Family Medicine

## 2024-04-28 DIAGNOSIS — R921 Mammographic calcification found on diagnostic imaging of breast: Secondary | ICD-10-CM

## 2024-04-28 DIAGNOSIS — R928 Other abnormal and inconclusive findings on diagnostic imaging of breast: Secondary | ICD-10-CM | POA: Diagnosis not present

## 2024-05-03 ENCOUNTER — Ambulatory Visit
Admission: RE | Admit: 2024-05-03 | Discharge: 2024-05-03 | Disposition: A | Discharge status: Home / Self Care | Source: Ambulatory Visit | Attending: Family Medicine | Admitting: Family Medicine

## 2024-05-03 ENCOUNTER — Other Ambulatory Visit (HOSPITAL_COMMUNITY): Payer: Self-pay | Admitting: Diagnostic Radiology

## 2024-05-03 DIAGNOSIS — R921 Mammographic calcification found on diagnostic imaging of breast: Secondary | ICD-10-CM

## 2024-05-03 DIAGNOSIS — D0511 Intraductal carcinoma in situ of right breast: Secondary | ICD-10-CM | POA: Diagnosis not present

## 2024-05-03 HISTORY — PX: BREAST BIOPSY: SHX20

## 2024-05-04 LAB — SURGICAL PATHOLOGY

## 2024-05-05 ENCOUNTER — Telehealth: Payer: Self-pay | Admitting: *Deleted

## 2024-05-05 NOTE — Telephone Encounter (Signed)
 Spoke to patient to confirm afternoon  [facility] clinic appointment on 7/9, paperwork will be sent via email.  Gave location and time, also informed patient that the surgeon's office would be calling as well to get information from them similar to the packet that they will be receiving so make sure to do both.  Reminded patient that all providers will be coming to the clinic to see them HERE and if they had any questions to not hesitate to reach back out to myself or their navigators.

## 2024-05-10 ENCOUNTER — Encounter: Payer: Self-pay | Admitting: *Deleted

## 2024-05-10 DIAGNOSIS — D0511 Intraductal carcinoma in situ of right breast: Secondary | ICD-10-CM | POA: Insufficient documentation

## 2024-05-11 NOTE — Progress Notes (Incomplete)
 Radiation Oncology         [phone] ________________________________  Multidisciplinary Breast Oncology Clinic [facility]) Initial Outpatient Consultation  Name: Jennifer Tyler MRN: [id number]  Date: 05/12/2024  DOB: 11/06/1950  RR:Mndd, [name] Redbird, MD  Ebbie Cough, MD   REFERRING PHYSICIAN: Ebbie Cough, MD  DIAGNOSIS: There were no encounter diagnoses.  Stage 0 (cTis (DCIS), cN0, cM0) Right Breast LIQ, Intermediate grade ductal carcinoma in situ, ER+ / PR+ / Her2 not assessed  No diagnosis found.  HISTORY OF PRESENT ILLNESS::Jennifer Tyler is a 73 y.o. female who is presenting to the office today for evaluation of her newly diagnosed right breast cancer. She is accompanied by ***. She is doing well overall.   She had routine screening mammography on 03/26/24 showing a possible abnormality in the right breast.} She underwent bilateral diagnostic mammography [facility]24 which further revealed an indeterminate group of calcifications in the lower inner right breast spanning approximately 1.0 cm in the greatest extent.  Biopsy of the lower inner right breast calcs on 05/03/24 showed: intermediate grade DCIS measuring 8 mm in the greatest linear extent of the sample with focal necrosis (along with fibrocystic changes and calcifications). Prognostic indicators significant for: estrogen receptor, 95% positive and progesterone receptor, 90% positive, both with strong staining intensity. Her2 not assessed.   Menarche: *** years old Age at first live birth: *** years old GP: *** LMP: *** Contraceptive: *** HRT: ***   The patient was referred today for presentation in the multidisciplinary conference.  Radiology studies and pathology slides were presented there for review and discussion of treatment options.  A consensus was discussed regarding potential next steps.  PREVIOUS RADIATION THERAPY: No  PAST MEDICAL HISTORY:  Past Medical History:   Diagnosis Date   Back pain    Gallbladder problem    Hypertension    Swelling     PAST SURGICAL HISTORY: Past Surgical History:  Procedure Laterality Date   ABDOMINAL HYSTERECTOMY     BREAST BIOPSY     BREAST BIOPSY Right 05/03/2024   MM RT BREAST BX W LOC DEV 1ST LESION IMAGE BX SPEC STEREO GUIDE 05/03/2024 [facility]   BUNIONECTOMY  2016   R   CHOLECYSTECTOMY      FAMILY HISTORY:  Family History  Problem Relation Age of Onset   Heart disease Mother    Breast cancer Sister     SOCIAL HISTORY:  Social History   Socioeconomic History   Marital status: Married    Spouse name: Augusto   Number of children: 2   Years of education: Not on file   Highest education level: Not on file  Occupational History   Occupation: Retired  Tobacco Use   Smoking status: Never   Smokeless tobacco: Never  Vaping Use   Vaping status: Never Used  Substance and Sexual Activity   Alcohol use: Not Currently   Drug use: Never   Sexual activity: Not on file  Other Topics Concern   Not on file  Social History Narrative   Not on file   Social Drivers of Health   Financial Resource Strain: Not on file  Food Insecurity: No Food Insecurity (06/28/2022)   Hunger Vital Sign    Worried About Running Out of Food in the Last Year: Never true    Ran Out of Food in the Last Year: Never true  Transportation Needs: No Transportation Needs (06/28/2022)   PRAPARE - Transportation    Lack  of Transportation (Medical): No    Lack of Transportation (Non-Medical): No  Physical Activity: Not on file  Stress: Not on file  Social Connections: Not on file    ALLERGIES:  Allergies  Allergen Reactions   Hydrocodone-Acetaminophen Rash    MEDICATIONS:  Current Outpatient Medications  Medication Sig Dispense Refill   buPROPion  (WELLBUTRIN  SR) 200 MG 12 hr tablet Take 1 tablet (200 mg total) by mouth 2 (two) times daily. 180 tablet 1   calcium gluconate 500 MG tablet Take 1 tablet by mouth  daily.     hydrochlorothiazide (HYDRODIURIL) 25 MG tablet Take 25 mg by mouth daily.     lisinopril (PRINIVIL,ZESTRIL) 10 MG tablet Take 10 mg by mouth daily.     magnesium gluconate (MAGONATE) 500 MG tablet Take 500 mg by mouth 2 (two) times daily.     Multiple Vitamins-Minerals (MULTIVITAMIN WITH MINERALS) tablet Take 1 tablet by mouth daily.     naproxen sodium (ALEVE) 220 MG tablet Take 220 mg by mouth.     vitamin B-12 (CYANOCOBALAMIN) 250 MCG tablet Take 250 mcg by mouth daily.     Vitamin D , Ergocalciferol , (DRISDOL ) 1.25 MG (50000 UNIT) CAPS capsule Take 1 capsule (50,000 Units total) by mouth every 7 (seven) days. 12 capsule 0   No current facility-administered medications for this encounter.    REVIEW OF SYSTEMS: A 10+ POINT REVIEW OF SYSTEMS WAS OBTAINED including neurology, dermatology, psychiatry, cardiac, respiratory, lymph, extremities, GI, GU, musculoskeletal, constitutional, reproductive, HEENT. On the provided form, she reports ***. She denies *** and any other symptoms.    PHYSICAL EXAM:  vitals were not taken for this visit.  {may need to copy over vitals} Lungs are clear to auscultation bilaterally. Heart has regular rate and rhythm. No palpable cervical, supraclavicular, or axillary adenopathy. Abdomen soft, non-tender, normal bowel sounds. Breast: Left breast with no palpable mass, nipple discharge, or bleeding. Right breast with ***.   KPS = ***  100 - Normal; no complaints; no evidence of disease. 90   - Able to carry on normal activity; minor signs or symptoms of disease. 80   - Normal activity with effort; some signs or symptoms of disease. 24   - Cares for self; unable to carry on normal activity or to do active work. 60   - Requires occasional assistance, but is able to care for most of his personal needs. 50   - Requires considerable assistance and frequent medical care. 40   - Disabled; requires special care and assistance. 30   - Severely disabled; hospital  admission is indicated although death not imminent. 20   - Very sick; hospital admission necessary; active supportive treatment necessary. 10   - Moribund; fatal processes progressing rapidly. 0     - Dead  Karnofsky DA, Abelmann WH, Craver LS and Burchenal [facility] [phone]) The use of the nitrogen mustards in the palliative treatment of carcinoma: with particular reference to bronchogenic carcinoma Cancer 1 [phone]  LABORATORY DATA:  Lab Results  Component Value Date   WBC 4.9 11/11/2019   HGB 15.2 11/11/2019   HCT 44.7 11/11/2019   MCV 86 11/11/2019   PLT 278 11/11/2019   Lab Results  Component Value Date   NA 134 09/20/2021   K 4.2 09/20/2021   CL 94 (L) 09/20/2021   CO2 28 09/20/2021   Lab Results  Component Value Date   ALT 17 09/20/2021   AST 19 09/20/2021   ALKPHOS 86 09/20/2021   BILITOT 0.4 09/20/2021  PULMONARY FUNCTION TEST:   Review Flowsheet        No data to display          RADIOGRAPHY: MM RT BREAST BX W LOC DEV 1ST LESION IMAGE BX SPEC STEREO GUIDE Addendum Date: 05/05/2024 ADDENDUM REPORT: 05/05/2024 08:03 ADDENDUM: Pathology revealed DUCTAL CARCINOMA IN SITU, INTERMEDIATE GRADE, NECROSIS: PRESENT, FOCAL, CALCIFICATIONS: PRESENT, OTHER FINDINGS: FIBROCYSTIC CHANGE WITH CALCIFICATIONS of the RIGHT breast, lower inner quadrant, (ribbon clip). This was found to be concordant by Dr. Dina Arceo. Pathology results were discussed with the patient and her husband by telephone. The patient reported doing well after the biopsy with minimal tenderness at the site. Post biopsy instructions and care were reviewed and questions were answered. The patient was encouraged to call [facility] for any additional concerns. My direct phone number was provided. The patient was referred to [facility] on May 12, 2024. Pathology results reported by Hendricks Benders, RN on 05/04/2024.  Electronically Signed   By: Dina  Arceo M.D.   On: 05/05/2024 08:03   Result Date: 05/05/2024 CLINICAL DATA:  Right breast calcifications. EXAM: RIGHT BREAST STEREOTACTIC CORE NEEDLE BIOPSY COMPARISON:  Previous exam(s). FINDINGS: The patient and I discussed the procedure of stereotactic-guided biopsy including benefits and alternatives. We discussed the high likelihood of a successful procedure. We discussed the risks of the procedure including infection, bleeding, tissue injury, clip migration, and inadequate sampling. Informed written consent was given. The usual time out protocol was performed immediately prior to the procedure. Using sterile technique and 1% lidocaine and 1% lidocaine with epinephrine as local anesthetic, under stereotactic guidance, a 9 gauge vacuum assisted device was used to perform core needle biopsy of calcifications in the lower inner quadrant of the right breast using a medial to lateral approach. Specimen radiograph was performed showing calcifications are present in the tissue samples. Specimens with calcifications are identified for pathology. Lesion quadrant: Lower inner quadrant At the conclusion of the procedure, ribbon shaped tissue marker clip was deployed into the biopsy cavity. Follow-up 2-view mammogram was performed and dictated separately. IMPRESSION: Stereotactic-guided biopsy of the right breast. No apparent complications. Electronically Signed: By: Dina  Arceo M.D. On: 05/03/2024 12:07   MM CLIP PLACEMENT RIGHT Result Date: 05/03/2024 CLINICAL DATA:  Status post stereotactic biopsy of right breast calcifications. EXAM: 3D DIAGNOSTIC RIGHT MAMMOGRAM POST STEREOTACTIC BIOPSY COMPARISON:  Previous exam(s). ACR Breast Density Category b: There are scattered areas of fibroglandular density. FINDINGS: 3D Mammographic images were obtained following stereotactic guided biopsy of the right breast. The ribbon shaped biopsy marker clip is located approximately 1.2 cm lateral to  the biopsied calcifications. IMPRESSION: Status post stereotactic biopsy of right breast calcifications. The ribbon shaped clip is located 1.2 cm lateral to the biopsied calcifications. Final Assessment: Post Procedure Mammograms for Marker Placement Electronically Signed   By: Harrie Deis M.D.   On: 05/03/2024 12:07   MM Digital Diagnostic Unilat R Result Date: 04/28/2024 CLINICAL DATA:  Recall from screening mammography, calcifications involving LOWER INNER QUADRANT of the RIGHT breast at middle depth. EXAM: DIGITAL DIAGNOSTIC UNILATERAL RIGHT MAMMOGRAM TECHNIQUE: Right digital diagnostic mammography was performed. COMPARISON:  Previous exam(s). ACR Breast Density Category b: There are scattered areas of fibroglandular density. FINDINGS: Spot magnification CC and mediolateral views of the calcifications and a full field mediolateral view were obtained. A group of faint fine pleomorphic calcifications is present in the LOWER INNER QUADRANT at middle depth  spanning approximately 1.0 cm. IMPRESSION: Indeterminate 1.0 cm group of faint fine pleomorphic calcifications in the LOWER INNER QUADRANT of the RIGHT breast at middle. RECOMMENDATION: Stereotactic tomosynthesis core needle biopsy of the RIGHT breast calcifications. I have discussed the findings and recommendations with the patient. The stereotactic tomosynthesis core needle biopsy procedure was discussed with the patient her questions were answered. She wishes to proceed with the biopsy which has been scheduled by [facility] staff. BI-RADS CATEGORY  4: Suspicious. Electronically Signed   By: Debby Satterfield M.D.   On: 04/28/2024 11:59      IMPRESSION: Stage 0 (cTis (DCIS), cN0, cM0) Right Breast LIQ, Intermediate grade ductal carcinoma in situ, ER+ / PR+ / Her2 not assessed  Patient will be a good candidate for breast conservation with radiotherapy to the right breast. We discussed the general course of radiation, potential  side effects, and toxicities with radiation and the patient is interested in this approach. ***   PLAN:  *** (copy notes from board at conference)   ------------------------------------------------  Lynwood CHARM Nasuti, PhD, MD  This document serves as a record of services personally performed by Lynwood Nasuti, MD. It was created on his behalf by Dorthy Fuse, a trained medical scribe. The creation of this record is based on the scribe's personal observations and the provider's statements to them. This document has been checked and approved by the attending provider.

## 2024-05-12 ENCOUNTER — Inpatient Hospital Stay: Admitting: Genetic Counselor

## 2024-05-12 ENCOUNTER — Ambulatory Visit
Admission: RE | Admit: 2024-05-12 | Discharge: 2024-05-12 | Disposition: A | Discharge status: Home / Self Care | Source: Ambulatory Visit | Attending: Radiation Oncology | Admitting: Radiation Oncology

## 2024-05-12 ENCOUNTER — Encounter: Payer: Self-pay | Admitting: Genetic Counselor

## 2024-05-12 ENCOUNTER — Encounter: Payer: Self-pay | Admitting: *Deleted

## 2024-05-12 ENCOUNTER — Inpatient Hospital Stay: Attending: Hematology

## 2024-05-12 ENCOUNTER — Ambulatory Visit: Admitting: Physical Therapy

## 2024-05-12 ENCOUNTER — Encounter: Payer: Self-pay | Admitting: Hematology

## 2024-05-12 ENCOUNTER — Inpatient Hospital Stay: Admitting: Hematology

## 2024-05-12 VITALS — BP 151/81 | HR 70 | Temp 98.5°F | Resp 16 | Ht 59.0 in | Wt 196.6 lb

## 2024-05-12 DIAGNOSIS — Z9049 Acquired absence of other specified parts of digestive tract: Secondary | ICD-10-CM | POA: Diagnosis not present

## 2024-05-12 DIAGNOSIS — I10 Essential (primary) hypertension: Secondary | ICD-10-CM | POA: Diagnosis not present

## 2024-05-12 DIAGNOSIS — D0511 Intraductal carcinoma in situ of right breast: Secondary | ICD-10-CM

## 2024-05-12 DIAGNOSIS — Z806 Family history of leukemia: Secondary | ICD-10-CM | POA: Diagnosis not present

## 2024-05-12 DIAGNOSIS — Z170 Estrogen receptor positive status [ER+]: Secondary | ICD-10-CM | POA: Diagnosis not present

## 2024-05-12 DIAGNOSIS — Z90710 Acquired absence of both cervix and uterus: Secondary | ICD-10-CM | POA: Diagnosis not present

## 2024-05-12 DIAGNOSIS — Z1721 Progesterone receptor positive status: Secondary | ICD-10-CM | POA: Insufficient documentation

## 2024-05-12 DIAGNOSIS — Z803 Family history of malignant neoplasm of breast: Secondary | ICD-10-CM

## 2024-05-12 LAB — CBC WITH DIFFERENTIAL (CANCER CENTER ONLY)
Abs Immature Granulocytes: 0.01 K/uL (ref 0.00–0.07)
Basophils Absolute: 0 K/uL (ref 0.0–0.1)
Basophils Relative: 1 %
Eosinophils Absolute: 0.1 K/uL (ref 0.0–0.5)
Eosinophils Relative: 1 %
HCT: 41.7 % (ref 36.0–46.0)
Hemoglobin: 13.3 g/dL (ref 12.0–15.0)
Immature Granulocytes: 0 %
Lymphocytes Relative: 30 %
Lymphs Abs: 1.5 K/uL (ref 0.7–4.0)
MCH: 28.4 pg (ref 26.0–34.0)
MCHC: 31.9 g/dL (ref 30.0–36.0)
MCV: 88.9 fL (ref 80.0–100.0)
Monocytes Absolute: 0.5 K/uL (ref 0.1–1.0)
Monocytes Relative: 11 %
Neutro Abs: 2.9 K/uL (ref 1.7–7.7)
Neutrophils Relative %: 57 %
Platelet Count: 256 K/uL (ref 150–400)
RBC: 4.69 MIL/uL (ref 3.87–5.11)
RDW: 13.9 % (ref 11.5–15.5)
WBC Count: 5 K/uL (ref 4.0–10.5)
nRBC: 0 % (ref 0.0–0.2)

## 2024-05-12 LAB — CMP (CANCER CENTER ONLY)
ALT: 14 U/L (ref 0–44)
AST: 17 U/L (ref 15–41)
Albumin: 4 g/dL (ref 3.5–5.0)
Alkaline Phosphatase: 71 U/L (ref 38–126)
Anion gap: 5 (ref 5–15)
BUN: 13 mg/dL (ref 8–23)
CO2: 31 mmol/L (ref 22–32)
Calcium: 9.5 mg/dL (ref 8.9–10.3)
Chloride: 104 mmol/L (ref 98–111)
Creatinine: 0.68 mg/dL (ref 0.44–1.00)
GFR, Estimated: >60 mL/min (ref >60)
Glucose, Bld: 81 mg/dL (ref 70–99)
Potassium: 3.6 mmol/L (ref 3.5–5.1)
Sodium: 140 mmol/L (ref 135–145)
Total Bilirubin: 0.5 mg/dL (ref 0.0–1.2)
Total Protein: 6.9 g/dL (ref 6.5–8.1)

## 2024-05-12 LAB — GENETIC SCREENING ORDER

## 2024-05-12 NOTE — Progress Notes (Signed)
 REFERRING PROVIDER: Lanny Callander, MD  PRIMARY PROVIDER:  Okey [name] Redbird, MD  PRIMARY REASON FOR VISIT:  1. Family history of breast cancer   2. Ductal carcinoma in situ (DCIS) of right breast    HISTORY OF PRESENT ILLNESS:   Jennifer Tyler, a 73 y.o. female, was seen for a [place] cancer genetics consultation at the request of Dr. Lanny due to a personal and family history of cancer.  Jennifer Tyler presents to clinic today to discuss the possibility of a hereditary predisposition to cancer, to discuss genetic testing, and to further clarify her future cancer risks, as well as potential cancer risks for family members.   In July 2025, at the age of 78, Jennifer Tyler was diagnosed with ductal carcinoma in situ of the right breast (ER/PR positive).  CANCER HISTORY:  Oncology History  Ductal carcinoma in situ (DCIS) of right breast  05/03/2024 Cancer Staging   Staging form: Breast, AJCC 8th Edition - Clinical stage from 05/03/2024: Stage 0 (cTis (DCIS), cN0, cM0, G2, ER+, PR+) - Signed by Lanny Callander, MD on 05/11/2024 Stage prefix: Initial diagnosis Histologic grading system: 3 grade system   05/10/2024 Initial Diagnosis   Ductal carcinoma in situ (DCIS) of right breast    Past Medical History:  Diagnosis Date   Back pain    Gallbladder problem    Hypertension    Swelling     Past Surgical History:  Procedure Laterality Date   ABDOMINAL HYSTERECTOMY     BREAST BIOPSY     BREAST BIOPSY Right 05/03/2024   MM RT BREAST BX W LOC DEV 1ST LESION IMAGE BX SPEC STEREO GUIDE 05/03/2024 [facility]   BUNIONECTOMY  2016   R   CHOLECYSTECTOMY      Social History   Socioeconomic History   Marital status: Married    Spouse name: Augusto   Number of children: 2   Years of education: Not on file   Highest education level: Not on file  Occupational History   Occupation: Retired  Tobacco Use   Smoking status: Never   Smokeless tobacco: Never  Vaping Use   Vaping status: Never Used  Substance  and Sexual Activity   Alcohol use: Not Currently    Comment: Sociial drinker   Drug use: Never   Sexual activity: Not on file  Other Topics Concern   Not on file  Social History Narrative   Not on file   Social Drivers of Health   Financial Resource Strain: Not on file  Food Insecurity: No Food Insecurity (06/28/2022)   Hunger Vital Sign    Worried About Running Out of Food in the Last Year: Never true    Ran Out of Food in the Last Year: Never true  Transportation Needs: No Transportation Needs (06/28/2022)   PRAPARE - Administrator, Civil Service (Medical): No    Lack of Transportation (Non-Medical): No  Physical Activity: Not on file  Stress: Not on file  Social Connections: Not on file     FAMILY HISTORY:  We obtained a detailed, 4-generation family history.  Significant diagnoses are listed below: Family History  Problem Relation Age of Onset   Heart disease Mother    Breast cancer Sister 59   Leukemia Maternal Grandmother    Breast cancer Maternal Cousin        first cousin   Leukemia Maternal Cousin        first cousin     Jennifer Tyler is unaware  of previous family history of genetic testing for hereditary cancer risks. There is no reported Ashkenazi Jewish ancestry.   GENETIC COUNSELING ASSESSMENT: Jennifer Tyler is a 73 y.o. female with a personal and family history of cancer which is somewhat suggestive of a hereditary predisposition to cancer. We, therefore, discussed and recommended the following at today's visit.   DISCUSSION: We discussed that 5 - 10% of cancer is hereditary, with most cases of breast cancer associated with BRCA1/2.  There are other genes that can be associated with hereditary breast cancer syndromes.  We discussed that testing is beneficial for several reasons including knowing how to follow individuals after completing their treatment, identifying whether potential treatment options would be beneficial, and understanding if other family  members could be at risk for cancer and allowing them to undergo genetic testing.   We reviewed the characteristics, features and inheritance patterns of hereditary cancer syndromes. We also discussed genetic testing, including the appropriate family members to test, the process of testing, insurance coverage and turn-around-time for results. We discussed the implications of a negative, positive, carrier and/or variant of uncertain significant result. We recommended Jennifer Tyler pursue genetic testing for a panel that includes genes associated with breast cancer.   Based on Jennifer Tyler personal and family history of cancer, she meets medical criteria for genetic testing. Despite that she meets criteria, she may still have an out of pocket cost. If her out of pocket cost for testing is over $100, the laboratory should contact them to discuss self-pay prices, patient pay assistance programs, if applicable, and other billing options.  PLAN: Despite our recommendation, Jennifer Tyler did not wish to pursue genetic testing at today's visit. We understand this decision and remain available to coordinate genetic testing at any time in the future. We, therefore, recommend Jennifer Tyler continue to follow the cancer screening guidelines given by her primary healthcare provider.  Jennifer Tyler questions were answered to her satisfaction today. Our contact information was provided should additional questions or concerns arise. Thank you for the referral and allowing us  to share in the care of your patient.   Shashana Fullington, MS, [facility] Genetic Counselor [email or web address] [phone]) [phone]  <15 minutes were spent on the date of the encounter in service to the patient including preparation, face-to-face consultation, documentation and care coordination.  The patient brought her sister and friend.  Drs. Gudena and/or Lanny were available to discuss this case as needed.    _______________________________________________________________________ For Office Staff:  Number of people involved in session: 3 Was an Intern/ student involved with case: no

## 2024-05-12 NOTE — Addendum Note (Signed)
 Encounter addended by: Shannon Agent, MD on: 05/12/2024 4:45 PM  Actions taken: Clinical Note Signed

## 2024-05-12 NOTE — Progress Notes (Signed)
 [facility]   [phone] [phone]   Clinic New Consult Note   Patient Care Team: Okey [name] Redbird, MD as PCP - General (Family Medicine) Tyree Nanetta SAILOR, RN as Oncology Nurse Navigator Gerome, Devere HERO, RN as Oncology Nurse Navigator Ebbie Cough, MD as Consulting Physician (General Surgery) Lanny Callander, MD as Consulting Physician (Hematology) Shannon Agent, MD as Consulting Physician (Radiation Oncology) 05/12/2024  CHIEF COMPLAINTS/PURPOSE OF CONSULTATION:  Newly diagnosed right breast DCIS  REFERRING PHYSICIAN: Breast center  Discussed the use of AI scribe software for clinical note transcription with the patient, who gave verbal consent to proceed.  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  She presents to our multidisciplinary breast clinic today with her family.  The ductal carcinoma in situ (DCIS) of the right breast was discovered during a routine annual mammogram on Mar 26, 2024, which showed calcification in the right breast.  There are no palpable lumps, skin changes, or nipple discharge. She underwent diagnostic mammogram on April 28, 2024, which showed indeterminate 1 cm group of fine pleomorphic calcifications in the lower inner quadrant of the right breast.  She underwent a biopsy, which showed grade 2 DCIS, ER 95% positive, PR 90% positive.  Her family history includes breast cancer in her older sister and a maternal cousin. She is retired, previously worked in an [employer] pl[employer], and lives with her husband. She occasionally drinks alcohol and has never smoked.  Her past medical history includes hypertension, managed with losartan 25 mg daily. She has undergone gallbladder removal and hysterectomy due to fibroids. Her current medications include vitamin D , Wellbutrin , calcium, hydrochlorothiazide, losartan, magnesium,  multivitamin, Naprosyn, Aleve as needed, B12, pantoprazole, and raloxifene. She reports normal appetite and energy levels.     MEDICAL HISTORY:  Past Medical History:  Diagnosis Date   Back pain    Gallbladder problem    Hypertension    Swelling     SURGICAL HISTORY: Past Surgical History:  Procedure Laterality Date   ABDOMINAL HYSTERECTOMY     BREAST BIOPSY     BREAST BIOPSY Right 05/03/2024   MM RT BREAST BX W LOC DEV 1ST LESION IMAGE BX SPEC STEREO GUIDE 05/03/2024 [facility]   BUNIONECTOMY  2016   R   CHOLECYSTECTOMY      SOCIAL HISTORY: Social History   Socioeconomic History   Marital status: Married    Spouse name: Augusto   Number of children: 2   Years of education: Not on file   Highest education level: Not on file  Occupational History   Occupation: Retired  Tobacco Use   Smoking status: Never   Smokeless tobacco: Never  Vaping Use   Vaping status: Never Used  Substance and Sexual Activity   Alcohol use: Not Currently    Comment: Sociial drinker   Drug use: Never   Sexual activity: Not on file  Other Topics Concern   Not on file  Social History Narrative   Not on file   Social Drivers of Health   Financial Resource Strain: Not on file  Food Insecurity: No Food Insecurity (06/28/2022)   Hunger Vital Sign    Worried About Running Out of Food in the Last Year: Never true    Ran Out of Food in the Last Year: Never true  Transportation Needs: No Transportation Needs (06/28/2022)   PRAPARE - Transportation  Lack of Transportation (Medical): No    Lack of Transportation (Non-Medical): No  Physical Activity: Not on file  Stress: Not on file  Social Connections: Not on file  Intimate Partner Violence: Not on file    FAMILY HISTORY: Family History  Problem Relation Age of Onset   Heart disease Mother    Breast cancer Sister 6   Leukemia Maternal Grandmother    Breast cancer Maternal Cousin        first cousin   Leukemia Maternal Cousin         first cousin    ALLERGIES:  is allergic to hydrocodone-acetaminophen.  MEDICATIONS:  Current Outpatient Medications  Medication Sig Dispense Refill   buPROPion  (WELLBUTRIN  SR) 200 MG 12 hr tablet Take 1 tablet (200 mg total) by mouth 2 (two) times daily. 180 tablet 1   calcium gluconate 500 MG tablet Take 1 tablet by mouth daily.     hydrochlorothiazide (HYDRODIURIL) 25 MG tablet Take 25 mg by mouth daily.     magnesium gluconate (MAGONATE) 500 MG tablet Take 500 mg by mouth 2 (two) times daily.     Multiple Vitamins-Minerals (MULTIVITAMIN WITH MINERALS) tablet Take 1 tablet by mouth daily.     naproxen sodium (ALEVE) 220 MG tablet Take 220 mg by mouth.     vitamin B-12 (CYANOCOBALAMIN) 250 MCG tablet Take 250 mcg by mouth daily.     Vitamin D , Ergocalciferol , (DRISDOL ) 1.25 MG (50000 UNIT) CAPS capsule Take 1 capsule (50,000 Units total) by mouth every 7 (seven) days. 12 capsule 0   No current facility-administered medications for this visit.    REVIEW OF SYSTEMS:   Constitutional: Denies fevers, chills or abnormal night sweats Eyes: Denies blurriness of vision, double vision or watery eyes Ears, nose, mouth, throat, and face: Denies mucositis or sore throat Respiratory: Denies cough, dyspnea or wheezes Cardiovascular: Denies palpitation, chest discomfort or lower extremity swelling Gastrointestinal:  Denies nausea, heartburn or change in bowel habits Skin: Denies abnormal skin rashes Lymphatics: Denies new lymphadenopathy or easy bruising Neurological:Denies numbness, tingling or new weaknesses Behavioral/Psych: Mood is stable, no new changes  All other systems were reviewed with the patient and are negative.  PHYSICAL EXAMINATION: ECOG PERFORMANCE STATUS: 0 - Asymptomatic  Vitals:   05/12/24 1248  BP: (!) 151/81  Pulse: 70  Resp: 16  Temp: 98.5 F (36.9 C)  SpO2: 99%   Filed Weights   05/12/24 1248  Weight: 89.2 kg (196 lb 9.6 oz)    GENERAL:alert, no  distress and comfortable SKIN: skin color, texture, turgor are normal, no rashes or significant lesions EYES: normal, conjunctiva are pink and non-injected, sclera clear OROPHARYNX:no exudate, no erythema and lips, buccal mucosa, and tongue normal  NECK: supple, thyroid normal size, non-tender, without nodularity LYMPH:  no palpable lymphadenopathy in the cervical, axillary or inguinal LUNGS: clear to auscultation and percussion with normal breathing effort HEART: regular rate & rhythm and no murmurs and no lower extremity edema ABDOMEN:abdomen soft, non-tender and normal bowel sounds Musculoskeletal:no cyanosis of digits and no clubbing  PSYCH: alert & oriented x 3 with fluent speech NEURO: no focal motor/sensory deficits  Physical Exam BREAST: Right breast normal, no tenderness.  LABORATORY DATA:  I have reviewed the data as listed    Latest Ref Rng & Units 05/12/2024   12:02 PM 11/11/2019    4:47 PM 12/24/2018    2:56 PM  CBC  WBC 4.0 - 10.5 K/uL 5.0  4.9  4.6   Hemoglobin 12.0 -  15.0 g/dL 86.6  84.7  85.7   Hematocrit 36.0 - 46.0 % 41.7  44.7  42.5   Platelets 150 - 400 K/uL 256  278  271     @cmpl @  RADIOGRAPHIC STUDIES: I have personally reviewed the radiological images as listed and agreed with the findings in the report. MM RT BREAST BX W LOC DEV 1ST LESION IMAGE BX SPEC STEREO GUIDE Addendum Date: 05/05/2024 ADDENDUM REPORT: 05/05/2024 08:03 ADDENDUM: Pathology revealed DUCTAL CARCINOMA IN SITU, INTERMEDIATE GRADE, NECROSIS: PRESENT, FOCAL, CALCIFICATIONS: PRESENT, OTHER FINDINGS: FIBROCYSTIC CHANGE WITH CALCIFICATIONS of the RIGHT breast, lower inner quadrant, (ribbon clip). This was found to be concordant by Dr. Dina Arceo. Pathology results were discussed with the patient and her husband by telephone. The patient reported doing well after the biopsy with minimal tenderness at the site. Post biopsy instructions and care were reviewed and questions were answered. The patient  was encouraged to call [facility] for any additional concerns. My direct phone number was provided. The patient was referred to [facility] on May 12, 2024. Pathology results reported by Hendricks Benders, RN on 05/04/2024. Electronically Signed   By: Dina  Arceo M.D.   On: 05/05/2024 08:03   Result Date: 05/05/2024 CLINICAL DATA:  Right breast calcifications. EXAM: RIGHT BREAST STEREOTACTIC CORE NEEDLE BIOPSY COMPARISON:  Previous exam(s). FINDINGS: The patient and I discussed the procedure of stereotactic-guided biopsy including benefits and alternatives. We discussed the high likelihood of a successful procedure. We discussed the risks of the procedure including infection, bleeding, tissue injury, clip migration, and inadequate sampling. Informed written consent was given. The usual time out protocol was performed immediately prior to the procedure. Using sterile technique and 1% lidocaine and 1% lidocaine with epinephrine as local anesthetic, under stereotactic guidance, a 9 gauge vacuum assisted device was used to perform core needle biopsy of calcifications in the lower inner quadrant of the right breast using a medial to lateral approach. Specimen radiograph was performed showing calcifications are present in the tissue samples. Specimens with calcifications are identified for pathology. Lesion quadrant: Lower inner quadrant At the conclusion of the procedure, ribbon shaped tissue marker clip was deployed into the biopsy cavity. Follow-up 2-view mammogram was performed and dictated separately. IMPRESSION: Stereotactic-guided biopsy of the right breast. No apparent complications. Electronically Signed: By: Dina  Arceo M.D. On: 05/03/2024 12:07   MM CLIP PLACEMENT RIGHT Result Date: 05/03/2024 CLINICAL DATA:  Status post stereotactic biopsy of right breast calcifications. EXAM: 3D DIAGNOSTIC RIGHT MAMMOGRAM  POST STEREOTACTIC BIOPSY COMPARISON:  Previous exam(s). ACR Breast Density Category b: There are scattered areas of fibroglandular density. FINDINGS: 3D Mammographic images were obtained following stereotactic guided biopsy of the right breast. The ribbon shaped biopsy marker clip is located approximately 1.2 cm lateral to the biopsied calcifications. IMPRESSION: Status post stereotactic biopsy of right breast calcifications. The ribbon shaped clip is located 1.2 cm lateral to the biopsied calcifications. Final Assessment: Post Procedure Mammograms for Marker Placement Electronically Signed   By: Harrie Deis M.D.   On: 05/03/2024 12:07   MM Digital Diagnostic Unilat R Result Date: 04/28/2024 CLINICAL DATA:  Recall from screening mammography, calcifications involving LOWER INNER QUADRANT of the RIGHT breast at middle depth. EXAM: DIGITAL DIAGNOSTIC UNILATERAL RIGHT MAMMOGRAM TECHNIQUE: Right digital diagnostic mammography was performed. COMPARISON:  Previous exam(s). ACR Breast Density Category b: There are scattered areas of fibroglandular density. FINDINGS: Spot magnification CC and mediolateral views  of the calcifications and a full field mediolateral view were obtained. A group of faint fine pleomorphic calcifications is present in the LOWER INNER QUADRANT at middle depth spanning approximately 1.0 cm. IMPRESSION: Indeterminate 1.0 cm group of faint fine pleomorphic calcifications in the LOWER INNER QUADRANT of the RIGHT breast at middle. RECOMMENDATION: Stereotactic tomosynthesis core needle biopsy of the RIGHT breast calcifications. I have discussed the findings and recommendations with the patient. The stereotactic tomosynthesis core needle biopsy procedure was discussed with the patient her questions were answered. She wishes to proceed with the biopsy which has been scheduled by [facility] staff. BI-RADS CATEGORY  4: Suspicious. Electronically Signed   By: Debby Satterfield  M.D.   On: 04/28/2024 11:59     Assessment & Plan Ductal carcinoma in situ (DCIS) of right breast, ER+/PR+ Newly diagnosed DCIS of the right breast, identified on routine mammogram. The lesion is small, measuring 1.0 cm, and is intermediate grade (grade 2) with focal necrosis. It is ER and PR positive, indicating potential benefit from anti-estrogen therapy. There is no evidence of invasive cancer, and the condition is confined to the ducts, not involving lymph nodes or other tissues.  - We discussed that DCIS will be cured by surgery alone, however it is a high risk for future breast cancer including invasive breast cancer.  The risk of future breast cancer is approximately 12% over the next 10 years, increasing to about 20% over 20 years. Treatment options include surgery, radiation, and/or low-dose tamoxifen to reduce future risk. Radiation and tamoxifen each reduce the risk by about half, with radiation reducing it to 5% and tamoxifen to 6%. If both are used, the risk drops to 2%. - Proceed with surgical consultation with Dr. Ebbie for outpatient procedure. - Consider post-surgical radiation therapy; consult with Dr. Shannon for details. - Consider low-dose tamoxifen (5 mg daily for 3 years) to reduce future breast cancer risk. -Due to her advanced age, she probably does not need both adjuvant radiation and tamoxifen. - Discuss genetic testing and meet with a genetic counselor. - Schedule annual mammograms for ongoing surveillance.  Hypertension Hypertension is managed with losartan 25 mg and hydrochlorothiazide. Blood pressure control is well-managed with current regimen.  History of cholecystectomy History of cholecystectomy; no current issues related to gallbladder removal.  History of hysterectomy due to fibroids History of hysterectomy performed due to fibroids.  History of sciatica Sciatica reported 20 years ago, currently not problematic.  PLAN - Imaging and biopsy results  reviewed with patient - She will proceed with right breast lumpectomy soon - Adjuvant radiation is optional, she was seen By Dr. Brigid today  - Follow-up with me after surgery, to start low-dose tamoxifen.   No orders of the defined types were placed in this encounter.   All questions were answered. The patient knows to call the clinic with any problems, questions or concerns. I spent 40 minutes counseling the patient face to face. The total time spent in the appointment was 45 minutes including review of chart and various tests results, discussions about plan of care and coordination of care plan.     Onita Mattock, MD 05/12/2024 6:01 PM

## 2024-05-13 ENCOUNTER — Other Ambulatory Visit: Payer: Self-pay | Admitting: General Surgery

## 2024-05-13 ENCOUNTER — Encounter: Payer: Self-pay | Admitting: General Practice

## 2024-05-13 DIAGNOSIS — D0511 Intraductal carcinoma in situ of right breast: Secondary | ICD-10-CM

## 2024-05-13 NOTE — Progress Notes (Signed)
 [facility] Spiritual Care Note  Met with Jennifer Tyler in Breast Multidisciplinary Clinic to introduce Support Center team/resources.  She completed SDOH screening; results follow below.  SDOH Interventions    Flowsheet Row Care Coordination from 06/28/2022 in [employer] Care Coordination  SDOH Interventions   Food Insecurity Interventions Intervention Not Indicated  Transportation Interventions Intervention Not Indicated    SDOH Screenings   Food Insecurity: No Food Insecurity (05/13/2024)  Housing: Low Risk  (05/13/2024)  Transportation Needs: No Transportation Needs (05/13/2024)  Utilities: Not At Risk (05/13/2024)  Depression (PHQ2-9): Low Risk  (05/13/2024)  Tobacco Use: Low Risk  (05/12/2024)   [facility] and patient discussed common feelings and emotions when being diagnosed with cancer, and the importance of support during treatment.  Chaplain informed patient of the support team and support services at [facility].  Chaplain provided contact information and encouraged patient to call with any questions or concerns.  Jennifer Tyler reports good family support.  Follow up needed: No. Jennifer Tyler prefers to contact chaplain as needed/desired for follow-up support. Added her to programming information email distribution list per her request.   Jennifer Tyler, [facility] Pager [phone] Voicemail [phone]

## 2024-05-17 ENCOUNTER — Other Ambulatory Visit: Payer: Medicare Other

## 2024-05-17 ENCOUNTER — Ambulatory Visit (HOSPITAL_BASED_OUTPATIENT_CLINIC_OR_DEPARTMENT_OTHER)
Admission: RE | Admit: 2024-05-17 | Discharge: 2024-05-17 | Disposition: A | Discharge status: Home / Self Care | Source: Ambulatory Visit | Attending: Family Medicine | Admitting: Family Medicine

## 2024-05-17 DIAGNOSIS — M810 Age-related osteoporosis without current pathological fracture: Secondary | ICD-10-CM | POA: Diagnosis not present

## 2024-05-17 DIAGNOSIS — Z780 Asymptomatic menopausal state: Secondary | ICD-10-CM | POA: Diagnosis not present

## 2024-05-20 ENCOUNTER — Telehealth: Payer: Self-pay | Admitting: *Deleted

## 2024-05-20 ENCOUNTER — Encounter: Payer: Self-pay | Admitting: *Deleted

## 2024-05-20 NOTE — Telephone Encounter (Signed)
 Spoke with patient to follow up from [facility] 7/9 and assess navigation needs. Patient denies any questions at this time she is waiting for a surgery date. Informed her a message was sent to [facility] for an update. Encouraged her to call should anything arise.

## 2024-05-26 ENCOUNTER — Other Ambulatory Visit: Payer: Self-pay | Admitting: General Surgery

## 2024-05-26 ENCOUNTER — Encounter: Payer: Self-pay | Admitting: *Deleted

## 2024-05-26 DIAGNOSIS — D0511 Intraductal carcinoma in situ of right breast: Secondary | ICD-10-CM

## 2024-05-27 ENCOUNTER — Encounter (HOSPITAL_BASED_OUTPATIENT_CLINIC_OR_DEPARTMENT_OTHER): Payer: Self-pay | Admitting: General Surgery

## 2024-05-27 ENCOUNTER — Other Ambulatory Visit: Payer: Self-pay

## 2024-05-27 NOTE — Progress Notes (Signed)
   05/27/24 0925  PAT Phone Screen  Is the patient taking a GLP-1 receptor agonist? No  Do You Have Diabetes? No  Do You Have Hypertension? Yes  Have You Ever Been to the ER for Asthma? No  Have You Taken Oral Steroids in the Past 3 Months? No  Do you Take Phenteramine or any Other Diet Drugs? No  Recent  Lab Work, EKG, CXR? (S)  Yes (CMP)  Where was this test performed? cancer center  Do you have a history of heart problems? (S)  No (pt had a small heart murmor, PCP didnt recomend follow up)  Any Recent Hospitalizations? No  Height 4' 11 (1.499 m)  Weight 81.6 kg  Pat Appointment Scheduled (S)  Yes (EKG)

## 2024-05-28 ENCOUNTER — Other Ambulatory Visit: Payer: Self-pay | Admitting: General Surgery

## 2024-05-28 ENCOUNTER — Encounter (HOSPITAL_BASED_OUTPATIENT_CLINIC_OR_DEPARTMENT_OTHER)
Admission: RE | Admit: 2024-05-28 | Discharge: 2024-05-28 | Disposition: A | Discharge status: Home / Self Care | Source: Ambulatory Visit | Attending: General Surgery | Admitting: General Surgery

## 2024-05-28 ENCOUNTER — Ambulatory Visit
Admission: RE | Admit: 2024-05-28 | Discharge: 2024-05-28 | Disposition: A | Discharge status: Home / Self Care | Source: Ambulatory Visit | Attending: General Surgery | Admitting: General Surgery

## 2024-05-28 DIAGNOSIS — I10 Essential (primary) hypertension: Secondary | ICD-10-CM | POA: Insufficient documentation

## 2024-05-28 DIAGNOSIS — Z01810 Encounter for preprocedural cardiovascular examination: Secondary | ICD-10-CM | POA: Diagnosis not present

## 2024-05-28 DIAGNOSIS — D0511 Intraductal carcinoma in situ of right breast: Secondary | ICD-10-CM

## 2024-05-28 HISTORY — PX: BREAST BIOPSY: SHX20

## 2024-05-28 MED ORDER — CHLORHEXIDINE GLUCONATE CLOTH 2 % EX PADS: 6.0000 | MEDICATED_PAD | Freq: Once | CUTANEOUS | Status: DC

## 2024-05-28 MED ORDER — ENSURE PRE-SURGERY PO LIQD: 296.0000 mL | Freq: Once | ORAL | Status: DC

## 2024-05-28 NOTE — Progress Notes (Signed)

## 2024-05-31 DIAGNOSIS — D0511 Intraductal carcinoma in situ of right breast: Secondary | ICD-10-CM | POA: Diagnosis not present

## 2024-05-31 NOTE — H&P (Signed)
 72 yof with screening mm. She has b density tissue. She has fh in a sister. She has in LIQ a 1 cm area of calcs. Biopsy was done that shows int grade DCIS that is 95%er pos, 90% pr pos.   Review of Systems: A complete review of systems was obtained from the patient. I have reviewed this information and discussed as appropriate with the patient. See HPI as well for other ROS.  Review of Systems  Musculoskeletal: Positive for joint pain.  All other systems reviewed and are negative.   Medical History: Past Medical History:  Diagnosis Date  Arrhythmia  Arthritis  GERD (gastroesophageal reflux disease)  History of cancer  Hypertension   Past Surgical History:  Procedure Laterality Date  HYSTERECTOMY    Allergies  Allergen Reactions  Hydrocodone-Acetaminophen  Other (See Comments) and Rash   Current Outpatient Medications on File Prior to Visit  Medication Sig Dispense Refill  buPROPion  (WELLBUTRIN  SR) 200 MG SR tablet Take 200 mg by mouth 2 (two) times daily  calcium carb,glucon-vitamin D2 500 mg-5 mcg (200 unit) Tab Take 1 tablet by mouth once daily  cyanocobalamin (VITAMIN B12) 250 MCG tablet Take 250 mcg by mouth once daily  ergocalciferol , vitamin D2, 1,250 mcg (50,000 unit) capsule Take 50,000 Units by mouth every 7 (seven) days  hydroCHLOROthiazide (HYDRODIURIL) 25 MG tablet Take 25 mg by mouth once daily  lisinopriL (ZESTRIL) 10 MG tablet Take 10 mg by mouth once daily  magnesium gluconate (MAGONATE) 27.5 mg magne- sium (500 mg) tablet Take 500 mg by mouth 2 (two) times daily  multivitamin with minerals tablet Take 1 tablet by mouth once daily  naproxen sodium (ALEVE) 220 MG tablet Take 220 mg by mouth   Family History  Problem Relation Age of Onset  Obesity Mother  Breast cancer Sister  Obesity Sister    Social History   Tobacco Use  Smoking Status Never  Smokeless Tobacco Never  Marital status: Married  Tobacco Use  Smoking status: Never  Smokeless  tobacco: Never    Objective:   Physical Exam Vitals reviewed.  Constitutional:  Appearance: Normal appearance.  Chest:  Breasts: Right: No inverted nipple, mass or nipple discharge.  Left: No inverted nipple, mass or nipple discharge.  Lymphadenopathy:  Upper Body:  Right upper body: No supraclavicular or axillary adenopathy.  Left upper body: No supraclavicular or axillary adenopathy.  Neurological:  Mental Status: She is alert.    Assessment and Plan:   Right breast DCIS Right breast seed guided lumpectomy  We discussed the staging and pathophysiology of breast cancer. We discussed all of the different options for treatment for breast cancer including surgery,radiation therapy, and antiestrogen therapy.  She does not need sn biopsy due to DCIS  We discussed the options for treatment of the breast cancer which included lumpectomy versus a mastectomy. We discussed the performance of the lumpectomy with radioactive seed placement. We discussed a 5-10% chance of a positive margin requiring reexcision in the operating room. We also discussed that she will likely need radiation therapy if she undergoes lumpectomy. We discussed mastectomy and the postoperative care for that as well. Mastectomy can be followed by reconstruction. Most mastectomy patients will not need radiation therapy. We discussed that there is no difference in her survival whether she undergoes lumpectomy with radiation therapy or antiestrogen therapy versus a mastectomy. There is also no real difference between her recurrence in the breast.  We discussed the risks of operation including bleeding, infection, possible reoperation. She understands  her further therapy will be based on what her stages at the time of her operatio

## 2024-05-31 NOTE — Anesthesia Preprocedure Evaluation (Signed)
 Anesthesia Evaluation  Patient identified by MRN, date of birth, ID band Patient awake    Reviewed: Allergy & Precautions, NPO status , Patient's Chart, lab work & pertinent test results  History of Anesthesia Complications Negative for: history of anesthetic complications  Airway Mallampati: I  TM Distance: >3 FB Neck ROM: Full    Dental   Pulmonary neg pulmonary ROS   Pulmonary exam normal breath sounds clear to auscultation       Cardiovascular hypertension (HCTZ, losartan), Pt. on medications (-) angina (-) Past MI, (-) Cardiac Stents and (-) CABG (-) dysrhythmias + Valvular Problems/Murmurs  Rhythm:Regular Rate:Normal  HLD   Neuro/Psych  PSYCHIATRIC DISORDERS  Depression    negative neurological ROS     GI/Hepatic Neg liver ROS,GERD  Medicated,,  Endo/Other  negative endocrine ROS    Renal/GU negative Renal ROS     Musculoskeletal Osteoporosis    Abdominal   Peds  Hematology negative hematology ROS (+)   Anesthesia Other Findings   Reproductive/Obstetrics Right breast cancer                              Anesthesia Physical Anesthesia Plan  ASA: 2  Anesthesia Plan: General   Post-op Pain Management: Tylenol  PO (pre-op)*   Induction: Intravenous  PONV Risk Score and Plan: 3 and Ondansetron , Dexamethasone , Propofol  infusion, TIVA and Treatment may vary due to age or medical condition  Airway Management Planned: LMA  Additional Equipment:   Intra-op Plan:   Post-operative Plan: Extubation in OR  Informed Consent: I have reviewed the patients History and Physical, chart, labs and discussed the procedure including the risks, benefits and alternatives for the proposed anesthesia with the patient or authorized representative who has indicated his/her understanding and acceptance.     Dental advisory given  Plan Discussed with: CRNA and Anesthesiologist  Anesthesia Plan  Comments: (Risks of general anesthesia discussed including, but not limited to, sore throat, hoarse voice, chipped/damaged teeth, injury to vocal cords, nausea and vomiting, allergic reactions, lung infection, heart attack, stroke, and death. All questions answered. )         Anesthesia Quick Evaluation

## 2024-06-01 ENCOUNTER — Other Ambulatory Visit: Payer: Self-pay

## 2024-06-01 ENCOUNTER — Ambulatory Visit
Admission: RE | Admit: 2024-06-01 | Discharge: 2024-06-01 | Disposition: A | Discharge status: Home / Self Care | Source: Ambulatory Visit | Attending: General Surgery | Admitting: General Surgery

## 2024-06-01 ENCOUNTER — Ambulatory Visit (HOSPITAL_BASED_OUTPATIENT_CLINIC_OR_DEPARTMENT_OTHER)
Admission: RE | Admit: 2024-06-01 | Discharge: 2024-06-01 | Disposition: A | Discharge status: Home / Self Care | Attending: General Surgery | Admitting: General Surgery

## 2024-06-01 ENCOUNTER — Encounter (HOSPITAL_BASED_OUTPATIENT_CLINIC_OR_DEPARTMENT_OTHER): Admitting: Anesthesiology

## 2024-06-01 ENCOUNTER — Encounter (HOSPITAL_BASED_OUTPATIENT_CLINIC_OR_DEPARTMENT_OTHER): Payer: Self-pay | Admitting: General Surgery

## 2024-06-01 ENCOUNTER — Ambulatory Visit (HOSPITAL_BASED_OUTPATIENT_CLINIC_OR_DEPARTMENT_OTHER): Admitting: Anesthesiology

## 2024-06-01 ENCOUNTER — Encounter (HOSPITAL_BASED_OUTPATIENT_CLINIC_OR_DEPARTMENT_OTHER)
Admission: RE | Disposition: A | Discharge status: Home / Self Care | Payer: Self-pay | Source: Home / Self Care | Attending: General Surgery

## 2024-06-01 DIAGNOSIS — C50911 Malignant neoplasm of unspecified site of right female breast: Secondary | ICD-10-CM | POA: Diagnosis not present

## 2024-06-01 DIAGNOSIS — F32A Depression, unspecified: Secondary | ICD-10-CM | POA: Diagnosis not present

## 2024-06-01 DIAGNOSIS — D0511 Intraductal carcinoma in situ of right breast: Secondary | ICD-10-CM

## 2024-06-01 DIAGNOSIS — I10 Essential (primary) hypertension: Secondary | ICD-10-CM | POA: Insufficient documentation

## 2024-06-01 DIAGNOSIS — Z1721 Progesterone receptor positive status: Secondary | ICD-10-CM | POA: Insufficient documentation

## 2024-06-01 DIAGNOSIS — K219 Gastro-esophageal reflux disease without esophagitis: Secondary | ICD-10-CM | POA: Insufficient documentation

## 2024-06-01 DIAGNOSIS — Z170 Estrogen receptor positive status [ER+]: Secondary | ICD-10-CM | POA: Diagnosis not present

## 2024-06-01 DIAGNOSIS — Z803 Family history of malignant neoplasm of breast: Secondary | ICD-10-CM | POA: Diagnosis not present

## 2024-06-01 DIAGNOSIS — Z79899 Other long term (current) drug therapy: Secondary | ICD-10-CM | POA: Insufficient documentation

## 2024-06-01 HISTORY — DX: Pure hypercholesterolemia, unspecified: E78.00

## 2024-06-01 HISTORY — DX: Age-related osteoporosis without current pathological fracture: M81.0

## 2024-06-01 HISTORY — PX: BREAST LUMPECTOMY WITH RADIOACTIVE SEED LOCALIZATION: SHX6424

## 2024-06-01 HISTORY — DX: Diverticulosis of intestine, part unspecified, without perforation or abscess without bleeding: K57.90

## 2024-06-01 HISTORY — DX: Gastro-esophageal reflux disease without esophagitis: K21.9

## 2024-06-01 SURGERY — BREAST LUMPECTOMY WITH RADIOACTIVE SEED LOCALIZATION
Anesthesia: General | Site: Breast | Laterality: Right

## 2024-06-01 MED ORDER — FENTANYL CITRATE (PF) 100 MCG/2ML IJ SOLN
INTRAMUSCULAR | Status: DC | PRN
  Administered 2024-06-01 (×2): 50 ug via INTRAVENOUS

## 2024-06-01 MED ORDER — LIDOCAINE 2% (20 MG/ML) 5 ML SYRINGE
INTRAMUSCULAR | Status: DC | PRN
  Administered 2024-06-01: 20 mg via INTRAVENOUS
  Administered 2024-06-01: 80 mg via INTRAVENOUS

## 2024-06-01 MED ORDER — GLYCOPYRROLATE PF 0.2 MG/ML IJ SOSY
PREFILLED_SYRINGE | INTRAMUSCULAR | Status: DC | PRN
  Administered 2024-06-01: .2 mg via INTRAVENOUS

## 2024-06-01 MED ORDER — LACTATED RINGERS IV SOLN: INTRAVENOUS | Status: DC

## 2024-06-01 MED ORDER — PROPOFOL 10 MG/ML IV BOLUS
INTRAVENOUS | Status: DC | PRN
  Administered 2024-06-01 (×3): 50 mg via INTRAVENOUS
  Administered 2024-06-01: 150 mg via INTRAVENOUS

## 2024-06-01 MED ORDER — LIDOCAINE 2% (20 MG/ML) 5 ML SYRINGE: INTRAMUSCULAR | Status: DC | PRN

## 2024-06-01 MED ORDER — CEFAZOLIN SODIUM-DEXTROSE 2-4 GM/100ML-% IV SOLN
2.0000 g | INTRAVENOUS | Status: AC
  Administered 2024-06-01: 2 g via INTRAVENOUS

## 2024-06-01 MED ORDER — DEXAMETHASONE SODIUM PHOSPHATE 10 MG/ML IJ SOLN
INTRAMUSCULAR | Status: AC
  Filled 2024-06-01: qty 1

## 2024-06-01 MED ORDER — ACETAMINOPHEN 500 MG PO TABS
ORAL_TABLET | ORAL | Status: AC
  Filled 2024-06-01: qty 2

## 2024-06-01 MED ORDER — DEXAMETHASONE SODIUM PHOSPHATE 10 MG/ML IJ SOLN
INTRAMUSCULAR | Status: DC | PRN
  Administered 2024-06-01: 8 mg via INTRAVENOUS

## 2024-06-01 MED ORDER — FENTANYL CITRATE (PF) 100 MCG/2ML IJ SOLN
INTRAMUSCULAR | Status: AC
  Filled 2024-06-01: qty 2

## 2024-06-01 MED ORDER — OXYCODONE HCL 5 MG PO TABS
ORAL_TABLET | ORAL | Status: AC
  Filled 2024-06-01: qty 1

## 2024-06-01 MED ORDER — LIDOCAINE 2% (20 MG/ML) 5 ML SYRINGE
INTRAMUSCULAR | Status: AC
  Filled 2024-06-01: qty 5

## 2024-06-01 MED ORDER — PROPOFOL 500 MG/50ML IV EMUL
INTRAVENOUS | Status: AC
Start: 2024-06-01 — End: 2024-06-01
  Filled 2024-06-01: qty 50

## 2024-06-01 MED ORDER — PHENYLEPHRINE 80 MCG/ML (10ML) SYRINGE FOR IV PUSH (FOR BLOOD PRESSURE SUPPORT)
PREFILLED_SYRINGE | INTRAVENOUS | Status: AC
  Filled 2024-06-01: qty 10

## 2024-06-01 MED ORDER — ACETAMINOPHEN 500 MG PO TABS
1000.0000 mg | ORAL_TABLET | ORAL | Status: AC
  Administered 2024-06-01: 1000 mg via ORAL

## 2024-06-01 MED ORDER — LACTATED RINGERS IV SOLN: INTRAVENOUS | Status: DC | PRN

## 2024-06-01 MED ORDER — BUPIVACAINE HCL (PF) 0.25 % IJ SOLN
INTRAMUSCULAR | Status: DC | PRN
  Administered 2024-06-01: 10 mL

## 2024-06-01 MED ORDER — ONDANSETRON HCL 4 MG/2ML IJ SOLN
INTRAMUSCULAR | Status: DC | PRN
  Administered 2024-06-01: 4 mg via INTRAVENOUS

## 2024-06-01 MED ORDER — OXYCODONE HCL 5 MG PO TABS
5.0000 mg | ORAL_TABLET | Freq: Once | ORAL | Status: AC | PRN
  Administered 2024-06-01: 5 mg via ORAL

## 2024-06-01 MED ORDER — OXYCODONE HCL 5 MG/5ML PO SOLN: 5.0000 mg | Freq: Once | ORAL | Status: AC | PRN

## 2024-06-01 MED ORDER — GLYCOPYRROLATE PF 0.2 MG/ML IJ SOSY
PREFILLED_SYRINGE | INTRAMUSCULAR | Status: AC
  Filled 2024-06-01: qty 1

## 2024-06-01 MED ORDER — PROPOFOL 500 MG/50ML IV EMUL
INTRAVENOUS | Status: DC | PRN
  Administered 2024-06-01: 125 ug/kg/min via INTRAVENOUS

## 2024-06-01 MED ORDER — 0.9 % SODIUM CHLORIDE (POUR BTL) OPTIME
TOPICAL | Status: DC | PRN
  Administered 2024-06-01: 200 mL

## 2024-06-01 MED ORDER — KETOROLAC TROMETHAMINE 30 MG/ML IJ SOLN
INTRAMUSCULAR | Status: AC
  Filled 2024-06-01: qty 1

## 2024-06-01 MED ORDER — AMISULPRIDE (ANTIEMETIC) 5 MG/2ML IV SOLN: 10.0000 mg | Freq: Once | INTRAVENOUS | Status: DC | PRN

## 2024-06-01 MED ORDER — DEXMEDETOMIDINE HCL IN NACL 80 MCG/20ML IV SOLN
INTRAVENOUS | Status: DC | PRN
  Administered 2024-06-01: 6 ug via INTRAVENOUS
  Administered 2024-06-01: 4 ug via INTRAVENOUS

## 2024-06-01 MED ORDER — CEFAZOLIN SODIUM-DEXTROSE 2-4 GM/100ML-% IV SOLN
INTRAVENOUS | Status: AC
  Filled 2024-06-01: qty 100

## 2024-06-01 MED ORDER — PHENYLEPHRINE 80 MCG/ML (10ML) SYRINGE FOR IV PUSH (FOR BLOOD PRESSURE SUPPORT)
PREFILLED_SYRINGE | INTRAVENOUS | Status: DC | PRN
  Administered 2024-06-01: 160 ug via INTRAVENOUS
  Administered 2024-06-01 (×2): 80 ug via INTRAVENOUS
  Administered 2024-06-01: 100 ug via INTRAVENOUS
  Administered 2024-06-01: 80 ug via INTRAVENOUS
  Administered 2024-06-01: 160 ug via INTRAVENOUS
  Administered 2024-06-01: 100 ug via INTRAVENOUS

## 2024-06-01 MED ORDER — ONDANSETRON HCL 4 MG/2ML IJ SOLN
INTRAMUSCULAR | Status: AC
  Filled 2024-06-01: qty 2

## 2024-06-01 MED ORDER — FENTANYL CITRATE (PF) 100 MCG/2ML IJ SOLN: 25.0000 ug | INTRAMUSCULAR | Status: DC | PRN

## 2024-06-01 MED ORDER — PROPOFOL 500 MG/50ML IV EMUL
INTRAVENOUS | Status: AC
  Filled 2024-06-01: qty 100

## 2024-06-01 SURGICAL SUPPLY — 46 items
BINDER BREAST LRG (GAUZE/BANDAGES/DRESSINGS) IMPLANT
BINDER BREAST MEDIUM (GAUZE/BANDAGES/DRESSINGS) IMPLANT
BINDER BREAST XLRG (GAUZE/BANDAGES/DRESSINGS) IMPLANT
BINDER BREAST XXLRG (GAUZE/BANDAGES/DRESSINGS) IMPLANT
BLADE SURG 15 STRL LF DISP TIS (BLADE) ×1 IMPLANT
CANISTER SUC SOCK COL 7IN (MISCELLANEOUS) IMPLANT
CANISTER SUCT 1200ML W/VALVE (MISCELLANEOUS) IMPLANT
CHLORAPREP W/TINT 26 (MISCELLANEOUS) ×1 IMPLANT
CLIP APPLIE 9.375 MED OPEN (MISCELLANEOUS) IMPLANT
CLIP TI WIDE RED SMALL 6 (CLIP) IMPLANT
COVER BACK TABLE 60X90IN (DRAPES) ×1 IMPLANT
COVER MAYO STAND STRL (DRAPES) ×1 IMPLANT
COVER PROBE CYLINDRICAL 5X96 (MISCELLANEOUS) ×1 IMPLANT
DERMABOND ADVANCED .7 DNX12 (GAUZE/BANDAGES/DRESSINGS) ×1 IMPLANT
DRAPE LAPAROSCOPIC ABDOMINAL (DRAPES) ×1 IMPLANT
DRAPE UTILITY XL STRL (DRAPES) ×1 IMPLANT
DRSG TEGADERM 4X4.75 (GAUZE/BANDAGES/DRESSINGS) IMPLANT
ELECT COATED BLADE 2.86 ST (ELECTRODE) ×1 IMPLANT
ELECTRODE REM PT RTRN 9FT ADLT (ELECTROSURGICAL) ×1 IMPLANT
GAUZE SPONGE 4X4 12PLY STRL LF (GAUZE/BANDAGES/DRESSINGS) IMPLANT
GLOVE BIO SURGEON STRL SZ7 (GLOVE) ×2 IMPLANT
GLOVE BIOGEL PI IND STRL 7.5 (GLOVE) ×1 IMPLANT
GOWN STRL REUS W/ TWL LRG LVL3 (GOWN DISPOSABLE) ×2 IMPLANT
HEMOSTAT ARISTA ABSORB 3G PWDR (HEMOSTASIS) IMPLANT
KIT MARKER MARGIN INK (KITS) ×1 IMPLANT
NDL HYPO 25X1 1.5 SAFETY (NEEDLE) ×1 IMPLANT
NEEDLE HYPO 25X1 1.5 SAFETY (NEEDLE) ×1 IMPLANT
NS IRRIG 1000ML POUR BTL (IV SOLUTION) IMPLANT
PACK BASIN DAY SURGERY FS (CUSTOM PROCEDURE TRAY) ×1 IMPLANT
PENCIL SMOKE EVACUATOR (MISCELLANEOUS) ×1 IMPLANT
RETRACTOR ONETRAX LX 90X20 (MISCELLANEOUS) IMPLANT
SLEEVE SCD COMPRESS KNEE MED (STOCKING) ×1 IMPLANT
SPIKE FLUID TRANSFER (MISCELLANEOUS) IMPLANT
SPONGE T-LAP 4X18 ~~LOC~~+RFID (SPONGE) ×1 IMPLANT
STRIP CLOSURE SKIN 1/2X4 (GAUZE/BANDAGES/DRESSINGS) ×1 IMPLANT
SUT MNCRL AB 4-0 PS2 18 (SUTURE) ×1 IMPLANT
SUT MON AB 5-0 PS2 18 (SUTURE) IMPLANT
SUT SILK 2 0 SH (SUTURE) IMPLANT
SUT VIC AB 2-0 SH 27XBRD (SUTURE) ×1 IMPLANT
SUT VIC AB 3-0 SH 27X BRD (SUTURE) ×1 IMPLANT
SUT VIC AB 5-0 PS2 18 (SUTURE) IMPLANT
SYR CONTROL 10ML LL (SYRINGE) ×1 IMPLANT
TOWEL GREEN STERILE FF (TOWEL DISPOSABLE) ×1 IMPLANT
TRAY FAXITRON CT DISP (TRAY / TRAY PROCEDURE) ×1 IMPLANT
TUBE CONNECTING 20X1/4 (TUBING) IMPLANT
YANKAUER SUCT BULB TIP NO VENT (SUCTIONS) IMPLANT

## 2024-06-01 NOTE — Interval H&P Note (Signed)
 History and Physical Interval Note:  06/01/2024 10:50 AM  Jennifer Tyler  has presented today for surgery, with the diagnosis of RIGHT BREAST CANCER.  The various methods of treatment have been discussed with the patient and family. After consideration of risks, benefits and other options for treatment, the patient has consented to  Procedure(s) with comments: BREAST LUMPECTOMY WITH RADIOACTIVE SEED LOCALIZATION (Right) - RIGHT BREAST SEED GUIDED LUMPECTOMY as a surgical intervention.  The patient's history has been reviewed, patient examined, no change in status, stable for surgery.  I have reviewed the patient's chart and labs.  Questions were answered to the patient's satisfaction.     Donnice Bury

## 2024-06-01 NOTE — Discharge Instructions (Addendum)
 [facility] Office Phone Number [phone]  POST OP INSTRUCTIONS Take 400 mg of ibuprofen every 8 hours or 650 mg tylenol  every 6 hours for next 72 hours then as needed. Use ice several times daily also.  A prescription for pain medication may be given to you upon discharge.  Take your pain medication as prescribed, if needed.  If narcotic pain medicine is not needed, then you may take acetaminophen  (Tylenol ), naprosyn (Alleve) or ibuprofen (Advil) as needed. Take your usually prescribed medications unless otherwise directed If you need a refill on your pain medication, please contact your pharmacy.  They will contact our office to request authorization.  Prescriptions will not be filled after 5pm or on week-ends. You should eat very light the first 24 hours after surgery, such as soup, crackers, pudding, etc.  Resume your normal diet the day after surgery. Most patients will experience some swelling and bruising in the breast.  Ice packs and a good support bra will help.  Wear the breast binder provided or a sports bra for 72 hours day and night.  After that wear a sports bra during the day until you return to the office. Swelling and bruising can take several days to resolve.  It is common to experience some constipation if taking pain medication after surgery.  Increasing fluid intake and taking a stool softener will usually help or prevent this problem from occurring.  A mild laxative (Milk of Magnesia or Miralax) should be taken according to package directions if there are no bowel movements after 48 hours. I used skin glue on the incision, you may shower in 24 hours.  The glue will flake off over the next 2-3 weeks as will the steristrips.   Any sutures or staples will be removed at the office during your follow-up visit. ACTIVITIES:  You may resume regular daily activities (gradually increasing) beginning the next day.  Wearing a good support bra or sports bra minimizes pain and  swelling.  You may have sexual intercourse when it is comfortable. You may drive when you no longer are taking prescription pain medication, you can comfortably wear a seatbelt, and you can safely maneuver your car and apply brakes. RETURN TO WORK:  ______________________________________________________________________________________ Rosine should see your doctor in the office for a follow-up appointment approximately two to three weeks after your surgery.  Your doctor's nurse will typically make your follow-up appointment when she calls you with your pathology report.  Expect your pathology report 3-4 business days after your surgery.  You may call to check if you do not hear from us  after three days.  WHEN TO CALL DR WAKEFIELD: Fever over 101.0 Nausea and/or vomiting. Extreme swelling or bruising. Continued bleeding from incision. Increased pain, redness, or drainage from the incision.  The clinic staff is available to answer your questions during regular business hours.  Please don't hesitate to call and ask to speak to one of the nurses for clinical concerns.  If you have a medical emergency, go to the nearest emergency room or call 911.  A surgeon from [facility] is always on call at the hospital.  For further questions, please visit [email or web address]   *You had 5 mg of Oxycodone  at 12:50pm  Post Anesthesia Home Care Instructions  Activity: Get plenty of rest for the remainder of the day. A responsible individual must stay with you for 24 hours following the procedure.  For the next 24 hours, DO NOT: -Drive a car Environmental consultant -  Drink alcoholic beverages -Take any medication unless instructed by your physician -Make any legal decisions or sign important papers.  Meals: Start with liquid foods such as gelatin or soup. Progress to regular foods as tolerated. Avoid greasy, spicy, heavy foods. If nausea and/or vomiting occur, drink only clear liquids until  the nausea and/or vomiting subsides. Call your physician if vomiting continues.  Special Instructions/Symptoms: Your throat may feel dry or sore from the anesthesia or the breathing tube placed in your throat during surgery. If this causes discomfort, gargle with warm salt water. The discomfort should disappear within 24 hours.  If you had a scopolamine patch placed behind your ear for the management of post- operative nausea and/or vomiting:  1. The medication in the patch is effective for 72 hours, after which it should be removed.  Wrap patch in a tissue and discard in the trash. Wash hands thoroughly with soap and water. 2. You may remove the patch earlier than 72 hours if you experience unpleasant side effects which may include dry mouth, dizziness or visual disturbances. 3. Avoid touching the patch. Wash your hands with soap and water after contact with the patch.

## 2024-06-01 NOTE — Op Note (Signed)
  Preoperative diagnosis: Clinical stage 0 right breast cancer Postoperative diagnosis: Same as above Procedure: Right breast radioactive seed guided lumpectomy Surgeon: Dr. Adina Bury Anesthesia: General Estimated blood loss: Less than 50 cc Complications: None Drains: None Specimens: 1.  Right breast tissue marked with paint containing radioactive seed and clip 2.  Additional lateral, anterior and superior margins marked short superior, long lateral, double deep Sponge needle count was correct completion Disposition recovery stable condition   Indications: 72 yof with screening mm. She has b density tissue. She has fh in a sister. She has in LIQ a 1 cm area of calcs. Biopsy was done that shows int grade DCIS that is 95%er pos, 90% pr pos. We discussed a lumpectomy.    Procedure: She first had a radioactive seed placed at the breast center and had these images available for my review in the operating room.  She was then taken to the operating room.  She was given antibiotics.  SCDs were placed.  She was placed under general anesthesia without complication.  She was prepped and draped in standard sterile surgical fashion.  Surgical timeout was then performed.   The seed was in the LIQ I infiltrated Marcaine  throughout this area.  I then made a periareolar incision.  I tunneled to the seed and then I remove the seed in the surrounding tissue with an attempt to get a clear margin.  The seed and the clip were both present in the mammogram.  I then did a 3D CT scan I thought I might be close to 3 margins which I excised as well.  These were marked as above.  I did obtain hemostasis.  I then closed down the cavity with 2-0 Vicryl.  The skin was closed with 3-0 Vicryl and 5-0 Monocryl.  Glue and Steri-Strips were applied.  She tolerated this well was extubated and transferred recovery stable.

## 2024-06-01 NOTE — Anesthesia Postprocedure Evaluation (Signed)
 Anesthesia Post Note  Patient: Jennifer Tyler  Procedure(s) Performed: BREAST LUMPECTOMY WITH RADIOACTIVE SEED LOCALIZATION (Right: Breast)     Patient location during evaluation: PACU Anesthesia Type: General Level of consciousness: awake Pain management: pain level controlled Vital Signs Assessment: post-procedure vital signs reviewed and stable Respiratory status: spontaneous breathing, nonlabored ventilation and respiratory function stable Cardiovascular status: blood pressure returned to baseline and stable Postop Assessment: no apparent nausea or vomiting Anesthetic complications: no   No notable events documented.  Last Vitals:  Vitals:   06/01/24 1245 06/01/24 1249  BP: 126/79   Pulse: 77 77  Resp: (!) 21 17  Temp:    SpO2: 96% 97%    Last Pain:  Vitals:   06/01/24 1249  TempSrc:   PainSc: 4                  Delon Aisha Arch

## 2024-06-01 NOTE — Anesthesia Procedure Notes (Signed)
 Procedure Name: LMA Insertion Date/Time: 06/01/2024 11:21 AM  Performed by: Denton Niels CROME, CRNAPre-anesthesia Checklist: Patient identified, Emergency Drugs available, Suction available, Patient being monitored and Timeout performed Patient Re-evaluated:Patient Re-evaluated prior to induction Oxygen Delivery Method: Circle system utilized Preoxygenation: Pre-oxygenation with 100% oxygen Induction Type: IV induction Ventilation: Mask ventilation without difficulty LMA: LMA inserted and LMA with gastric port inserted LMA Size: 4.0 Number of attempts: 3 Placement Confirmation: positive ETCO2 Tube secured with: Tape Dental Injury: Teeth and Oropharynx as per pre-operative assessment  Comments: First attempt: Gastric curved LMA 4 Atraumatic insertion; difficult to seat & unable to provide adequate ventilation. Sa02, HR, BP Stable Second attempt: Reposition gastric curved LMA 4; continued to have leak; unable to seat LMA with reposition attempt. Sa02 96%, HR, BP Stable Third attempt: Replace gastric curbed LMA 4 with Classic LMA 4. Atraumatic insertion, adequate ventilation achieved. LMA secured. VSS

## 2024-06-01 NOTE — Transfer of Care (Signed)
 Immediate Anesthesia Transfer of Care Note  Patient: Jennifer Tyler  Procedure(s) Performed: BREAST LUMPECTOMY WITH RADIOACTIVE SEED LOCALIZATION (Right: Breast)  Patient Location: PACU  Anesthesia Type:General  Level of Consciousness: awake, alert , oriented, and patient cooperative  Airway & Oxygen Therapy: Patient Spontanous Breathing and Patient connected to face mask oxygen  Post-op Assessment: Report given to RN and Post -op Vital signs reviewed and stable  Post vital signs: Reviewed and stable  Last Vitals:  Vitals Value Taken Time  BP 112/71 06/01/24 12:25  Temp    Pulse 79 06/01/24 12:27  Resp 16 06/01/24 12:27  SpO2 100 % 06/01/24 12:27  Vitals shown include unfiled device data.  Last Pain:  Vitals:   06/01/24 0943  TempSrc: Temporal  PainSc: 0-No pain      Patients Stated Pain Goal: 4 (06/01/24 0943)  Complications: No notable events documented.

## 2024-06-02 ENCOUNTER — Encounter (HOSPITAL_BASED_OUTPATIENT_CLINIC_OR_DEPARTMENT_OTHER): Payer: Self-pay | Admitting: General Surgery

## 2024-06-02 DIAGNOSIS — C50911 Malignant neoplasm of unspecified site of right female breast: Secondary | ICD-10-CM | POA: Diagnosis not present

## 2024-06-04 ENCOUNTER — Encounter: Payer: Self-pay | Admitting: *Deleted

## 2024-06-04 DIAGNOSIS — D0511 Intraductal carcinoma in situ of right breast: Secondary | ICD-10-CM

## 2024-06-04 LAB — SURGICAL PATHOLOGY

## 2024-06-08 ENCOUNTER — Telehealth: Payer: Self-pay | Admitting: Radiation Oncology

## 2024-06-08 NOTE — Telephone Encounter (Signed)
 Left message for patient to call back to schedule consult per 8/1 referral.

## 2024-06-10 ENCOUNTER — Encounter: Payer: Self-pay | Admitting: *Deleted

## 2024-06-24 NOTE — Progress Notes (Signed)
 Location of Breast Cancer:  Newly diagnosed right breast DCIS   Histology per Pathology Report:    Receptor Status: ER( 95%), PR (90), Her2-neu (), Ki-67()  Did patient present with symptoms (if so, please note symptoms) or was this found on screening mammography?:   Past/Anticipated interventions by surgeon, if any:   Past/Anticipated interventions by medical oncology, if any:    Lymphedema issues, if any:  no   Pain issues, if any:  no   SAFETY ISSUES: Prior radiation? no Pacemaker/ICD? no Possible current pregnancy?no Is the patient on methotrexate? no  Current Complaints / other details:    BP (!) 151/83 (BP Location: Right Arm, Patient Position: Sitting, Cuff Size: Large)   Pulse 66   Temp 97.8 F (36.6 C)   Resp 20   Ht 4' 11 (1.499 m)   Wt 196 lb 12.8 oz (89.3 kg)   SpO2 98%   BMI 39.75 kg/m

## 2024-06-28 ENCOUNTER — Ambulatory Visit
Admission: RE | Admit: 2024-06-28 | Discharge: 2024-06-28 | Disposition: A | Discharge status: Home / Self Care | Source: Ambulatory Visit | Attending: Radiation Oncology | Admitting: Radiation Oncology

## 2024-06-28 ENCOUNTER — Encounter: Payer: Self-pay | Admitting: Radiation Oncology

## 2024-06-28 VITALS — BP 151/83 | HR 66 | Temp 97.8°F | Resp 20 | Ht 59.0 in | Wt 196.8 lb

## 2024-06-28 DIAGNOSIS — Z170 Estrogen receptor positive status [ER+]: Secondary | ICD-10-CM | POA: Diagnosis not present

## 2024-06-28 DIAGNOSIS — D0511 Intraductal carcinoma in situ of right breast: Secondary | ICD-10-CM | POA: Insufficient documentation

## 2024-06-28 DIAGNOSIS — Z79899 Other long term (current) drug therapy: Secondary | ICD-10-CM | POA: Insufficient documentation

## 2024-06-28 DIAGNOSIS — Z1721 Progesterone receptor positive status: Secondary | ICD-10-CM | POA: Insufficient documentation

## 2024-06-28 NOTE — Progress Notes (Addendum)
 Radiation Oncology         [phone] ________________________________  Name: Jennifer Tyler MRN: [id number]  Date: 06/28/2024  DOB: Feb 24, 1951  Re-Evaluation Note  CC: Okey [name] Redbird, MD  Lanny Callander, MD    ICD-10-CM   1. Ductal carcinoma in situ (DCIS) of right breast  D05.11       Diagnosis:   Stage 0 (cTis (DCIS), cN0, cM0) Right Breast, Intermediate grade DCIS, ER+ / PR+ / Her2 not assessed: s/p right breast lumpectomy   Narrative:  The patient returns today to discuss radiation treatment options. She was seen in the multidisciplinary breast clinic on 05/12/24.   Since her consultation date, she opted to proceed with a right breast lumpectomy without nodal biopsies on 06/01/24 under the care of Dr. Ebbie. Pathology from the procedure revealed: intermediate grade DCIS measuring 10 mm in the greatest extent; all margins negative for DCIS; margin status to in situ disease of 10 mm from the anterior margin; no lymph node were examined. Prognostic indicators significant for: estrogen receptor 95% positive and progesterone receptor 90% positive, both with strong staining intensity; Her2 not assessed.   Based on her discussion with Dr. Lanny on her breast clinic consultation date, she will proceed with antiestrogen therapy consisting of low-dose tamoxifen in the near future.    Of note: Imaging performed since her consultation date includes a bone mineral density scan on 05/17/24 that showed findings consistent with osteoporosis.   On review of systems, the patient reports to be doing well overall. She denies any breast pain, swelling, or issues with range of motion. She continues to wear her compression bra.    Allergies:  is allergic to lisinopril and hydrocodone-acetaminophen .  Meds: Current Outpatient Medications  Medication Sig Dispense Refill   buPROPion  (WELLBUTRIN  SR) 200 MG 12 hr tablet Take 1 tablet (200 mg total) by mouth 2 (two) times daily. 180 tablet 1    hydrochlorothiazide (HYDRODIURIL) 25 MG tablet Take 25 mg by mouth daily.     losartan (COZAAR) 25 MG tablet Take 25 mg by mouth daily.     naproxen sodium (ALEVE) 220 MG tablet Take 220 mg by mouth.     pantoprazole (PROTONIX) 40 MG tablet Take 40 mg by mouth daily.     raloxifene (EVISTA) 60 MG tablet Take 60 mg by mouth daily.     No current facility-administered medications for this encounter.    Physical Findings: The patient is in no acute distress. Patient is alert and oriented.  height is 4' 11 (1.499 m) and weight is 196 lb 12.8 oz (89.3 kg). Her temperature is 97.8 F (36.6 C). Her blood pressure is 151/83 (abnormal) and her pulse is 66. Her respiration is 20 and oxygen saturation is 98%.  No significant changes. Lungs are clear to auscultation bilaterally. Heart has regular rate and rhythm. No palpable cervical, supraclavicular, or axillary adenopathy. Abdomen soft, non-tender, normal bowel sounds.  Left Breast: no palpable mass, nipple discharge or bleeding. Right Breast: lumpectomy incision with well-approximated skin edges covered with surgical tape. Indurated tissue palpated beneath.   Lab Findings: Lab Results  Component Value Date   WBC 5.0 05/12/2024   HGB 13.3 05/12/2024   HCT 41.7 05/12/2024   MCV 88.9 05/12/2024   PLT 256 05/12/2024    Radiographic Findings: MM Breast Surgical Specimen Result Date: 06/02/2024 CLINICAL DATA:  73 year old with biopsy-proven DCIS involving the LOWER INNER QUADRANT of the RIGHT breast. Radioactive seed localization was performed on 05/28/2024 in anticipation  of today's lumpectomy. EXAM: SPECIMEN RADIOGRAPH OF THE RIGHT BREAST COMPARISON:  Previous exam(s). FINDINGS: Status post excision of the RIGHT breast. Images are submitted for interpretation post-operatively. The radioactive seed and the ribbon shaped tissue marking clip are present in the non-compressed specimen. The seed is intact. IMPRESSION: Specimen radiograph of the RIGHT  breast. Electronically Signed   By: Debby Satterfield M.D.   On: 06/02/2024 07:55    Impression:  Stage 0 (cTis (DCIS), cN0, cM0) Right Breast, Intermediate grade DCIS, ER+ / PR+ / Her2 not assessed: s/p right breast lumpectomy   Patient continues to heal from her lumpectomy. We personally reviewed her DCISion RT which predicts a 25% risk of recurrence within 10 years with surgery alone. Dr. Shannon recommends radiation to reduce the risk of locoregional disease recurrence. Today we discussed the natural course of early stage breast disease. We highlighted the role of radiotherapy in the management and focused on the details of logistics and delivery. We reviewed the anticipated acute and late sequelae associated with radiation in this setting. The patient was encouraged to ask questions that I answered to the best of my ability. Patient expressed readiness to proceed with treatment. A consent form was reviewed and signed today.    Plan:  Patient is scheduled for CT simulation later today. Will plan to begin radiation approximately one week after simulation. Dr. Shannon anticipates 42.72 Gy in 16 fractions to the right breast. The wide surgical margins (>1 cm) no boost is indicated.  -----------------------------------   Leeroy Due, PA-C   This document serves as a record of services personally performed by Lynwood Shannon, MD. It was created on his behalf by Dorthy Fuse, a trained medical scribe. The creation of this record is based on the scribe's personal observations and the provider's statements to them. This document has been checked and approved by the attending provider.

## 2024-06-29 NOTE — Addendum Note (Signed)
 Encounter addended by: Shannon Agent, MD on: 06/29/2024 10:28 AM  Actions taken: Clinical Note Signed

## 2024-07-05 ENCOUNTER — Ambulatory Visit
Admission: RE | Admit: 2024-07-05 | Discharge: 2024-07-05 | Disposition: A | Discharge status: Home / Self Care | Source: Ambulatory Visit | Attending: Radiation Oncology | Admitting: Radiation Oncology

## 2024-07-05 DIAGNOSIS — D0511 Intraductal carcinoma in situ of right breast: Secondary | ICD-10-CM | POA: Insufficient documentation

## 2024-07-06 ENCOUNTER — Encounter: Payer: Self-pay | Admitting: *Deleted

## 2024-07-06 DIAGNOSIS — D0511 Intraductal carcinoma in situ of right breast: Secondary | ICD-10-CM

## 2024-07-06 DIAGNOSIS — Z170 Estrogen receptor positive status [ER+]: Secondary | ICD-10-CM | POA: Diagnosis not present

## 2024-07-08 ENCOUNTER — Other Ambulatory Visit: Payer: Self-pay

## 2024-07-08 DIAGNOSIS — Z170 Estrogen receptor positive status [ER+]: Secondary | ICD-10-CM | POA: Diagnosis not present

## 2024-07-08 DIAGNOSIS — Z510 Encounter for antineoplastic radiation therapy: Secondary | ICD-10-CM | POA: Diagnosis not present

## 2024-07-08 DIAGNOSIS — D0511 Intraductal carcinoma in situ of right breast: Secondary | ICD-10-CM | POA: Diagnosis not present

## 2024-07-08 LAB — RAD ONC ARIA SESSION SUMMARY
Course Elapsed Days: 0
Plan Fractions Treated to Date: 1
Plan Prescribed Dose Per Fraction: 2.67 Gy
Plan Total Fractions Prescribed: 16
Plan Total Prescribed Dose: 42.72 Gy
Reference Point Dosage Given to Date: 2.67 Gy
Reference Point Session Dosage Given: 2.67 Gy
Session Number: 1

## 2024-07-09 ENCOUNTER — Other Ambulatory Visit: Payer: Self-pay

## 2024-07-09 ENCOUNTER — Ambulatory Visit
Admission: RE | Admit: 2024-07-09 | Discharge: 2024-07-09 | Disposition: A | Discharge status: Home / Self Care | Source: Ambulatory Visit | Attending: Radiation Oncology | Admitting: Radiation Oncology

## 2024-07-09 DIAGNOSIS — Z170 Estrogen receptor positive status [ER+]: Secondary | ICD-10-CM | POA: Diagnosis not present

## 2024-07-09 DIAGNOSIS — D0511 Intraductal carcinoma in situ of right breast: Secondary | ICD-10-CM | POA: Diagnosis not present

## 2024-07-09 DIAGNOSIS — Z510 Encounter for antineoplastic radiation therapy: Secondary | ICD-10-CM | POA: Diagnosis not present

## 2024-07-09 LAB — RAD ONC ARIA SESSION SUMMARY
Course Elapsed Days: 1
Plan Fractions Treated to Date: 2
Plan Prescribed Dose Per Fraction: 2.67 Gy
Plan Total Fractions Prescribed: 16
Plan Total Prescribed Dose: 42.72 Gy
Reference Point Dosage Given to Date: 5.34 Gy
Reference Point Session Dosage Given: 2.67 Gy
Session Number: 2

## 2024-07-12 ENCOUNTER — Other Ambulatory Visit: Payer: Self-pay

## 2024-07-12 ENCOUNTER — Ambulatory Visit
Admission: RE | Admit: 2024-07-12 | Discharge: 2024-07-12 | Disposition: A | Discharge status: Home / Self Care | Source: Ambulatory Visit | Attending: Radiation Oncology | Admitting: Radiation Oncology

## 2024-07-12 DIAGNOSIS — Z170 Estrogen receptor positive status [ER+]: Secondary | ICD-10-CM | POA: Diagnosis not present

## 2024-07-12 DIAGNOSIS — Z510 Encounter for antineoplastic radiation therapy: Secondary | ICD-10-CM | POA: Diagnosis not present

## 2024-07-12 DIAGNOSIS — D0511 Intraductal carcinoma in situ of right breast: Secondary | ICD-10-CM | POA: Diagnosis not present

## 2024-07-12 LAB — RAD ONC ARIA SESSION SUMMARY
Course Elapsed Days: 4
Plan Fractions Treated to Date: 3
Plan Prescribed Dose Per Fraction: 2.67 Gy
Plan Total Fractions Prescribed: 16
Plan Total Prescribed Dose: 42.72 Gy
Reference Point Dosage Given to Date: 8.01 Gy
Reference Point Session Dosage Given: 2.67 Gy
Session Number: 3

## 2024-07-13 ENCOUNTER — Other Ambulatory Visit: Payer: Self-pay

## 2024-07-13 ENCOUNTER — Ambulatory Visit
Admission: RE | Admit: 2024-07-13 | Discharge: 2024-07-13 | Disposition: A | Discharge status: Home / Self Care | Source: Ambulatory Visit | Attending: Radiation Oncology | Admitting: Radiation Oncology

## 2024-07-13 DIAGNOSIS — Z510 Encounter for antineoplastic radiation therapy: Secondary | ICD-10-CM | POA: Diagnosis not present

## 2024-07-13 DIAGNOSIS — D0511 Intraductal carcinoma in situ of right breast: Secondary | ICD-10-CM | POA: Diagnosis not present

## 2024-07-13 DIAGNOSIS — Z170 Estrogen receptor positive status [ER+]: Secondary | ICD-10-CM | POA: Diagnosis not present

## 2024-07-13 LAB — RAD ONC ARIA SESSION SUMMARY
Course Elapsed Days: 5
Plan Fractions Treated to Date: 4
Plan Prescribed Dose Per Fraction: 2.67 Gy
Plan Total Fractions Prescribed: 16
Plan Total Prescribed Dose: 42.72 Gy
Reference Point Dosage Given to Date: 10.68 Gy
Reference Point Session Dosage Given: 2.67 Gy
Session Number: 4

## 2024-07-13 MED ORDER — RADIAPLEXRX EX GEL: Freq: Once | CUTANEOUS | Status: AC

## 2024-07-13 MED ORDER — ALRA NON-METALLIC DEODORANT (RAD-ONC)
1.0000 | Freq: Once | TOPICAL | Status: AC
  Administered 2024-07-13: 1 via TOPICAL

## 2024-07-14 ENCOUNTER — Ambulatory Visit
Admission: RE | Admit: 2024-07-14 | Discharge: 2024-07-14 | Disposition: A | Discharge status: Home / Self Care | Source: Ambulatory Visit | Attending: Radiation Oncology | Admitting: Radiation Oncology

## 2024-07-14 ENCOUNTER — Other Ambulatory Visit: Payer: Self-pay

## 2024-07-14 DIAGNOSIS — D0511 Intraductal carcinoma in situ of right breast: Secondary | ICD-10-CM | POA: Diagnosis not present

## 2024-07-14 DIAGNOSIS — Z170 Estrogen receptor positive status [ER+]: Secondary | ICD-10-CM | POA: Diagnosis not present

## 2024-07-14 DIAGNOSIS — Z510 Encounter for antineoplastic radiation therapy: Secondary | ICD-10-CM | POA: Diagnosis not present

## 2024-07-14 LAB — RAD ONC ARIA SESSION SUMMARY
Course Elapsed Days: 6
Plan Fractions Treated to Date: 5
Plan Prescribed Dose Per Fraction: 2.67 Gy
Plan Total Fractions Prescribed: 16
Plan Total Prescribed Dose: 42.72 Gy
Reference Point Dosage Given to Date: 13.35 Gy
Reference Point Session Dosage Given: 2.67 Gy
Session Number: 5

## 2024-07-15 ENCOUNTER — Other Ambulatory Visit: Payer: Self-pay

## 2024-07-15 ENCOUNTER — Ambulatory Visit
Admission: RE | Admit: 2024-07-15 | Discharge: 2024-07-15 | Disposition: A | Discharge status: Home / Self Care | Source: Ambulatory Visit | Attending: Radiation Oncology | Admitting: Radiation Oncology

## 2024-07-15 DIAGNOSIS — D0511 Intraductal carcinoma in situ of right breast: Secondary | ICD-10-CM | POA: Diagnosis not present

## 2024-07-15 DIAGNOSIS — Z170 Estrogen receptor positive status [ER+]: Secondary | ICD-10-CM | POA: Diagnosis not present

## 2024-07-15 DIAGNOSIS — Z510 Encounter for antineoplastic radiation therapy: Secondary | ICD-10-CM | POA: Diagnosis not present

## 2024-07-15 LAB — RAD ONC ARIA SESSION SUMMARY
Course Elapsed Days: 7
Plan Fractions Treated to Date: 6
Plan Prescribed Dose Per Fraction: 2.67 Gy
Plan Total Fractions Prescribed: 16
Plan Total Prescribed Dose: 42.72 Gy
Reference Point Dosage Given to Date: 16.02 Gy
Reference Point Session Dosage Given: 2.67 Gy
Session Number: 6

## 2024-07-16 ENCOUNTER — Other Ambulatory Visit: Payer: Self-pay

## 2024-07-16 ENCOUNTER — Ambulatory Visit
Admission: RE | Admit: 2024-07-16 | Discharge: 2024-07-16 | Disposition: A | Discharge status: Home / Self Care | Source: Ambulatory Visit | Attending: Radiation Oncology | Admitting: Radiation Oncology

## 2024-07-16 DIAGNOSIS — D0511 Intraductal carcinoma in situ of right breast: Secondary | ICD-10-CM | POA: Diagnosis not present

## 2024-07-16 DIAGNOSIS — Z510 Encounter for antineoplastic radiation therapy: Secondary | ICD-10-CM | POA: Diagnosis not present

## 2024-07-16 DIAGNOSIS — Z170 Estrogen receptor positive status [ER+]: Secondary | ICD-10-CM | POA: Diagnosis not present

## 2024-07-16 LAB — RAD ONC ARIA SESSION SUMMARY
Course Elapsed Days: 8
Plan Fractions Treated to Date: 7
Plan Prescribed Dose Per Fraction: 2.67 Gy
Plan Total Fractions Prescribed: 16
Plan Total Prescribed Dose: 42.72 Gy
Reference Point Dosage Given to Date: 18.69 Gy
Reference Point Session Dosage Given: 2.67 Gy
Session Number: 7

## 2024-07-19 ENCOUNTER — Ambulatory Visit
Admission: RE | Admit: 2024-07-19 | Discharge: 2024-07-19 | Disposition: A | Discharge status: Home / Self Care | Source: Ambulatory Visit | Attending: Radiation Oncology | Admitting: Radiation Oncology

## 2024-07-19 ENCOUNTER — Other Ambulatory Visit: Payer: Self-pay

## 2024-07-19 DIAGNOSIS — D0511 Intraductal carcinoma in situ of right breast: Secondary | ICD-10-CM | POA: Diagnosis not present

## 2024-07-19 DIAGNOSIS — Z510 Encounter for antineoplastic radiation therapy: Secondary | ICD-10-CM | POA: Diagnosis not present

## 2024-07-19 DIAGNOSIS — Z170 Estrogen receptor positive status [ER+]: Secondary | ICD-10-CM | POA: Diagnosis not present

## 2024-07-19 LAB — RAD ONC ARIA SESSION SUMMARY
Course Elapsed Days: 11
Plan Fractions Treated to Date: 8
Plan Prescribed Dose Per Fraction: 2.67 Gy
Plan Total Fractions Prescribed: 16
Plan Total Prescribed Dose: 42.72 Gy
Reference Point Dosage Given to Date: 21.36 Gy
Reference Point Session Dosage Given: 2.67 Gy
Session Number: 8

## 2024-07-20 ENCOUNTER — Other Ambulatory Visit: Payer: Self-pay

## 2024-07-20 ENCOUNTER — Ambulatory Visit
Admission: RE | Admit: 2024-07-20 | Discharge: 2024-07-20 | Disposition: A | Discharge status: Home / Self Care | Source: Ambulatory Visit | Attending: Radiation Oncology | Admitting: Radiation Oncology

## 2024-07-20 DIAGNOSIS — Z170 Estrogen receptor positive status [ER+]: Secondary | ICD-10-CM | POA: Diagnosis not present

## 2024-07-20 DIAGNOSIS — D0511 Intraductal carcinoma in situ of right breast: Secondary | ICD-10-CM | POA: Diagnosis not present

## 2024-07-20 DIAGNOSIS — Z510 Encounter for antineoplastic radiation therapy: Secondary | ICD-10-CM | POA: Diagnosis not present

## 2024-07-20 LAB — RAD ONC ARIA SESSION SUMMARY
Course Elapsed Days: 12
Plan Fractions Treated to Date: 9
Plan Prescribed Dose Per Fraction: 2.67 Gy
Plan Total Fractions Prescribed: 16
Plan Total Prescribed Dose: 42.72 Gy
Reference Point Dosage Given to Date: 24.03 Gy
Reference Point Session Dosage Given: 2.67 Gy
Session Number: 9

## 2024-07-21 ENCOUNTER — Other Ambulatory Visit: Payer: Self-pay

## 2024-07-21 ENCOUNTER — Ambulatory Visit
Admission: RE | Admit: 2024-07-21 | Discharge: 2024-07-21 | Disposition: A | Discharge status: Home / Self Care | Source: Ambulatory Visit | Attending: Radiation Oncology | Admitting: Radiation Oncology

## 2024-07-21 DIAGNOSIS — D0511 Intraductal carcinoma in situ of right breast: Secondary | ICD-10-CM | POA: Diagnosis not present

## 2024-07-21 DIAGNOSIS — Z510 Encounter for antineoplastic radiation therapy: Secondary | ICD-10-CM | POA: Diagnosis not present

## 2024-07-21 DIAGNOSIS — Z170 Estrogen receptor positive status [ER+]: Secondary | ICD-10-CM | POA: Diagnosis not present

## 2024-07-21 LAB — RAD ONC ARIA SESSION SUMMARY
Course Elapsed Days: 13
Plan Fractions Treated to Date: 10
Plan Prescribed Dose Per Fraction: 2.67 Gy
Plan Total Fractions Prescribed: 16
Plan Total Prescribed Dose: 42.72 Gy
Reference Point Dosage Given to Date: 26.7 Gy
Reference Point Session Dosage Given: 2.67 Gy
Session Number: 10

## 2024-07-22 ENCOUNTER — Other Ambulatory Visit: Payer: Self-pay

## 2024-07-22 ENCOUNTER — Ambulatory Visit
Admission: RE | Admit: 2024-07-22 | Discharge: 2024-07-22 | Disposition: A | Discharge status: Home / Self Care | Source: Ambulatory Visit | Attending: Radiation Oncology | Admitting: Radiation Oncology

## 2024-07-22 DIAGNOSIS — Z170 Estrogen receptor positive status [ER+]: Secondary | ICD-10-CM | POA: Diagnosis not present

## 2024-07-22 DIAGNOSIS — Z510 Encounter for antineoplastic radiation therapy: Secondary | ICD-10-CM | POA: Diagnosis not present

## 2024-07-22 DIAGNOSIS — D0511 Intraductal carcinoma in situ of right breast: Secondary | ICD-10-CM | POA: Diagnosis not present

## 2024-07-22 LAB — RAD ONC ARIA SESSION SUMMARY
Course Elapsed Days: 14
Plan Fractions Treated to Date: 11
Plan Prescribed Dose Per Fraction: 2.67 Gy
Plan Total Fractions Prescribed: 16
Plan Total Prescribed Dose: 42.72 Gy
Reference Point Dosage Given to Date: 29.37 Gy
Reference Point Session Dosage Given: 2.67 Gy
Session Number: 11

## 2024-07-23 ENCOUNTER — Inpatient Hospital Stay: Attending: Hematology | Admitting: Nurse Practitioner

## 2024-07-23 ENCOUNTER — Ambulatory Visit
Admission: RE | Admit: 2024-07-23 | Discharge: 2024-07-23 | Disposition: A | Discharge status: Home / Self Care | Source: Ambulatory Visit | Attending: Radiation Oncology | Admitting: Radiation Oncology

## 2024-07-23 ENCOUNTER — Other Ambulatory Visit: Payer: Self-pay

## 2024-07-23 ENCOUNTER — Encounter: Payer: Self-pay | Admitting: Nurse Practitioner

## 2024-07-23 VITALS — BP 120/62 | HR 72 | Temp 98.0°F | Resp 17 | Ht 59.0 in | Wt 197.5 lb

## 2024-07-23 DIAGNOSIS — D0511 Intraductal carcinoma in situ of right breast: Secondary | ICD-10-CM | POA: Diagnosis not present

## 2024-07-23 DIAGNOSIS — Z1721 Progesterone receptor positive status: Secondary | ICD-10-CM | POA: Insufficient documentation

## 2024-07-23 DIAGNOSIS — Z510 Encounter for antineoplastic radiation therapy: Secondary | ICD-10-CM | POA: Diagnosis not present

## 2024-07-23 DIAGNOSIS — Z923 Personal history of irradiation: Secondary | ICD-10-CM | POA: Insufficient documentation

## 2024-07-23 DIAGNOSIS — M810 Age-related osteoporosis without current pathological fracture: Secondary | ICD-10-CM | POA: Insufficient documentation

## 2024-07-23 DIAGNOSIS — Z170 Estrogen receptor positive status [ER+]: Secondary | ICD-10-CM | POA: Diagnosis not present

## 2024-07-23 LAB — RAD ONC ARIA SESSION SUMMARY
Course Elapsed Days: 15
Plan Fractions Treated to Date: 12
Plan Prescribed Dose Per Fraction: 2.67 Gy
Plan Total Fractions Prescribed: 16
Plan Total Prescribed Dose: 42.72 Gy
Reference Point Dosage Given to Date: 32.04 Gy
Reference Point Session Dosage Given: 2.67 Gy
Session Number: 12

## 2024-07-23 NOTE — Progress Notes (Signed)
 [facility]   [phone] [phone]    Patient Care Team: Okey [name] Redbird, MD as PCP - General (Family Medicine) Tyree Nanetta SAILOR, RN as Oncology Nurse Navigator Gerome, Devere HERO, RN as Oncology Nurse Navigator Ebbie Cough, MD as Consulting Physician (General Surgery) Lanny Callander, MD as Consulting Physician (Hematology) Shannon Agent, MD as Consulting Physician (Radiation Oncology)   CHIEF COMPLAINT: Follow up right breast DCIS   CURRENT THERAPY: Adjuvant radiation   INTERVAL HISTORY Ms. Jennifer Tyler returns for follow up. Last seen by Dr. Lanny 05/12/24.  She underwent right lumpectomy by Dr. Ebbie 06/01/2024, path showed DCIS, negative margins.  Saw Dr. Shannon 06/28/2024 and began adjuvant RT 9/1. She has recovered well from surgery and is tolerating radiation well. Skin is a little dark. Other wise feels well in good health, active. Taking raloxifene for osteoporosis. Not taking calcium/D. Denies h/o thrombosis, stroke/MI, gyn malignancy, other other new/specific complaints.   ROS  All other systems reviewed and negative   Past Medical History:  Diagnosis Date   Acid reflux    Back pain    Diverticulosis    Gallbladder problem    High cholesterol    Hypertension    Osteoporosis    Swelling      Past Surgical History:  Procedure Laterality Date   ABDOMINAL HYSTERECTOMY     BREAST BIOPSY     BREAST BIOPSY Right 05/03/2024   MM RT BREAST BX W LOC DEV 1ST LESION IMAGE BX SPEC STEREO GUIDE 05/03/2024 [facility]   BREAST BIOPSY  05/28/2024   MM RT RADIOACTIVE SEED LOC MAMMO GUIDE 05/28/2024 [facility]   BREAST LUMPECTOMY WITH RADIOACTIVE SEED LOCALIZATION Right 06/01/2024   Procedure: BREAST LUMPECTOMY WITH RADIOACTIVE SEED LOCALIZATION;  Surgeon: Ebbie Cough, MD;  Location: [facility];  Service: General;  Laterality: Right;  RIGHT BREAST SEED GUIDED LUMPECTOMY   BUNIONECTOMY  2016   R    CHOLECYSTECTOMY       Outpatient Encounter Medications as of 07/23/2024  Medication Sig   buPROPion  (WELLBUTRIN  SR) 200 MG 12 hr tablet Take 1 tablet (200 mg total) by mouth 2 (two) times daily.   hydrochlorothiazide (HYDRODIURIL) 25 MG tablet Take 25 mg by mouth daily.   losartan (COZAAR) 25 MG tablet Take 25 mg by mouth daily.   naproxen sodium (ALEVE) 220 MG tablet Take 220 mg by mouth.   pantoprazole (PROTONIX) 40 MG tablet Take 40 mg by mouth daily.   raloxifene (EVISTA) 60 MG tablet Take 60 mg by mouth daily.   No facility-administered encounter medications on file as of 07/23/2024.     Today's Vitals   07/23/24 0958  BP: 120/62  Pulse: 72  Resp: 17  Temp: 98 F (36.7 C)  TempSrc: Temporal  SpO2: 99%  Weight: 197 lb 8 oz (89.6 kg)  Height: 4' 11 (1.499 m)   Body mass index is 39.89 kg/m.   ECOG PERFORMANCE STATUS: 0 - Asymptomatic  PHYSICAL EXAM GENERAL:alert, no distress and comfortable SKIN: no rash  EYES: sclera clear LUNGS: clear with normal breathing effort HEART: regular rate & rhythm NEURO: alert & oriented x 3 with fluent speech, no focal motor deficits Breast exam: visual inspection reveals asymmetric size, mild R breast hyperpigmentation    CBC    Latest Ref Rng & Units 05/12/2024   12:02 PM 11/11/2019    4:47 PM 12/24/2018    2:56 PM  CBC  WBC 4.0 - 10.5 K/uL  5.0  4.9  4.6   Hemoglobin 12.0 - 15.0 g/dL 86.6  84.7  85.7   Hematocrit 36.0 - 46.0 % 41.7  44.7  42.5   Platelets 150 - 400 K/uL 256  278  271       CMP     Latest Ref Rng & Units 05/12/2024   12:02 PM 09/20/2021   11:13 AM 05/16/2021   11:05 AM  CMP  Glucose 70 - 99 mg/dL 81  86  85   BUN 8 - 23 mg/dL 13  11  8    Creatinine 0.44 - 1.00 mg/dL 9.31  9.42  9.38   Sodium 135 - 145 mmol/L 140  134  141   Potassium 3.5 - 5.1 mmol/L 3.6  4.2  4.1   Chloride 98 - 111 mmol/L 104  94  102   CO2 22 - 32 mmol/L 31  28  27    Calcium 8.9 - 10.3 mg/dL 9.5  9.4  9.5   Total Protein 6.5 - 8.1  g/dL 6.9  6.6  6.3   Total Bilirubin 0.0 - 1.2 mg/dL 0.5  0.4  0.4   Alkaline Phos 38 - 126 U/L 71  86  80   AST 15 - 41 U/L 17  19  19    ALT 0 - 44 U/L 14  17  15        ASSESSMENT & PLAN: 73 year old female   Ductal carcinoma in situ (DCIS) of right breast, ER+/PR+ -Identified on routine mammogram.  - S/p right lumpectomy 06/01/2024 Dr. Ebbie, path showed DCIS, grade 2, negative margins  -Recurrence risk calculators were previously reviewed, she opted for radiation which she began 9/1 and will complete next week -Discussed the option of adjuvant Tamoxifen for further risk reduction, she is interested. I reviewed potential risk/benefit and SEs, she is interested. Plan to begin after she completes radiation -F/up in 4 weeks to begin low dose tamoxifen 5 mg po daily x3 years  Osteoporosis  -On raloxifene -Can stop when she starts Tamoxifen, I recommend starting calcium/vit D.   PLAN: -Surgical path reviewed -Complete adjuvant radiation 9/25 -Lab and f/up in 4 weeks to begin adjuvant Tamoxifen 5 mg po daily x3 years   Orders Placed This Encounter  Procedures   CBC with Differential (Cancer Center Only)    Standing Status:   Future    Expiration Date:   07/23/2025   CMP (Cancer Center only)    Standing Status:   Future    Expiration Date:   07/23/2025      All questions were answered. The patient knows to call the clinic with any problems, questions or concerns. No barriers to learning were detected. I spent 20 minutes counseling the patient face to face. The total time spent in the appointment was 30 minutes and more than 50% was on counseling, review of test results, and coordination of care.   Mandeep Kiser K Lorey Pallett, NP 07/23/2024

## 2024-07-26 ENCOUNTER — Ambulatory Visit
Admission: RE | Admit: 2024-07-26 | Discharge: 2024-07-26 | Disposition: A | Discharge status: Home / Self Care | Source: Ambulatory Visit | Attending: Radiation Oncology | Admitting: Radiation Oncology

## 2024-07-26 ENCOUNTER — Ambulatory Visit: Admitting: Hematology

## 2024-07-26 ENCOUNTER — Telehealth: Payer: Self-pay | Admitting: Adult Health

## 2024-07-26 ENCOUNTER — Other Ambulatory Visit: Payer: Self-pay

## 2024-07-26 DIAGNOSIS — Z510 Encounter for antineoplastic radiation therapy: Secondary | ICD-10-CM | POA: Diagnosis not present

## 2024-07-26 DIAGNOSIS — D0511 Intraductal carcinoma in situ of right breast: Secondary | ICD-10-CM | POA: Diagnosis not present

## 2024-07-26 DIAGNOSIS — Z170 Estrogen receptor positive status [ER+]: Secondary | ICD-10-CM | POA: Diagnosis not present

## 2024-07-26 LAB — RAD ONC ARIA SESSION SUMMARY
Course Elapsed Days: 18
Plan Fractions Treated to Date: 13
Plan Prescribed Dose Per Fraction: 2.67 Gy
Plan Total Fractions Prescribed: 16
Plan Total Prescribed Dose: 42.72 Gy
Reference Point Dosage Given to Date: 34.71 Gy
Reference Point Session Dosage Given: 2.67 Gy
Session Number: 13

## 2024-07-26 NOTE — Telephone Encounter (Signed)
 LVM  regarding pt appton Nov 17, 2024

## 2024-07-27 ENCOUNTER — Ambulatory Visit

## 2024-07-28 ENCOUNTER — Other Ambulatory Visit: Payer: Self-pay

## 2024-07-28 ENCOUNTER — Ambulatory Visit
Admission: RE | Admit: 2024-07-28 | Discharge: 2024-07-28 | Discharge status: Home / Self Care | Source: Ambulatory Visit | Attending: Radiation Oncology | Admitting: Radiation Oncology

## 2024-07-28 ENCOUNTER — Ambulatory Visit
Admission: RE | Admit: 2024-07-28 | Discharge: 2024-07-28 | Disposition: A | Discharge status: Home / Self Care | Source: Ambulatory Visit | Attending: Radiation Oncology | Admitting: Radiation Oncology

## 2024-07-28 DIAGNOSIS — Z510 Encounter for antineoplastic radiation therapy: Secondary | ICD-10-CM | POA: Diagnosis not present

## 2024-07-28 DIAGNOSIS — D0511 Intraductal carcinoma in situ of right breast: Secondary | ICD-10-CM | POA: Diagnosis not present

## 2024-07-28 DIAGNOSIS — Z170 Estrogen receptor positive status [ER+]: Secondary | ICD-10-CM | POA: Diagnosis not present

## 2024-07-28 LAB — RAD ONC ARIA SESSION SUMMARY
Course Elapsed Days: 20
Plan Fractions Treated to Date: 14
Plan Prescribed Dose Per Fraction: 2.67 Gy
Plan Total Fractions Prescribed: 16
Plan Total Prescribed Dose: 42.72 Gy
Reference Point Dosage Given to Date: 37.38 Gy
Reference Point Session Dosage Given: 2.67 Gy
Session Number: 14

## 2024-07-29 ENCOUNTER — Ambulatory Visit

## 2024-07-29 ENCOUNTER — Ambulatory Visit
Admission: RE | Admit: 2024-07-29 | Discharge: 2024-07-29 | Disposition: A | Discharge status: Home / Self Care | Source: Ambulatory Visit | Attending: Radiation Oncology | Admitting: Radiation Oncology

## 2024-07-29 ENCOUNTER — Other Ambulatory Visit: Payer: Self-pay

## 2024-07-29 DIAGNOSIS — Z170 Estrogen receptor positive status [ER+]: Secondary | ICD-10-CM | POA: Diagnosis not present

## 2024-07-29 DIAGNOSIS — D0511 Intraductal carcinoma in situ of right breast: Secondary | ICD-10-CM | POA: Diagnosis not present

## 2024-07-29 DIAGNOSIS — Z510 Encounter for antineoplastic radiation therapy: Secondary | ICD-10-CM | POA: Diagnosis not present

## 2024-07-29 LAB — RAD ONC ARIA SESSION SUMMARY
Course Elapsed Days: 21
Plan Fractions Treated to Date: 15
Plan Prescribed Dose Per Fraction: 2.67 Gy
Plan Total Fractions Prescribed: 16
Plan Total Prescribed Dose: 42.72 Gy
Reference Point Dosage Given to Date: 40.05 Gy
Reference Point Session Dosage Given: 2.67 Gy
Session Number: 15

## 2024-07-30 ENCOUNTER — Other Ambulatory Visit: Payer: Self-pay

## 2024-07-30 ENCOUNTER — Ambulatory Visit
Admission: RE | Admit: 2024-07-30 | Discharge: 2024-07-30 | Disposition: A | Discharge status: Home / Self Care | Source: Ambulatory Visit | Attending: Radiation Oncology | Admitting: Radiation Oncology

## 2024-07-30 DIAGNOSIS — Z510 Encounter for antineoplastic radiation therapy: Secondary | ICD-10-CM | POA: Diagnosis not present

## 2024-07-30 DIAGNOSIS — Z170 Estrogen receptor positive status [ER+]: Secondary | ICD-10-CM | POA: Diagnosis not present

## 2024-07-30 DIAGNOSIS — D0511 Intraductal carcinoma in situ of right breast: Secondary | ICD-10-CM | POA: Diagnosis not present

## 2024-07-30 LAB — RAD ONC ARIA SESSION SUMMARY
Course Elapsed Days: 22
Plan Fractions Treated to Date: 16
Plan Prescribed Dose Per Fraction: 2.67 Gy
Plan Total Fractions Prescribed: 16
Plan Total Prescribed Dose: 42.72 Gy
Reference Point Dosage Given to Date: 42.72 Gy
Reference Point Session Dosage Given: 2.67 Gy
Session Number: 16

## 2024-08-02 NOTE — Radiation Completion Notes (Signed)
  Radiation Oncology         [phone] ________________________________  Name: Jennifer Tyler MRN: [id number]  Date of Service: 07/30/2024  DOB: 28-Jun-1951  End of Treatment Note  Diagnosis: Stage 0 (cTis (DCIS), cN0, cM0) Right Breast, Intermediate grade DCIS, ER+ / PR+ / Her2 not assessed: s/p right breast lumpectomy  Intent: Curative     ==========DELIVERED PLANS==========  First Treatment Date: 2024-07-08 Last Treatment Date: 2024-07-30   Plan Name: Breast_R Site: Breast, Right Technique: 3D Mode: Photon Dose Per Fraction: 2.67 Gy Prescribed Dose (Delivered / Prescribed): 42.72 Gy / 42.72 Gy Prescribed Fxs (Delivered / Prescribed): 16 / 16     ====================================   The patient tolerated radiation. She developed anticipated skin changes in the treatment field and noted intermittent sharp shooting pains throughout her treatment.   The patient will return in one month and will continue follow up with Dr. Lanny as well.      Ronita Due, PA-C

## 2024-08-13 NOTE — Assessment & Plan Note (Signed)
 ER+/PR+ -Identified on routine mammogram.  - S/p right lumpectomy 06/01/2024 Dr. Ebbie, path showed DCIS, grade 2, negative margins  -s/p adjuvant radiation - She started adjuvant tamoxifen in October 2025.

## 2024-08-16 ENCOUNTER — Inpatient Hospital Stay: Admitting: Hematology

## 2024-08-16 ENCOUNTER — Inpatient Hospital Stay: Attending: Hematology

## 2024-08-16 VITALS — BP 120/80 | HR 66 | Temp 98.0°F | Resp 17 | Wt 199.4 lb

## 2024-08-16 DIAGNOSIS — Z1721 Progesterone receptor positive status: Secondary | ICD-10-CM | POA: Diagnosis not present

## 2024-08-16 DIAGNOSIS — Z923 Personal history of irradiation: Secondary | ICD-10-CM | POA: Insufficient documentation

## 2024-08-16 DIAGNOSIS — Z170 Estrogen receptor positive status [ER+]: Secondary | ICD-10-CM | POA: Insufficient documentation

## 2024-08-16 DIAGNOSIS — D0511 Intraductal carcinoma in situ of right breast: Secondary | ICD-10-CM | POA: Diagnosis not present

## 2024-08-16 DIAGNOSIS — Z79810 Long term (current) use of selective estrogen receptor modulators (SERMs): Secondary | ICD-10-CM | POA: Diagnosis not present

## 2024-08-16 DIAGNOSIS — M810 Age-related osteoporosis without current pathological fracture: Secondary | ICD-10-CM | POA: Diagnosis not present

## 2024-08-16 LAB — CMP (CANCER CENTER ONLY)
ALT: 14 U/L (ref 0–44)
AST: 17 U/L (ref 15–41)
Albumin: 4.1 g/dL (ref 3.5–5.0)
Alkaline Phosphatase: 65 U/L (ref 38–126)
Anion gap: 6 (ref 5–15)
BUN: 8 mg/dL (ref 8–23)
CO2: 32 mmol/L (ref 22–32)
Calcium: 9.9 mg/dL (ref 8.9–10.3)
Chloride: 102 mmol/L (ref 98–111)
Creatinine: 0.6 mg/dL (ref 0.44–1.00)
GFR, Estimated: >60 mL/min (ref >60)
Glucose, Bld: 86 mg/dL (ref 70–99)
Potassium: 3.4 mmol/L — ABNORMAL LOW (ref 3.5–5.1)
Sodium: 140 mmol/L (ref 135–145)
Total Bilirubin: 0.5 mg/dL (ref 0.0–1.2)
Total Protein: 6.9 g/dL (ref 6.5–8.1)

## 2024-08-16 LAB — CBC WITH DIFFERENTIAL (CANCER CENTER ONLY)
Abs Immature Granulocytes: 0.01 K/uL (ref 0.00–0.07)
Basophils Absolute: 0 K/uL (ref 0.0–0.1)
Basophils Relative: 1 %
Eosinophils Absolute: 0.1 K/uL (ref 0.0–0.5)
Eosinophils Relative: 1 %
HCT: 42.1 % (ref 36.0–46.0)
Hemoglobin: 13.8 g/dL (ref 12.0–15.0)
Immature Granulocytes: 0 %
Lymphocytes Relative: 23 %
Lymphs Abs: 1 K/uL (ref 0.7–4.0)
MCH: 28.2 pg (ref 26.0–34.0)
MCHC: 32.8 g/dL (ref 30.0–36.0)
MCV: 86.1 fL (ref 80.0–100.0)
Monocytes Absolute: 0.5 K/uL (ref 0.1–1.0)
Monocytes Relative: 12 %
Neutro Abs: 2.7 K/uL (ref 1.7–7.7)
Neutrophils Relative %: 63 %
Platelet Count: 229 K/uL (ref 150–400)
RBC: 4.89 MIL/uL (ref 3.87–5.11)
RDW: 13.2 % (ref 11.5–15.5)
WBC Count: 4.3 K/uL (ref 4.0–10.5)
nRBC: 0 % (ref 0.0–0.2)

## 2024-08-16 MED ORDER — TAMOXIFEN CITRATE 10 MG PO TABS: 5.0000 mg | ORAL_TABLET | Freq: Every day | ORAL | 2 refills | Status: AC

## 2024-08-16 NOTE — Progress Notes (Signed)
 [facility]   [phone] [phone]   Clinic Follow up Note   Patient Care Team: Okey [name] Redbird, MD as PCP - General (Family Medicine) Tyree Nanetta SAILOR, RN as Oncology Nurse Navigator Gerome, Devere HERO, RN as Oncology Nurse Navigator Ebbie Cough, MD as Consulting Physician (General Surgery) Lanny Callander, MD as Consulting Physician (Hematology) Shannon Agent, MD as Consulting Physician (Radiation Oncology)  Date of Service:  08/16/2024  CHIEF COMPLAINT: f/u of right breast DCIS  CURRENT THERAPY:  Pending tamoxifen  Oncology History   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  - S/p right lumpectomy 06/01/2024 Dr. Ebbie, path showed DCIS, grade 2, negative margins  -s/p adjuvant radiation - She started adjuvant tamoxifen in October 2025.  Assessment & Plan Ductal carcinoma in situ of right breast, post-treatment surveillance Completed radiation therapy 2-3 weeks ago with good recovery. Skin discoloration and peeling are normal post-radiation effects. - Initiate tamoxifen 5 mg daily to prevent future breast cancer, associated with fewer side effects compared to the standard 20 mg dose for invasive cancer. Potential side effects include hot flashes and minimal risk of hair loss. - Discontinue raloxifene when starting tamoxifen. - Prescribe tamoxifen 5 mg daily, instruct to cut the pill in half. - Call in prescription to [facility] on [facility] [address]. - Schedule follow-up with Armida in 3 months to monitor response to tamoxifen. - Schedule follow-up in 6 months for continued surveillance.  Osteoporosis of lumbar spine Osteoporosis of the lumbar spine with a T-score of -3.3, consistent with previous measurements. Recent bone density scan in July 2023 showed well-managed results. - Recommend calcium 600 mg daily. - Recommend vitamin D  [phone] IU daily. - Consider additional  vitamin D  supplementation or multivitamin if needed.  Plan - She has recovered well from radiation - I called in tamoxifen 5 mg daily for her - She will stop raloxifene - Survivorship in 3 months - Lab and follow-up with me in 9 months   SUMMARY OF ONCOLOGIC HISTORY: Oncology History  Ductal carcinoma in situ (DCIS) of right breast  05/03/2024 Cancer Staging   Staging form: Breast, AJCC 8th Edition - Clinical stage from 05/03/2024: Stage 0 (cTis (DCIS), cN0, cM0, G2, ER+, PR+) - Signed by Lanny Callander, MD on 05/11/2024 Stage prefix: Initial diagnosis Histologic grading system: 3 grade system   05/10/2024 Initial Diagnosis   Ductal carcinoma in situ (DCIS) of right breast      Discussed the use of AI scribe software for clinical note transcription with the patient, who gave verbal consent to proceed.  History of Present Illness Jennifer Tyler is a 73 year old female with ductal carcinoma in situ who presents for follow-up after completing radiation therapy.  She completed radiation therapy approximately two to three weeks ago. The skin in the treated area remains dark and is peeling. She has stopped raloxifene after radiation therapy. She takes calcium at 600 mg and vitamin D  at 800 to 1000 units daily. A bone density test in July 2025 showed a lumbar spine T-score of -3.3, similar to previous results. Her hair is thinning, which she attributes to age and low estrogen levels.     All other systems were reviewed with the patient and are negative.  MEDICAL HISTORY:  Past Medical History:  Diagnosis Date   Acid reflux    Back pain    Diverticulosis    Gallbladder problem    High cholesterol  Hypertension    Osteoporosis    Swelling     SURGICAL HISTORY: Past Surgical History:  Procedure Laterality Date   ABDOMINAL HYSTERECTOMY     BREAST BIOPSY     BREAST BIOPSY Right 05/03/2024   MM RT BREAST BX W LOC DEV 1ST LESION IMAGE BX SPEC STEREO GUIDE 05/03/2024 [facility]    BREAST BIOPSY  05/28/2024   MM RT RADIOACTIVE SEED LOC MAMMO GUIDE 05/28/2024 [facility]   BREAST LUMPECTOMY WITH RADIOACTIVE SEED LOCALIZATION Right 06/01/2024   Procedure: BREAST LUMPECTOMY WITH RADIOACTIVE SEED LOCALIZATION;  Surgeon: Ebbie Cough, MD;  Location: [facility];  Service: General;  Laterality: Right;  RIGHT BREAST SEED GUIDED LUMPECTOMY   BUNIONECTOMY  2016   R   CHOLECYSTECTOMY      I have reviewed the social history and family history with the patient and they are unchanged from previous note.  ALLERGIES:  is allergic to lisinopril and hydrocodone-acetaminophen .  MEDICATIONS:  Current Outpatient Medications  Medication Sig Dispense Refill   buPROPion  (WELLBUTRIN  SR) 200 MG 12 hr tablet Take 1 tablet (200 mg total) by mouth 2 (two) times daily. 180 tablet 1   hydrochlorothiazide (HYDRODIURIL) 25 MG tablet Take 25 mg by mouth daily.     losartan (COZAAR) 25 MG tablet Take 25 mg by mouth daily.     naproxen sodium (ALEVE) 220 MG tablet Take 220 mg by mouth.     pantoprazole (PROTONIX) 40 MG tablet Take 40 mg by mouth daily.     tamoxifen (NOLVADEX) 10 MG tablet Take 0.5 tablets (5 mg total) by mouth daily. 30 tablet 2   No current facility-administered medications for this visit.    PHYSICAL EXAMINATION: ECOG PERFORMANCE STATUS: 0 - Asymptomatic  Vitals:   08/16/24 1019  BP: 120/80  Pulse: 66  Resp: 17  Temp: 98 F (36.7 C)  SpO2: 99%   Wt Readings from Last 3 Encounters:  08/16/24 199 lb 6.4 oz (90.4 kg)  07/23/24 197 lb 8 oz (89.6 kg)  06/28/24 196 lb 12.8 oz (89.3 kg)     GENERAL:alert, no distress and comfortable SKIN: skin color, texture, turgor are normal, no rashes or significant lesions EYES: normal, Conjunctiva are pink and non-injected, sclera clear Musculoskeletal:no cyanosis of digits and no clubbing  NEURO: alert & oriented x 3 with fluent speech, no focal motor/sensory deficits  Physical Exam    LABORATORY  DATA:  I have reviewed the data as listed    Latest Ref Rng & Units 08/16/2024    9:42 AM 05/12/2024   12:02 PM 11/11/2019    4:47 PM  CBC  WBC 4.0 - 10.5 K/uL 4.3  5.0  4.9   Hemoglobin 12.0 - 15.0 g/dL 86.1  86.6  84.7   Hematocrit 36.0 - 46.0 % 42.1  41.7  44.7   Platelets 150 - 400 K/uL 229  256  278         Latest Ref Rng & Units 08/16/2024    9:42 AM 05/12/2024   12:02 PM 09/20/2021   11:13 AM  CMP  Glucose 70 - 99 mg/dL 86  81  86   BUN 8 - 23 mg/dL 8  13  11    Creatinine 0.44 - 1.00 mg/dL 9.39  9.31  9.42   Sodium 135 - 145 mmol/L 140  140  134   Potassium 3.5 - 5.1 mmol/L 3.4  3.6  4.2   Chloride 98 - 111 mmol/L 102  104  94  CO2 22 - 32 mmol/L 32  31  28   Calcium 8.9 - 10.3 mg/dL 9.9  9.5  9.4   Total Protein 6.5 - 8.1 g/dL 6.9  6.9  6.6   Total Bilirubin 0.0 - 1.2 mg/dL 0.5  0.5  0.4   Alkaline Phos 38 - 126 U/L 65  71  86   AST 15 - 41 U/L 17  17  19    ALT 0 - 44 U/L 14  14  17        RADIOGRAPHIC STUDIES: I have personally reviewed the radiological images as listed and agreed with the findings in the report. No results found.    No orders of the defined types were placed in this encounter.  All questions were answered. The patient knows to call the clinic with any problems, questions or concerns. No barriers to learning was detected. The total time spent in the appointment was 15 minutes, including review of chart and various tests results, discussions about plan of care and coordination of care plan     Onita Mattock, MD 08/16/2024

## 2024-08-27 ENCOUNTER — Encounter: Payer: Self-pay | Admitting: Radiation Oncology

## 2024-08-29 NOTE — Progress Notes (Signed)
 Radiation Oncology         [phone] ________________________________  Name: Jennifer Tyler MRN: [id number]  Date: 08/30/2024  DOB: Feb 08, 1951  Follow-Up Visit Note  CC: Okey [name] Redbird, MD  Okey [name] Redbird, MD  No diagnosis found.  Diagnosis: Stage 0 (cTis (DCIS), cN0, cM0) Right Breast, Intermediate grade DCIS, ER+ / PR+ / Her2 not assessed: s/p right breast lumpectomy   Interval Since Last Radiation: 1 month and 1 day   Intent: Curative  Radiation Treatment Dates: First Treatment Date: 2024-07-08 -- Last Treatment Date: 2024-07-30 Site/Dose/Technique/Mode:  Plan Name: Breast_R Site: Breast, Right Technique: 3D Mode: Photon Dose Per Fraction: 2.67 Gy Prescribed Dose (Delivered / Prescribed): 42.72 Gy / 42.72 Gy Prescribed Fxs (Delivered / Prescribed): 16 / 16  Narrative:  The patient returns today for routine follow-up. She tolerated radiation therapy relatively well other than anticipated skin changes in the treatment field and intermittent sharp shooting pains throughout her treatment.         Since completing radiation therapy, she was seen by Dr. Lanny for a routine follow-up visit on 08/16/24. She was noted to be recovering well from radiation therapy at that time. She also agreed to proceed with antiestrogen therapy consisting of tamoxifen. (Dr. Lanny has informed her that she will need to stop taking raloxifene while on tamoxifen).         No other significant interval history since she completed radiation therapy.   ***           Allergies:  is allergic to lisinopril and hydrocodone-acetaminophen .  Meds: Current Outpatient Medications  Medication Sig Dispense Refill   buPROPion  (WELLBUTRIN  SR) 200 MG 12 hr tablet Take 1 tablet (200 mg total) by mouth 2 (two) times daily. 180 tablet 1   hydrochlorothiazide (HYDRODIURIL) 25 MG tablet Take 25 mg by mouth daily.     losartan (COZAAR) 25 MG tablet Take 25 mg by mouth daily.     naproxen sodium (ALEVE) 220 MG  tablet Take 220 mg by mouth.     pantoprazole (PROTONIX) 40 MG tablet Take 40 mg by mouth daily.     tamoxifen (NOLVADEX) 10 MG tablet Take 0.5 tablets (5 mg total) by mouth daily. 30 tablet 2   No current facility-administered medications for this encounter.    Physical Findings: The patient is in no acute distress. Patient is alert and oriented.  vitals were not taken for this visit. .  No significant changes. Lungs are clear to auscultation bilaterally. Heart has regular rate and rhythm. No palpable cervical, supraclavicular, or axillary adenopathy. Abdomen soft, non-tender, normal bowel sounds.  Left Breast: no palpable mass, nipple discharge or bleeding. Right Breast: ***  Lab Findings: Lab Results  Component Value Date   WBC 4.3 08/16/2024   HGB 13.8 08/16/2024   HCT 42.1 08/16/2024   MCV 86.1 08/16/2024   PLT 229 08/16/2024    Radiographic Findings: No results found.  Impression: Stage 0 (cTis (DCIS), cN0, cM0) Right Breast, Intermediate grade DCIS, ER+ / PR+ / Her2 not assessed: s/p right breast lumpectomy   The patient is recovering from the effects of radiation.  ***  Plan:  ***   *** minutes of total time was spent for this patient encounter, including preparation, face-to-face counseling with the patient and coordination of care, physical exam, and documentation of the encounter. ____________________________________  Lynwood CHARM Nasuti, PhD, MD  This document serves as a record of services personally performed by Lynwood Nasuti, MD. It was created  on his behalf by Dorthy Fuse, a trained medical scribe. The creation of this record is based on the scribe's personal observations and the provider's statements to them. This document has been checked and approved by the attending provider.

## 2024-08-30 ENCOUNTER — Ambulatory Visit
Admission: RE | Admit: 2024-08-30 | Discharge: 2024-08-30 | Disposition: A | Discharge status: Home / Self Care | Source: Ambulatory Visit | Attending: Radiation Oncology | Admitting: Radiation Oncology

## 2024-08-30 ENCOUNTER — Encounter: Payer: Self-pay | Admitting: Radiation Oncology

## 2024-08-30 VITALS — BP 146/86 | HR 73 | Temp 97.9°F | Resp 20 | Ht 59.0 in | Wt 197.8 lb

## 2024-08-30 DIAGNOSIS — D0511 Intraductal carcinoma in situ of right breast: Secondary | ICD-10-CM

## 2024-08-30 NOTE — Progress Notes (Signed)
 Jennifer Tyler is here today for follow up post radiation to the breast.   Breast Side:Right Breast   They completed their radiation on: 07/30/2024  Does the patient complain of any of the following: Post radiation skin issues: Denies Breast Tenderness: Denies Breast Swelling: Denies Lymphadema: Denies Range of Motion limitations: Denies Fatigue post radiation: Denies Appetite good/fair/poor: Good Energy level: Good  Additional comments if applicable: She was asking if it was safe to get the flu shot BP (!) 155/95 (BP Location: Right Arm, Patient Position: Sitting, Cuff Size: Large)   Pulse 73   Temp 97.9 F (36.6 C)   Resp 20   Ht 4' 11 (1.499 m)   Wt 197 lb 12.8 oz (89.7 kg)   SpO2 100%   BMI 39.95 kg/m

## 2024-10-08 ENCOUNTER — Other Ambulatory Visit (HOSPITAL_BASED_OUTPATIENT_CLINIC_OR_DEPARTMENT_OTHER): Payer: Self-pay | Admitting: Family Medicine

## 2024-10-08 DIAGNOSIS — E7800 Pure hypercholesterolemia, unspecified: Secondary | ICD-10-CM

## 2024-10-25 ENCOUNTER — Ambulatory Visit (HOSPITAL_BASED_OUTPATIENT_CLINIC_OR_DEPARTMENT_OTHER)
Admission: RE | Admit: 2024-10-25 | Discharge: 2024-10-25 | Disposition: A | Discharge status: Home / Self Care | Payer: Self-pay | Source: Ambulatory Visit | Attending: Family Medicine | Admitting: Family Medicine

## 2024-10-25 DIAGNOSIS — E7800 Pure hypercholesterolemia, unspecified: Secondary | ICD-10-CM | POA: Insufficient documentation

## 2024-11-17 ENCOUNTER — Inpatient Hospital Stay: Attending: Hematology | Admitting: Adult Health

## 2024-11-17 ENCOUNTER — Encounter: Payer: Self-pay | Admitting: Adult Health

## 2024-11-17 VITALS — BP 152/70 | HR 66 | Temp 97.0°F | Resp 17 | Wt 208.6 lb

## 2024-11-17 DIAGNOSIS — Z170 Estrogen receptor positive status [ER+]: Secondary | ICD-10-CM | POA: Diagnosis not present

## 2024-11-17 DIAGNOSIS — Z803 Family history of malignant neoplasm of breast: Secondary | ICD-10-CM | POA: Insufficient documentation

## 2024-11-17 DIAGNOSIS — Z806 Family history of leukemia: Secondary | ICD-10-CM | POA: Diagnosis not present

## 2024-11-17 DIAGNOSIS — Z79810 Long term (current) use of selective estrogen receptor modulators (SERMs): Secondary | ICD-10-CM | POA: Insufficient documentation

## 2024-11-17 DIAGNOSIS — Z1721 Progesterone receptor positive status: Secondary | ICD-10-CM | POA: Insufficient documentation

## 2024-11-17 DIAGNOSIS — D0511 Intraductal carcinoma in situ of right breast: Secondary | ICD-10-CM | POA: Diagnosis not present

## 2024-11-17 DIAGNOSIS — Z923 Personal history of irradiation: Secondary | ICD-10-CM | POA: Insufficient documentation

## 2024-11-17 NOTE — Progress Notes (Signed)
 SURVIVORSHIP VISIT:  BRIEF ONCOLOGIC HISTORY:  Oncology History  Ductal carcinoma in situ (DCIS) of right breast  05/03/2024 Cancer Staging   Staging form: Breast, AJCC 8th Edition - Clinical stage from 05/03/2024: Stage 0 (cTis (DCIS), cN0, cM0, G2, ER+, PR+) - Signed by Lanny Callander, MD on 05/11/2024 Stage prefix: Initial diagnosis Histologic grading system: 3 grade system   05/03/2024 Pathology Results   RIGHT BREAST BIOPSY: intermediate grade DCIS; ER/PR+   05/10/2024 Initial Diagnosis   Ductal carcinoma in situ (DCIS) of right breast   06/01/2024 Surgery   RIGHT LUMPECTOMY: DCIS; ER/PR+; no lymph nodes submitted; margins negative   07/08/2024 - 07/30/2024 Radiation Therapy   Plan Name: Breast_R Site: Breast, Right Technique: 3D Mode: Photon Dose Per Fraction: 2.67 Gy Prescribed Dose (Delivered / Prescribed): 42.72 Gy / 42.72 Gy Prescribed Fxs (Delivered / Prescribed): 16 / 16   08/2024 -  Anti-estrogen oral therapy   Tamoxifen      INTERVAL HISTORY:  Jennifer Tyler to review her survivorship care plan detailing her treatment course for breast cancer, as well as monitoring long-term side effects of that treatment, education regarding health maintenance, screening, and overall wellness and health promotion.     Overall, Jennifer Tyler reports feeling moderately well.  She is taking tamoxifen  daily  REVIEW OF SYSTEMS:  Review of Systems  Constitutional:  Negative for appetite change, chills, fatigue, fever and unexpected weight change.  HENT:   Negative for hearing loss, lump/mass and trouble swallowing.   Eyes:  Negative for eye problems and icterus.  Respiratory:  Negative for chest tightness, cough and shortness of breath.   Cardiovascular:  Negative for chest pain, leg swelling and palpitations.  Gastrointestinal:  Negative for abdominal distention, abdominal pain, constipation, diarrhea, nausea and vomiting.  Endocrine: Negative for hot flashes.  Genitourinary:  Negative for difficulty  urinating.   Musculoskeletal:  Negative for arthralgias.  Skin:  Negative for itching and rash.  Neurological:  Negative for dizziness, extremity weakness, headaches and numbness.  Hematological:  Negative for adenopathy. Does not bruise/bleed easily.  Psychiatric/Behavioral:  Negative for depression. The patient is not nervous/anxious.        PAST MEDICAL/SURGICAL HISTORY:  Past Medical History:  Diagnosis Date   Acid reflux    Back pain    Diverticulosis    Gallbladder problem    High cholesterol    History of radiation therapy    Right breast-07/08/24-07/30/24- Dr. Lynwood Nasuti   Hypertension    Osteoporosis    Swelling    Past Surgical History:  Procedure Laterality Date   ABDOMINAL HYSTERECTOMY     BREAST BIOPSY     BREAST BIOPSY Right 05/03/2024   MM RT BREAST BX W LOC DEV 1ST LESION IMAGE BX SPEC STEREO GUIDE 05/03/2024 [facility]   BREAST BIOPSY  05/28/2024   MM RT RADIOACTIVE SEED LOC MAMMO GUIDE 05/28/2024 [facility]   BREAST LUMPECTOMY WITH RADIOACTIVE SEED LOCALIZATION Right 06/01/2024   Procedure: BREAST LUMPECTOMY WITH RADIOACTIVE SEED LOCALIZATION;  Surgeon: Ebbie Cough, MD;  Location: [facility];  Service: General;  Laterality: Right;  RIGHT BREAST SEED GUIDED LUMPECTOMY   BUNIONECTOMY  2016   R   CHOLECYSTECTOMY       ALLERGIES:  Allergies[1]   CURRENT MEDICATIONS:  Outpatient Encounter Medications as of 11/17/2024  Medication Sig   buPROPion  (WELLBUTRIN  SR) 200 MG 12 hr tablet Take 1 tablet (200 mg total) by mouth 2 (two) times daily.   hydrochlorothiazide (HYDRODIURIL) 25 MG tablet  Take 25 mg by mouth daily.   losartan (COZAAR) 25 MG tablet Take 25 mg by mouth daily.   naproxen sodium (ALEVE) 220 MG tablet Take 220 mg by mouth.   pantoprazole (PROTONIX) 40 MG tablet Take 40 mg by mouth daily.   tamoxifen  (NOLVADEX ) 10 MG tablet Take 0.5 tablets (5 mg total) by mouth daily.   No facility-administered encounter  medications on file as of 11/17/2024.     ONCOLOGIC FAMILY HISTORY:  Family History  Problem Relation Age of Onset   Heart disease Mother    Cancer Sister        breast   Breast cancer Sister 57   Leukemia Maternal Grandmother    Breast cancer Maternal Cousin        first cousin   Leukemia Maternal Cousin        first cousin     SOCIAL HISTORY:  Social History   Socioeconomic History   Marital status: Married    Spouse name: Augusto   Number of children: 2   Years of education: Not on file   Highest education level: Not on file  Occupational History   Occupation: Retired  Tobacco Use   Smoking status: Never   Smokeless tobacco: Never  Vaping Use   Vaping status: Never Used  Substance and Sexual Activity   Alcohol use: Not Currently    Comment: Sociial drinker   Drug use: Never   Sexual activity: Not on file  Other Topics Concern   Not on file  Social History Narrative   Not on file   Social Drivers of Health   Tobacco Use: Low Risk (11/17/2024)   Patient History    Smoking Tobacco Use: Never    Smokeless Tobacco Use: Never    Passive Exposure: Not on file  Financial Resource Strain: Low Risk (11/17/2024)   Overall Financial Resource Strain (CARDIA)    Difficulty of Paying Living Expenses: Not hard at all  Food Insecurity: No Food Insecurity (11/17/2024)   Epic    Worried About Programme Researcher, Broadcasting/film/video in the Last Year: Never true    Ran Out of Food in the Last Year: Never true  Transportation Needs: No Transportation Needs (11/17/2024)   Epic    Lack of Transportation (Medical): No    Lack of Transportation (Non-Medical): No  Physical Activity: Inactive (11/17/2024)   Exercise Vital Sign    Days of Exercise per Week: 0 days    Minutes of Exercise per Session: 0 min  Stress: No Stress Concern Present (11/17/2024)   [employer] of Occupational Health - Occupational Stress Questionnaire    Feeling of Stress: Not at all  Social Connections: Moderately  Integrated (11/17/2024)   Social Connection and Isolation Panel    Frequency of Communication with Friends and Family: More than three times a week    Frequency of Social Gatherings with Friends and Family: Twice a week    Attends Religious Services: More than 4 times per year    Active Member of [employer] or Organizations: No    Attends Banker Meetings: Never    Marital Status: Married  Catering Manager Violence: Not At Risk (11/17/2024)   Epic    Fear of Current or Ex-Partner: No    Emotionally Abused: No    Physically Abused: No    Sexually Abused: No  Depression (PHQ2-9): Low Risk (11/17/2024)   Depression (PHQ2-9)    PHQ-2 Score: 0  Alcohol Screen: Low Risk (11/17/2024)   Alcohol  Screen    Last Alcohol Screening Score (AUDIT): 1  Housing: Unknown (11/17/2024)   Epic    Unable to Pay for Housing in the Last Year: No    Number of Times Moved in the Last Year: Not on file    Homeless in the Last Year: No  Utilities: Not At Risk (11/17/2024)   Epic    Threatened with loss of utilities: No  Health Literacy: Adequate Health Literacy (11/17/2024)   B1300 Health Literacy    Frequency of need for help with medical instructions: Never     OBSERVATIONS/OBJECTIVE:  BP (!) 152/70 (BP Location: Left Arm, Patient Position: Sitting) Comment: 1st BP 159/81 right arm; 2nd BP 152/70 left arm  Pulse 66   Temp (!) 97 F (36.1 C) (Tympanic)   Resp 17   Wt 208 lb 9.6 oz (94.6 kg)   SpO2 100%   BMI 42.13 kg/m  GENERAL: Patient is a well appearing female in no acute distress HEENT:  Sclerae anicteric.  Oropharynx clear and moist. No ulcerations or evidence of oropharyngeal candidiasis. Neck is supple.  NODES:  No cervical, supraclavicular, or axillary lymphadenopathy palpated.  BREAST EXAM:  right breast s/p lumpectomy and radiation, no sign of local recurrence; left breast benign LUNGS:  Clear to auscultation bilaterally.  No wheezes or rhonchi. HEART:  Regular rate and rhythm. No  murmur appreciated. ABDOMEN:  Soft, nontender.  Positive, normoactive bowel sounds. No organomegaly palpated. MSK:  No focal spinal tenderness to palpation. Full range of motion bilaterally in the upper extremities. EXTREMITIES:  No peripheral edema.   SKIN:  Clear with no obvious rashes or skin changes. No nail dyscrasia. NEURO:  Nonfocal. Well oriented.  Appropriate affect.   LABORATORY DATA:  None for this visit.  DIAGNOSTIC IMAGING:  None for this visit.      ASSESSMENT AND PLAN:  Ms.. Jennifer Tyler is a pleasant 74 y.o. female with Stage 0 right breast DCIS, ER+/PR+, diagnosed in 04/2024, treated with lumpectomy, adjuvant radiation therapy, and anti-estrogen therapy with Tamoxifen  beginning in 08/2024.  She presents to the Survivorship Clinic for our initial meeting and routine follow-up post-completion of treatment for breast cancer.    1. Stage 0 left breast cancer:  Ms. Gauntt is continuing to recover from definitive treatment for breast cancer. She will follow-up with her medical oncologist, Dr.  Lanny in 7/2026with history and physical exam per surveillance protocol.  She will continue her anti-estrogen therapy with tamoxifen . Thus far, she is tolerating the tamoxifen  well, with minimal side effects. Her mammogram is due 03/2025; orders placed today.   Today, a comprehensive survivorship care plan and treatment summary was reviewed with the patient today detailing her breast cancer diagnosis, treatment course, potential late/long-term effects of treatment, appropriate follow-up care with recommendations for the future, and patient education resources.  A copy of this summary, along with a letter will be sent to the patients primary care provider via mail/fax/In Basket message after todays visit.    2. Bone health:  She was given education on specific activities to promote bone health.  3. Cancer screening:  Due to Ms. Mowbray history and her age, she should receive screening for skin cancers,  colon cancer, and gynecologic cancers.  The information and recommendations are listed on the patient's comprehensive care plan/treatment summary and were reviewed in detail with the patient.    4. Health maintenance and wellness promotion: Ms. Cassetta was encouraged to consume 5-7 servings of fruits and vegetables per day. We reviewed the  Nutrition Rainbow handout.  She was also encouraged to engage in moderate to vigorous exercise for 30 minutes per day most days of the week.  She was instructed to limit her alcohol consumption and continue to abstain from tobacco use.     5. Support services/counseling: It is not uncommon for this period of the patient's cancer care trajectory to be one of many emotions and stressors.   She was given information regarding our available services and encouraged to contact me with any questions or for help enrolling in any of our support group/programs.    Follow up instructions:    -Return to cancer center in 6 months for f/u with Dr. Lanny  -Mammogram due in 03/2025 -She is welcome to return back to the Survivorship Clinic at any time; no additional follow-up needed at this time.  -Consider referral back to survivorship as a long-term survivor for continued surveillance  The patient was provided an opportunity to ask questions and all were answered. The patient agreed with the plan and demonstrated an understanding of the instructions.   Total encounter time:30 minutes*in face-to-face visit time, chart review, lab review, care coordination, order entry, and documentation of the encounter time.    Morna Kendall, NP 11/17/2024 11:04 AM Medical Oncology and Hematology [facility] [address] [place], [place] [address] Tel. [phone]    Fax. [phone]  *Total Encounter Time as defined by the Centers for Medicare and Medicaid Services includes, in addition to the face-to-face time of a patient visit (documented in the note above)  non-face-to-face time: obtaining and reviewing outside history, ordering and reviewing medications, tests or procedures, care coordination (communications with other health care professionals or caregivers) and documentation in the medical record.     [1]  Allergies Allergen Reactions   Lisinopril Cough   Hydrocodone-Acetaminophen  Rash

## 2024-11-29 NOTE — Progress Notes (Unsigned)
" °  Cardiology Office Note:  .   Date:  11/29/2024 ID:  Jennifer Tyler, DOB 03-24-1951, MRN [id number] PCP: Okey [name] Redbird, MD [facility] Providers Cardiologist:  None { Click to update primary MD,subspecialty MD or APP then REFRESH:1}   Patient Profile: .      PMH Ductal carcinoma in right breast Obesity Hypertension Hyperlipidemia Coronary artery disease CT Calcium Score 10/25/2024 CAC 468 (92nd percentile) LM 0, LAD 102, LCx 184, RCA 183       History of Present Illness: .   Jennifer Tyler is a *** 74 y.o. female  who is here today for new patient consult for ***  Lipid panel 10/06/24 total cholesterol 245, HDL 67, triglyceride 119, LDL 157  Family history: Her family history includes Breast cancer in her maternal cousin; Breast cancer (age of onset: 72) in her sister; Cancer in her sister; Heart disease in her mother; Leukemia in her maternal cousin and maternal grandmother.   Discussed the use of AI scribe software for clinical note transcription with the patient, who gave verbal consent to proceed.  ASCVD Risk Score: ASCVD (Atherosclerotic Cardiovascular Disease) Risk Algorithm including Known ASCVD from AHA/ACC from [email or web address] on 11/29/2024 ** All calculations should be rechecked by clinician prior to use **  RESULT SUMMARY: 15.8 % Risk of cardiovascular event (coronary or stroke death or non-fatal MI or stroke) in next 10 years.  Moderate- to high-intensity statin recommended because 10-year risk >7.5%  To view statin dosages by intensity, see Evidence section.  INPUTS: History of ASCVD --> 0 = No LDL Cholesterol >=190mg /dL (5.07 mmol/L) --> 0 = No Age --> 73 years Diabetes --> 0 = No Sex --> 0 = Female Total Cholesterol --> 245 mg/dL HDL Cholesterol --> 67 mg/dL Systolic Blood Pressure --> 120 mm Hg Treatment for Hypertension --> 1 = Yes Smoker --> 0 = No Race --> 2 = African American   The ASCVD Risk score (Arnett DK, et al., 2019) failed to  calculate for the following reasons:   Cannot find a previous HDL lab   Cannot find a previous total cholesterol lab  {MD Calc ASCVD Calculator :1}  Diet:  Activity:   No results found for: LIPOA    ROS: ***       Studies Reviewed: .          *** Risk Assessment/Calculations:   {Does this patient have ATRIAL FIBRILLATION?:[id number]} No BP recorded.  {Refresh Note OR Click here to enter BP  :1}***       Physical Exam:   VS: There were no vitals taken for this visit.  Wt Readings from Last 3 Encounters:  11/17/24 208 lb 9.6 oz (94.6 kg)  08/30/24 197 lb 12.8 oz (89.7 kg)  08/16/24 199 lb 6.4 oz (90.4 kg)     GEN: Well nourished, well developed in no acute distress NECK: No JVD; No carotid bruits CARDIAC: ***RRR, no murmurs, rubs, gallops RESPIRATORY:  Clear to auscultation without rales, wheezing or rhonchi  ABDOMEN: Soft, non-tender, non-distended EXTREMITIES:  No edema; No deformity     ASSESSMENT AND PLAN: .     Plan/Goals:{ Click here to update goals :1}        {Are you ordering a CV Procedure (e.g. stress test, cath, DCCV, TEE, etc)?   Press F2        :[id number]}  Dispo: ***  Signed, Rosaline Bane, NP-C "

## 2024-12-01 ENCOUNTER — Institutional Professional Consult (permissible substitution) (HOSPITAL_BASED_OUTPATIENT_CLINIC_OR_DEPARTMENT_OTHER): Admitting: Nurse Practitioner

## 2024-12-13 ENCOUNTER — Institutional Professional Consult (permissible substitution) (HOSPITAL_BASED_OUTPATIENT_CLINIC_OR_DEPARTMENT_OTHER): Admitting: Nurse Practitioner

## 2025-03-30 ENCOUNTER — Encounter

## 2025-05-16 ENCOUNTER — Inpatient Hospital Stay: Admitting: Hematology

## 2025-05-16 ENCOUNTER — Inpatient Hospital Stay
